# Patient Record
Sex: Male | Born: 1955 | Race: Black or African American | Hispanic: No | Smoking: Former smoker
Health system: Southern US, Community
[De-identification: ages and names within clinical notes are randomized; demographics above are authoritative.]

## PROBLEM LIST (undated history)

## (undated) DIAGNOSIS — G8929 Other chronic pain: Secondary | ICD-10-CM

## (undated) DIAGNOSIS — M1990 Unspecified osteoarthritis, unspecified site: Secondary | ICD-10-CM

## (undated) DIAGNOSIS — M25569 Pain in unspecified knee: Secondary | ICD-10-CM

## (undated) DIAGNOSIS — E119 Type 2 diabetes mellitus without complications: Secondary | ICD-10-CM

## (undated) DIAGNOSIS — C801 Malignant (primary) neoplasm, unspecified: Secondary | ICD-10-CM

## (undated) DIAGNOSIS — M545 Low back pain, unspecified: Secondary | ICD-10-CM

## (undated) DIAGNOSIS — I10 Essential (primary) hypertension: Secondary | ICD-10-CM

## (undated) HISTORY — DX: Type 2 diabetes mellitus without complications: E11.9

## (undated) HISTORY — PX: JOINT REPLACEMENT: SHX530

---

## 2006-03-15 HISTORY — PX: KNEE SURGERY: SHX244

## 2006-05-17 ENCOUNTER — Ambulatory Visit (HOSPITAL_COMMUNITY): Admission: RE | Admit: 2006-05-17 | Discharge: 2006-05-17 | Payer: Self-pay | Admitting: Orthopedic Surgery

## 2007-02-28 ENCOUNTER — Emergency Department (HOSPITAL_COMMUNITY): Admission: EM | Admit: 2007-02-28 | Discharge: 2007-02-28 | Payer: Self-pay | Admitting: Emergency Medicine

## 2009-03-04 ENCOUNTER — Emergency Department (HOSPITAL_COMMUNITY): Admission: EM | Admit: 2009-03-04 | Discharge: 2009-03-04 | Payer: Self-pay | Admitting: Emergency Medicine

## 2009-11-11 ENCOUNTER — Emergency Department (HOSPITAL_COMMUNITY): Admission: EM | Admit: 2009-11-11 | Discharge: 2009-11-11 | Payer: Self-pay | Admitting: Family Medicine

## 2009-11-19 ENCOUNTER — Emergency Department (HOSPITAL_COMMUNITY): Admission: EM | Admit: 2009-11-19 | Discharge: 2009-11-19 | Payer: Self-pay | Admitting: Family Medicine

## 2009-11-27 ENCOUNTER — Ambulatory Visit (HOSPITAL_COMMUNITY): Admission: RE | Admit: 2009-11-27 | Discharge: 2009-11-27 | Payer: Self-pay | Admitting: Internal Medicine

## 2010-04-05 ENCOUNTER — Encounter: Payer: Self-pay | Admitting: Neurology

## 2010-04-05 ENCOUNTER — Encounter: Payer: Self-pay | Admitting: Orthopedic Surgery

## 2010-06-15 LAB — POCT I-STAT, CHEM 8
Chloride: 103 mEq/L (ref 96–112)
Creatinine, Ser: 1.1 mg/dL (ref 0.4–1.5)
Hemoglobin: 16 g/dL (ref 13.0–17.0)
TCO2: 30 mmol/L (ref 0–100)

## 2010-07-31 NOTE — Op Note (Signed)
Eric Macdonald, Eric Macdonald                ACCOUNT NO.:  [id number]   MEDICAL RECORD NO.:  [id number]          PATIENT TYPE:  AMB   LOCATION:  DAY                          FACILITY:  [facility]   PHYSICIAN:  Georges Lynch. Gioffre, M.D.DATE OF BIRTH:  Apr 14, 1955   DATE OF PROCEDURE:  05/17/2006  DATE OF DISCHARGE:                               OPERATIVE REPORT   SURGEON:  Dr. Darrelyn Hillock.   ASSISTANT:  Nurse.   PREOPERATIVE DIAGNOSES:  1. Degenerative arthritis, right knee.  2. 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   POSTOPERATIVE DIAGNOSES:  1. Degenerative arthritis, right knee.  2. 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   OPERATION:  1. Diagnostic arthroscopy, right knee.  2. Medial meniscectomy, right knee.  3. Synovectomy of suprapatellar pouch, right knee.   PROCEDURE:  Under general anesthesia, routine orthopedic prepping and  draping of the right lower extremity was carried out.  At this time, a  small punctate incision made in the suprapatellar pouch.  Inflow cannula  was inserted, and the knee was distended with saline.  Another small  punctate incision made in the anterior lateral joint.  The arthroscope  was entered from the lateral approach, and a complete diagnostic  arthroscopy was carried out.  He had rather significant synovitis in the  suprapatellar pouch.  I introduced the ArthroCare and did a synovectomy.  I then went down and examined the lateral joint space.  It was intact.  The cruciates were intact.  The medial joint was the main problem.  He  had a degenerative tear of posterior horn of the medial meniscus.  I  introduced a shaver suction device and the ArthroCare and did a partial  medial meniscectomy.  Following that, I probed the meniscus, and the  remainder meniscus was intact.  At this time, I noted a large defect.  It was a chondral defect in the medial tibial plateau.  He had an  obvious  arthritic changes in his medial joint space.  I thoroughly  irrigated out the area and reexamined the knee.  There were no other  issues noted.  I then removed all of the fluid closed and all three  punctate incisions with 3-0 nylon suture.  I injected 30 mL of 1/2%  Marcaine with epinephrine into the knee joint.  Sterile Neosporin and  bundle dressing were applied.  The patient left the operative room in  satisfactory condition.  Note, prior to surgery, he had 2 g of IV Ancef.  Postoperative, he will come in the office in 10-12 days for suture  removal, and we will start him on physical therapy.   Also, postoperatively, he will be on:  1. Percocet 10/650 one every 4 hours p.r.n. for pain.  2. He will be on aspirin 325 mg b.i.d. today and b.i.d. for 2 weeks.           ______________________________  Georges Lynch Darrelyn Hillock, M.D.     RAG/MEDQ  D:  05/17/2006  T:  05/17/2006  Job:  [id number]

## 2011-03-10 ENCOUNTER — Encounter (HOSPITAL_COMMUNITY): Payer: Self-pay

## 2011-03-19 ENCOUNTER — Inpatient Hospital Stay (HOSPITAL_COMMUNITY): Admission: RE | Admit: 2011-03-19 | Payer: Self-pay | Source: Ambulatory Visit

## 2011-03-24 ENCOUNTER — Encounter (HOSPITAL_COMMUNITY): Admission: RE | Payer: Self-pay | Source: Ambulatory Visit

## 2011-03-24 ENCOUNTER — Ambulatory Visit (HOSPITAL_COMMUNITY)
Admission: RE | Admit: 2011-03-24 | Payer: BC Managed Care – PPO | Source: Ambulatory Visit | Admitting: Orthopedic Surgery

## 2011-03-24 SURGERY — ARTHROPLASTY, KNEE, TOTAL
Anesthesia: General | Laterality: Right

## 2011-03-28 ENCOUNTER — Emergency Department (HOSPITAL_COMMUNITY)
Admission: EM | Admit: 2011-03-28 | Discharge: 2011-03-28 | Disposition: A | Discharge status: Home / Self Care | Payer: BC Managed Care – PPO | Attending: Emergency Medicine | Admitting: Emergency Medicine

## 2011-03-28 ENCOUNTER — Encounter (HOSPITAL_COMMUNITY): Payer: Self-pay | Admitting: Emergency Medicine

## 2011-03-28 DIAGNOSIS — M25561 Pain in right knee: Secondary | ICD-10-CM

## 2011-03-28 DIAGNOSIS — Z79899 Other long term (current) drug therapy: Secondary | ICD-10-CM | POA: Insufficient documentation

## 2011-03-28 DIAGNOSIS — I10 Essential (primary) hypertension: Secondary | ICD-10-CM | POA: Insufficient documentation

## 2011-03-28 DIAGNOSIS — M25569 Pain in unspecified knee: Secondary | ICD-10-CM | POA: Insufficient documentation

## 2011-03-28 HISTORY — DX: Pain in unspecified knee: M25.569

## 2011-03-28 HISTORY — DX: Essential (primary) hypertension: I10

## 2011-03-28 MED ORDER — PREDNISONE 50 MG PO TABS: 50.0000 mg | ORAL_TABLET | Freq: Every day | ORAL | Status: AC

## 2011-03-28 MED ORDER — METHYLPREDNISOLONE SODIUM SUCC 125 MG IJ SOLR
125.0000 mg | Freq: Once | INTRAMUSCULAR | Status: AC
  Administered 2011-03-28: 125 mg via INTRAVENOUS
  Filled 2011-03-28: qty 2

## 2011-03-28 MED ORDER — HYDROMORPHONE HCL PF 1 MG/ML IJ SOLN
1.0000 mg | Freq: Once | INTRAMUSCULAR | Status: AC
  Administered 2011-03-28: 1 mg via INTRAVENOUS
  Filled 2011-03-28: qty 1

## 2011-03-28 MED ORDER — OXYCODONE-ACETAMINOPHEN 5-325 MG PO TABS: 2.0000 | ORAL_TABLET | ORAL | Status: AC | PRN

## 2011-03-28 MED ORDER — SODIUM CHLORIDE 0.9 % IV BOLUS (SEPSIS): 500.0000 mL | Freq: Once | INTRAVENOUS | Status: DC

## 2011-03-28 MED ORDER — HYDROMORPHONE HCL PF 2 MG/ML IJ SOLN
2.0000 mg | Freq: Once | INTRAMUSCULAR | Status: AC
  Administered 2011-03-28: 2 mg via INTRAVENOUS
  Filled 2011-03-28: qty 1

## 2011-03-28 MED ORDER — ONDANSETRON HCL 4 MG/2ML IJ SOLN
4.0000 mg | Freq: Once | INTRAMUSCULAR | Status: AC
  Administered 2011-03-28: 4 mg via INTRAVENOUS
  Filled 2011-03-28: qty 2

## 2011-03-28 NOTE — ED Notes (Signed)
MD at bedside. 

## 2011-03-28 NOTE — ED Notes (Signed)
Ortho at bedside.

## 2011-03-28 NOTE — ED Notes (Signed)
Pt c/o chronic R knee pain.  Scheduled for surgery January 9th but it was rescheduled due to high BP per PCP.  Surgery is now scheduled for Feb. 20th.

## 2011-03-28 NOTE — ED Provider Notes (Signed)
History     CSN: [id number]  Arrival date & time 03/28/11  1456   First MD Initiated Contact with Patient 03/28/11 1542      Chief Complaint  Patient presents with  . Knee Pain    (Consider location/radiation/quality/duration/timing/severity/associated sxs/prior treatment) HPI.... patient was recently scheduled for a total knee replacement on the right side by Dr. Mckinley Jewel. Surgery was canceled at the last minute secondary to hypertension issues. Patient is in severe pain and he is extremely uncomfortable. No radiation of pain. Pain is severe. Movement makes it worse. Nothing makes it better. It is aching and deep  Past Medical History  Diagnosis Date  . Knee pain   . Hypertension     Past Surgical History  Procedure Date  . Knee surgery     No family history on file.  History  Substance Use Topics  . Smoking status: Former Games developer  . Smokeless tobacco: Not on file  . Alcohol Use: No      Review of Systems  All other systems reviewed and are negative.    Allergies  Review of patient's allergies indicates no known allergies.  Home Medications   Current Outpatient Rx  Name Route Sig Dispense Refill  . CLONIDINE HCL 0.1 MG PO TABS Oral Take 0.1 mg by mouth 2 (two) times daily.     Marland Kitchen DIPHENHYDRAMINE HCL 25 MG PO TABS Oral Take 25 mg by mouth daily as needed. For itching and allergies.     Marland Kitchen HYDROCHLOROTHIAZIDE 25 MG PO TABS Oral Take 25 mg by mouth daily.     Marland Kitchen HYDROCODONE-ACETAMINOPHEN 10-325 MG PO TABS Oral Take 2 tablets by mouth every 6 (six) hours as needed. For pain.    Marland Kitchen NAPHAZOLINE-PHENIRAMINE 0.025-0.3 % OP SOLN Both Eyes Place 1 drop into both eyes daily as needed. For red eyes.     Marland Kitchen POTASSIUM CHLORIDE ER 10 MEQ PO TBCR Oral Take 10 mEq by mouth daily.     . TRAZODONE HCL 150 MG PO TABS Oral Take 150 mg by mouth at bedtime.    . OXYCODONE-ACETAMINOPHEN 5-325 MG PO TABS Oral Take 2 tablets by mouth every 4 (four) hours as needed for pain. 40 tablet  0  . PREDNISONE 50 MG PO TABS Oral Take 1 tablet (50 mg total) by mouth daily. 10 tablet 1    BP 124/89  Pulse 74  Temp(Src) 98.6 F (37 C) (Oral)  Resp 20  SpO2 98%  Physical Exam  Nursing note and vitals reviewed. Constitutional: He is oriented to person, place, and time. He appears well-developed and well-nourished.  HENT:  Head: Normocephalic.  Musculoskeletal:       Right knee: Generalized Tenderness.  Pain with range of motion.  Neurological: He is alert and oriented to person, place, and time.  Skin: Skin is warm and dry.  Psychiatric: He has a normal mood and affect.    ED Course  Procedures (including critical care time)  Labs Reviewed - No data to display No results found.   1. Right knee pain       MDM  Patient is in severe pain. Pain management in ED. Discuss case with orthopedic doctor on call. Patient's wife is an Charity fundraiser. They will call the office in the morning for followup. Prescriptions for narcotics and prednisone given        Donnetta Hutching, MD 03/28/11 1900

## 2011-03-28 NOTE — Progress Notes (Signed)
Orthopedic Tech Progress Note Patient Details:  Eric Macdonald [birth year] [id number]  Other Ortho Devices Type of Ortho Device: Knee Immobilizer Ortho Device Location: right knee Ortho Device Interventions: Application   Nikki Dom 03/28/2011, 7:26 PM

## 2011-03-28 NOTE — ED Notes (Signed)
Ortho tech paged for knee immobilzer

## 2011-03-31 ENCOUNTER — Encounter (HOSPITAL_COMMUNITY): Payer: Self-pay | Admitting: Emergency Medicine

## 2011-03-31 ENCOUNTER — Emergency Department (HOSPITAL_COMMUNITY)
Admission: EM | Admit: 2011-03-31 | Discharge: 2011-03-31 | Disposition: A | Discharge status: Home / Self Care | Payer: BC Managed Care – PPO | Attending: Emergency Medicine | Admitting: Emergency Medicine

## 2011-03-31 DIAGNOSIS — M25569 Pain in unspecified knee: Secondary | ICD-10-CM | POA: Insufficient documentation

## 2011-03-31 DIAGNOSIS — M25469 Effusion, unspecified knee: Secondary | ICD-10-CM | POA: Insufficient documentation

## 2011-03-31 DIAGNOSIS — I10 Essential (primary) hypertension: Secondary | ICD-10-CM | POA: Insufficient documentation

## 2011-03-31 DIAGNOSIS — M1710 Unilateral primary osteoarthritis, unspecified knee: Secondary | ICD-10-CM | POA: Insufficient documentation

## 2011-03-31 DIAGNOSIS — Z79899 Other long term (current) drug therapy: Secondary | ICD-10-CM | POA: Insufficient documentation

## 2011-03-31 DIAGNOSIS — M1990 Unspecified osteoarthritis, unspecified site: Secondary | ICD-10-CM

## 2011-03-31 MED ORDER — HYDROCODONE-ACETAMINOPHEN 10-325 MG PO TABS: 1.0000 | ORAL_TABLET | Freq: Four times a day (QID) | ORAL | Status: DC | PRN

## 2011-03-31 MED ORDER — HYDROCODONE-ACETAMINOPHEN 10-325 MG PO TABS: 1.0000 | ORAL_TABLET | Freq: Four times a day (QID) | ORAL | Status: AC | PRN

## 2011-03-31 MED ORDER — HYDROMORPHONE HCL PF 1 MG/ML IJ SOLN
1.0000 mg | Freq: Once | INTRAMUSCULAR | Status: AC
  Administered 2011-03-31: 1 mg via INTRAMUSCULAR
  Filled 2011-03-31: qty 1

## 2011-03-31 NOTE — ED Notes (Signed)
Pt returns to ED for increased R knee pain.  Seen in ED on Sunday for same.  Wife states they have called orthopedic x 5 since Monday without response.  Knee surgery scheduled for February 20th.  R knee immobilizer on.

## 2011-03-31 NOTE — ED Provider Notes (Signed)
History     CSN: [id number]  Arrival date & time 03/31/11  7829   First MD Initiated Contact with Patient 03/31/11 2222      Chief Complaint  Patient presents with  . Knee Pain    (Consider location/radiation/quality/duration/timing/severity/associated sxs/prior treatment) Patient is a 56 y.o. male presenting with knee pain. The history is provided by the patient.  Knee Pain This is a recurrent problem. The problem occurs constantly. The problem has been gradually worsening. Pertinent negatives include no chills or fever. Associated symptoms comments: He is scheduled for knee replacement surgery by Dr. Eulah Pont that has been delayed. He denies new injury. . The symptoms are aggravated by walking and standing.    Past Medical History  Diagnosis Date  . Knee pain   . Hypertension     Past Surgical History  Procedure Date  . Knee surgery     No family history on file.  History  Substance Use Topics  . Smoking status: Former Games developer  . Smokeless tobacco: Not on file  . Alcohol Use: No      Review of Systems  Constitutional: Negative for fever and chills.  HENT: Negative.   Respiratory: Negative.   Cardiovascular: Negative.   Gastrointestinal: Negative.   Musculoskeletal: Negative.        See HPI  Skin: Negative.   Neurological: Negative.     Allergies  Review of patient's allergies indicates no known allergies.  Home Medications   Current Outpatient Rx  Name Route Sig Dispense Refill  . CLONIDINE HCL 0.1 MG PO TABS Oral Take 0.1 mg by mouth 2 (two) times daily.     Marland Kitchen DIPHENHYDRAMINE HCL 25 MG PO TABS Oral Take 25 mg by mouth daily as needed. For itching and allergies.     Marland Kitchen HYDROCHLOROTHIAZIDE 25 MG PO TABS Oral Take 25 mg by mouth daily.     Marland Kitchen HYDROCODONE-ACETAMINOPHEN 10-325 MG PO TABS Oral Take 2 tablets by mouth every 6 (six) hours as needed. For pain.    Marland Kitchen NAPHAZOLINE-PHENIRAMINE 0.025-0.3 % OP SOLN Both Eyes Place 1 drop into both eyes daily as needed.  For red eyes.     . OXYCODONE-ACETAMINOPHEN 5-325 MG PO TABS Oral Take 2 tablets by mouth every 4 (four) hours as needed for pain. 40 tablet 0  . POTASSIUM CHLORIDE ER 10 MEQ PO TBCR Oral Take 10 mEq by mouth daily.     Marland Kitchen PREDNISONE 50 MG PO TABS Oral Take 1 tablet (50 mg total) by mouth daily. 10 tablet 1  . TRAZODONE HCL 150 MG PO TABS Oral Take 150 mg by mouth at bedtime.      BP 134/89  Pulse 70  Temp(Src) 97.3 F (36.3 C) (Oral)  Resp 20  SpO2 96%  Physical Exam  Constitutional: He is oriented to person, place, and time. He appears well-developed and well-nourished.  Neck: Normal range of motion.  Pulmonary/Chest: Effort normal.  Musculoskeletal: Normal range of motion.       Right knee mildly swollen. No redness or warmth. No gross effusion. Intact neurovascular exam.   Neurological: He is alert and oriented to person, place, and time.  Skin: Skin is warm and dry.  Psychiatric: He has a normal mood and affect.    ED Course  Procedures (including critical care time)  Labs Reviewed - No data to display No results found.   No diagnosis found.    MDM  Known arthritis due for surgery. No suspicion for sepsis or fracture.  Rodena Medin, PA-C 03/31/11 2301

## 2011-03-31 NOTE — ED Notes (Signed)
Wife called, stating patient was in a lot of pain. Advised to return. Could not contact orthopedic doctor.

## 2011-03-31 NOTE — ED Provider Notes (Signed)
Medical screening examination/treatment/procedure(s) were performed by non-physician practitioner and as supervising physician I was immediately available for consultation/collaboration.  Ova Meegan R. Andrell Bergeson, MD 03/31/11 2348 

## 2011-03-31 NOTE — Progress Notes (Signed)
Orthopedic Tech Progress Note Patient Details:  [name] 1955-03-21 [id number]  Other Ortho Devices Type of Ortho Device: Crutches Ortho Device Interventions: Application   Kier Smead 03/31/2011, 11:03 PM

## 2011-04-25 ENCOUNTER — Emergency Department (HOSPITAL_COMMUNITY)
Admission: EM | Admit: 2011-04-25 | Discharge: 2011-04-25 | Disposition: A | Discharge status: Home / Self Care | Payer: BC Managed Care – PPO | Attending: Emergency Medicine | Admitting: Emergency Medicine

## 2011-04-25 ENCOUNTER — Encounter (HOSPITAL_COMMUNITY): Payer: Self-pay | Admitting: *Deleted

## 2011-04-25 DIAGNOSIS — M25561 Pain in right knee: Secondary | ICD-10-CM

## 2011-04-25 DIAGNOSIS — M25569 Pain in unspecified knee: Secondary | ICD-10-CM | POA: Insufficient documentation

## 2011-04-25 DIAGNOSIS — Z79899 Other long term (current) drug therapy: Secondary | ICD-10-CM | POA: Insufficient documentation

## 2011-04-25 DIAGNOSIS — I10 Essential (primary) hypertension: Secondary | ICD-10-CM | POA: Insufficient documentation

## 2011-04-25 DIAGNOSIS — M25469 Effusion, unspecified knee: Secondary | ICD-10-CM | POA: Insufficient documentation

## 2011-04-25 NOTE — ED Notes (Signed)
Rt knee pain for 2 years and he is scheduled for a knee replacement feb 22nd.  He says he wants the fluid drawn off

## 2011-04-25 NOTE — ED Notes (Signed)
Pt states that he had orthoscopic knee surgery on this R knee in the past for a torn meniscus. Pt states that his knee started hurting this morning. Pt states that it is always swollen. Pain 10/10

## 2011-04-25 NOTE — ED Provider Notes (Signed)
History     CSN: [id number]  Arrival date & time 04/25/11  2225   First MD Initiated Contact with Patient 04/25/11 2302      Chief Complaint  Patient presents with  . Knee Pain    (Consider location/radiation/quality/duration/timing/severity/associated sxs/prior treatment) HPI Comments: Patient is scheduled for knee replacement surgery on the right by Dr. Eulah Pont 05/05/11  He is requesting fluid be removed from his knee  Patient is a 56 y.o. male presenting with knee pain. The history is provided by the patient.  Knee Pain This is a chronic problem. Associated symptoms include joint swelling.    Past Medical History  Diagnosis Date  . Knee pain   . Hypertension     Past Surgical History  Procedure Date  . Knee surgery     History reviewed. No pertinent family history.  History  Substance Use Topics  . Smoking status: Former Games developer  . Smokeless tobacco: Not on file  . Alcohol Use: No      Review of Systems  Cardiovascular: Negative for leg swelling.  Musculoskeletal: Positive for joint swelling. Negative for back pain.    Allergies  Review of patient's allergies indicates no known allergies.  Home Medications   Current Outpatient Rx  Name Route Sig Dispense Refill  . CLONIDINE HCL 0.1 MG PO TABS Oral Take 0.1 mg by mouth 2 (two) times daily.     Marland Kitchen HYDROCHLOROTHIAZIDE 25 MG PO TABS Oral Take 25 mg by mouth daily.     Marland Kitchen HYDROCODONE-ACETAMINOPHEN 10-325 MG PO TABS Oral Take 1 tablet by mouth every 6 (six) hours as needed. For pain.    Marland Kitchen NAPHAZOLINE-PHENIRAMINE 0.025-0.3 % OP SOLN Both Eyes Place 1 drop into both eyes daily as needed. For red eyes.    Marland Kitchen POTASSIUM CHLORIDE ER 10 MEQ PO TBCR Oral Take 10 mEq by mouth daily.     . TRAZODONE HCL 150 MG PO TABS Oral Take 150 mg by mouth at bedtime.      BP 131/94  Pulse 103  Temp(Src) 98 F (36.7 C) (Oral)  Resp 20  SpO2 100%  Physical Exam  Constitutional: He is oriented to person, place, and time. He  appears well-developed and well-nourished.  HENT:  Head: Normocephalic.  Eyes: Pupils are equal, round, and reactive to light.  Cardiovascular: Normal rate.   Pulmonary/Chest: Effort normal.  Musculoskeletal:       Minimal swelling to medial right knee.  Full range of motion, crepitus with movement.  No calf swelling.  Distal pulses strong  Neurological: He is alert and oriented to person, place, and time.  Skin: Skin is warm and dry.    ED Course  Procedures (including critical care time)  Labs Reviewed - No data to display No results found.   1. Knee pain, right       MDM  Arthritis, right knee        Arman Filter, NP 04/25/11 2319

## 2011-04-26 NOTE — ED Provider Notes (Signed)
Medical screening examination/treatment/procedure(s) were performed by non-physician practitioner and as supervising physician I was immediately available for consultation/collaboration.  Doloros Kwolek K Nillie Bartolotta-Rasch, MD 04/26/11 0049 

## 2011-04-28 ENCOUNTER — Encounter (HOSPITAL_COMMUNITY): Payer: Self-pay

## 2011-04-28 ENCOUNTER — Ambulatory Visit (HOSPITAL_COMMUNITY)
Admission: RE | Admit: 2011-04-28 | Discharge: 2011-04-28 | Disposition: A | Discharge status: Home / Self Care | Payer: BC Managed Care – PPO | Source: Ambulatory Visit | Attending: Anesthesiology | Admitting: Anesthesiology

## 2011-04-28 ENCOUNTER — Encounter (HOSPITAL_COMMUNITY)
Admission: RE | Admit: 2011-04-28 | Discharge: 2011-04-28 | Disposition: A | Discharge status: Home / Self Care | Payer: BC Managed Care – PPO | Source: Ambulatory Visit | Attending: Orthopedic Surgery | Admitting: Orthopedic Surgery

## 2011-04-28 ENCOUNTER — Other Ambulatory Visit: Payer: Self-pay

## 2011-04-28 DIAGNOSIS — Z01812 Encounter for preprocedural laboratory examination: Secondary | ICD-10-CM | POA: Insufficient documentation

## 2011-04-28 DIAGNOSIS — Z01818 Encounter for other preprocedural examination: Secondary | ICD-10-CM | POA: Insufficient documentation

## 2011-04-28 HISTORY — DX: Unspecified osteoarthritis, unspecified site: M19.90

## 2011-04-28 LAB — APTT: aPTT: 34 seconds (ref 24–37)

## 2011-04-28 LAB — CBC
HCT: 39.9 % (ref 39.0–52.0)
Hemoglobin: 13.7 g/dL (ref 13.0–17.0)
WBC: 5.7 10*3/uL (ref 4.0–10.5)

## 2011-04-28 LAB — URINE MICROSCOPIC-ADD ON

## 2011-04-28 LAB — COMPREHENSIVE METABOLIC PANEL
Alkaline Phosphatase: 37 U/L — ABNORMAL LOW (ref 39–117)
BUN: 9 mg/dL (ref 6–23)
CO2: 28 mEq/L (ref 19–32)
Chloride: 101 mEq/L (ref 96–112)
GFR calc Af Amer: >90 mL/min (ref >90)
GFR calc non Af Amer: 82 mL/min — ABNORMAL LOW (ref >90)
Glucose, Bld: 102 mg/dL — ABNORMAL HIGH (ref 70–99)
Potassium: 3.4 mEq/L — ABNORMAL LOW (ref 3.5–5.1)
Total Bilirubin: 0.3 mg/dL (ref 0.3–1.2)

## 2011-04-28 LAB — URINALYSIS, ROUTINE W REFLEX MICROSCOPIC
Bilirubin Urine: NEGATIVE
Hgb urine dipstick: NEGATIVE
Protein, ur: NEGATIVE mg/dL
Specific Gravity, Urine: 1.01 (ref 1.005–1.030)
Urobilinogen, UA: 0.2 mg/dL (ref 0.0–1.0)

## 2011-04-28 LAB — PROTIME-INR: Prothrombin Time: 12.7 seconds (ref 11.6–15.2)

## 2011-04-28 LAB — SURGICAL PCR SCREEN: MRSA, PCR: NEGATIVE

## 2011-04-28 NOTE — Pre-Procedure Instructions (Addendum)
20 Eric Macdonald  04/28/2011   Your procedure is scheduled on: 2.20.13  Report to [facility] at 800 AM.  Call this number if you have problems the morning of surgery: [phone]   Remember:   Do not eat food:After Midnight.  May have clear liquids: up to 4 Hours before arrival  400 am.  Clear liquids include soda, tea, black coffee, apple or grape juice, broth.  Take these medicines the morning of surgery with A SIP OF WATER:hydrocodone, clonidine  STOP any aspirin, nsaids, herbal meds blood thinners   Do not wear jewelry, make-up or nail polish.  Do not wear lotions, powders, or perfumes. You may wear deodorant.  Do not shave 48 hours prior to surgery.  Do not bring valuables to the hospital.  Contacts, dentures or bridgework may not be worn into surgery.  Leave suitcase in the car. After surgery it may be brought to your room.  For patients admitted to the hospital, checkout time is 11:00 AM the day of discharge.   Patients discharged the day of surgery will not be allowed to drive home.  Name and phone number of your driver:robin [phone]   Special Instructions: Incentive Spirometry - Practice and bring it with you on the day of surgery. and CHG Shower Use Special Wash: 1/2 bottle night before surgery and 1/2 bottle morning of surgery.   Please read over the following fact sheets that you were given: Pain Booklet, Coughing and Deep Breathing, Blood Transfusion Information, Total Joint Packet, MRSA Information and Surgical Site Infection Prevention

## 2011-04-28 NOTE — Progress Notes (Signed)
Blood refusal faxed to blood bank and dr Eulah Pont

## 2011-05-03 NOTE — H&P (Signed)
  [facility] [address]   [address] [place], [place] [address] [phone] A Division of [facility]  Loreta Ave, M.D.     Robert A. Thurston Hole, M.D.     Lunette Stands, M.D. Eulas Post, M.D.    Buford Dresser, M.D. Estell Harpin, M.D. Ralene Cork, D.O.          Genene Churn. Barry Dienes, PA-C            Kirstin A. Shepperson, PA-C Janace Litten, OPA-C   RE: Eric, Macdonald   [id number]      DOB: 1955-05-23 PROGRESS NOTE: 04-27-11 Chief complaint: right knee pain. History of present illness: 56 year old black male with end stage degenerative joint disease right knee chronic pain returns. Symptoms are unchanged. He wants to proceed with total knee replacement as scheduled next week. We received clearance from Dr. Manus Gunning.  Patient has pain management issues related to his lumbar spine. Lumbar MRI 02/22/11 showed L4-5 small disc extrusion left neuroforamen on prior study has resolved. Left L4 nerve root exits without impingement. Tiny broad base bulge of disc without significant impingement and mild bilateral facet arthritis. L3-4 tiny broad base disc bulge with no impingement. He has ongoing low back pain and left lower extremity radiculopathy worse with bending and lifting. He's been getting narcotic pain medications from Dr. Della Goo. Current medications: Clonidine HCL HCTZ potassium chloride Cialis, Trazodone, Hydrocodone/acetaminophen.  No known drug allergies. Past medical/surgical history: hypertension hypercholesterolemia lumbar degenerative disc disease right knee arthroscopy 2008. Family history: positive lung cancer hepatitis breast cancer hypertension. Social history: he's married has 3 children. He's a Jehovah's Witness. Quit smoking in 1988.  Review of systems: denies current cardiac pulmonary GI GU issues. No fever or chills.  EXAMINATION: Temp 98.6. Blood pressure 169/110. Height 5'11" weight 241 pounds.  Alert and oriented x3 in no acute distress. Antalgic gait. Head normal cephalic atraumatic. PERRLA EOMI. Lungs CTA bilaterally no wheezes. Heart regular rate and rhythm tachycardic no murmurs. Abdomen round non-distended. NBS x4 soft non-tender. Right knee 2+ effusion positive crepitus joint line tenderness ligaments stable. Calf non-tender neurovascularly intact. Skin warm and dry.   IMPRESSION: End stage degenerative joint disease right knee and chronic pain. Failed conservative treatment.  DISPOSITION: We'll proceed with right total knee replacement as scheduled. Dr. Eulah Pont discussed post-op pain management with patient. Recommend appointment with Dr. Ophelia Charter to discuss lumbar spine. All questions answered.  Loreta Ave, M.D.  Electronically verified by Loreta Ave, M.D. DFM(JMO):kh D 04-27-11 T 04-28-11

## 2011-05-04 MED ORDER — CEFAZOLIN SODIUM-DEXTROSE 2-3 GM-% IV SOLR
2.0000 g | INTRAVENOUS | Status: AC
  Administered 2011-05-05: 2 g via INTRAVENOUS
  Filled 2011-05-04: qty 50

## 2011-05-05 ENCOUNTER — Encounter (HOSPITAL_COMMUNITY): Payer: Self-pay | Admitting: *Deleted

## 2011-05-05 ENCOUNTER — Encounter (HOSPITAL_COMMUNITY)
Admission: RE | Disposition: A | Discharge status: Home / Self Care | Payer: Self-pay | Source: Ambulatory Visit | Attending: Orthopedic Surgery

## 2011-05-05 ENCOUNTER — Inpatient Hospital Stay (HOSPITAL_COMMUNITY)
Admission: RE | Admit: 2011-05-05 | Discharge: 2011-05-07 | DRG: 209 | Disposition: A | Discharge status: Home / Self Care | Payer: BC Managed Care – PPO | Source: Ambulatory Visit | Attending: Orthopedic Surgery | Admitting: Orthopedic Surgery

## 2011-05-05 ENCOUNTER — Ambulatory Visit (HOSPITAL_COMMUNITY): Payer: BC Managed Care – PPO

## 2011-05-05 ENCOUNTER — Ambulatory Visit (HOSPITAL_COMMUNITY): Payer: BC Managed Care – PPO | Admitting: Anesthesiology

## 2011-05-05 ENCOUNTER — Encounter (HOSPITAL_COMMUNITY): Payer: Self-pay | Admitting: Anesthesiology

## 2011-05-05 DIAGNOSIS — I10 Essential (primary) hypertension: Secondary | ICD-10-CM | POA: Diagnosis present

## 2011-05-05 DIAGNOSIS — M1710 Unilateral primary osteoarthritis, unspecified knee: Principal | ICD-10-CM | POA: Diagnosis present

## 2011-05-05 DIAGNOSIS — Z471 Aftercare following joint replacement surgery: Secondary | ICD-10-CM

## 2011-05-05 HISTORY — PX: TOTAL KNEE ARTHROPLASTY: SHX125

## 2011-05-05 SURGERY — ARTHROPLASTY, KNEE, TOTAL
Anesthesia: Regional | Site: Knee | Laterality: Right | Wound class: Clean

## 2011-05-05 MED ORDER — METHOCARBAMOL 100 MG/ML IJ SOLN
500.0000 mg | Freq: Four times a day (QID) | INTRAVENOUS | Status: DC | PRN
  Administered 2011-05-05: 500 mg via INTRAVENOUS
  Filled 2011-05-05: qty 5

## 2011-05-05 MED ORDER — CLONIDINE HCL 0.1 MG PO TABS
0.1000 mg | ORAL_TABLET | Freq: Two times a day (BID) | ORAL | Status: DC
  Administered 2011-05-05 – 2011-05-07 (×3): 0.1 mg via ORAL
  Filled 2011-05-05 (×7): qty 1

## 2011-05-05 MED ORDER — TEMAZEPAM 15 MG PO CAPS
15.0000 mg | ORAL_CAPSULE | Freq: Every evening | ORAL | Status: DC | PRN
  Filled 2011-05-05: qty 1

## 2011-05-05 MED ORDER — OXYCODONE-ACETAMINOPHEN 5-325 MG PO TABS: 1.0000 | ORAL_TABLET | ORAL | Status: DC | PRN

## 2011-05-05 MED ORDER — MORPHINE SULFATE (PF) 1 MG/ML IV SOLN
INTRAVENOUS | Status: AC
  Administered 2011-05-05: 23:00:00
  Filled 2011-05-05: qty 25

## 2011-05-05 MED ORDER — MORPHINE SULFATE (PF) 1 MG/ML IV SOLN
INTRAVENOUS | Status: DC
  Administered 2011-05-05: 13:00:00 via INTRAVENOUS
  Administered 2011-05-05: 13.5 mg via INTRAVENOUS
  Administered 2011-05-05: 11.52 mg via INTRAVENOUS
  Administered 2011-05-05: 12 mg via INTRAVENOUS
  Administered 2011-05-06: 13.3 mg via INTRAVENOUS
  Administered 2011-05-06: 10.45 mg via INTRAVENOUS
  Administered 2011-05-06: 11.52 mg via INTRAVENOUS

## 2011-05-05 MED ORDER — TRAZODONE HCL 150 MG PO TABS
150.0000 mg | ORAL_TABLET | Freq: Every day | ORAL | Status: DC
  Administered 2011-05-05 – 2011-05-06 (×2): 150 mg via ORAL
  Filled 2011-05-05 (×3): qty 1

## 2011-05-05 MED ORDER — DIPHENHYDRAMINE HCL 12.5 MG/5ML PO ELIX
12.5000 mg | ORAL_SOLUTION | Freq: Four times a day (QID) | ORAL | Status: DC | PRN
  Filled 2011-05-05: qty 5

## 2011-05-05 MED ORDER — ONDANSETRON HCL 4 MG/2ML IJ SOLN
INTRAMUSCULAR | Status: DC | PRN
  Administered 2011-05-05: 4 mg via INTRAVENOUS

## 2011-05-05 MED ORDER — HETASTARCH-ELECTROLYTES 6 % IV SOLN
INTRAVENOUS | Status: DC | PRN
  Administered 2011-05-05: 11:00:00 via INTRAVENOUS

## 2011-05-05 MED ORDER — WARFARIN VIDEO: Freq: Once | Status: DC

## 2011-05-05 MED ORDER — ENOXAPARIN SODIUM 30 MG/0.3ML ~~LOC~~ SOLN
30.0000 mg | Freq: Two times a day (BID) | SUBCUTANEOUS | Status: DC
  Administered 2011-05-05 – 2011-05-07 (×4): 30 mg via SUBCUTANEOUS
  Filled 2011-05-05 (×6): qty 0.3

## 2011-05-05 MED ORDER — FENTANYL CITRATE 0.05 MG/ML IJ SOLN
50.0000 ug | INTRAMUSCULAR | Status: DC | PRN
  Administered 2011-05-05: 100 ug via INTRAVENOUS

## 2011-05-05 MED ORDER — PROPOFOL 10 MG/ML IV EMUL
INTRAVENOUS | Status: DC | PRN
  Administered 2011-05-05: 200 mg via INTRAVENOUS

## 2011-05-05 MED ORDER — DROPERIDOL 2.5 MG/ML IJ SOLN: 0.6250 mg | INTRAMUSCULAR | Status: DC | PRN

## 2011-05-05 MED ORDER — MORPHINE SULFATE 4 MG/ML IJ SOLN
INTRAMUSCULAR | Status: DC | PRN
  Administered 2011-05-05: 4 mg

## 2011-05-05 MED ORDER — MENTHOL 3 MG MT LOZG: 1.0000 | LOZENGE | OROMUCOSAL | Status: DC | PRN

## 2011-05-05 MED ORDER — ACETAMINOPHEN 325 MG PO TABS: 650.0000 mg | ORAL_TABLET | Freq: Four times a day (QID) | ORAL | Status: DC | PRN

## 2011-05-05 MED ORDER — ONDANSETRON HCL 4 MG/2ML IJ SOLN: 4.0000 mg | Freq: Four times a day (QID) | INTRAMUSCULAR | Status: DC | PRN

## 2011-05-05 MED ORDER — SODIUM CHLORIDE 0.9 % IR SOLN
Status: DC | PRN
  Administered 2011-05-05: 3000 mL

## 2011-05-05 MED ORDER — MIDAZOLAM HCL 2 MG/2ML IJ SOLN
INTRAMUSCULAR | Status: AC
  Filled 2011-05-05: qty 2

## 2011-05-05 MED ORDER — POTASSIUM CHLORIDE IN NACL 20-0.9 MEQ/L-% IV SOLN
INTRAVENOUS | Status: DC
  Administered 2011-05-05 – 2011-05-06 (×2): via INTRAVENOUS
  Filled 2011-05-05 (×7): qty 1000

## 2011-05-05 MED ORDER — ACETAMINOPHEN 650 MG RE SUPP: 650.0000 mg | Freq: Four times a day (QID) | RECTAL | Status: DC | PRN

## 2011-05-05 MED ORDER — ONDANSETRON HCL 4 MG PO TABS: 4.0000 mg | ORAL_TABLET | Freq: Four times a day (QID) | ORAL | Status: DC | PRN

## 2011-05-05 MED ORDER — LACTATED RINGERS IV SOLN
INTRAVENOUS | Status: DC | PRN
  Administered 2011-05-05 (×2): via INTRAVENOUS

## 2011-05-05 MED ORDER — MIDAZOLAM HCL 2 MG/2ML IJ SOLN
1.0000 mg | INTRAMUSCULAR | Status: DC | PRN
  Administered 2011-05-05: 2 mg via INTRAVENOUS

## 2011-05-05 MED ORDER — SODIUM CHLORIDE 0.9 % IJ SOLN: 9.0000 mL | INTRAMUSCULAR | Status: DC | PRN

## 2011-05-05 MED ORDER — FENTANYL CITRATE 0.05 MG/ML IJ SOLN
INTRAMUSCULAR | Status: DC | PRN
  Administered 2011-05-05 (×8): 50 ug via INTRAVENOUS
  Administered 2011-05-05: 100 ug via INTRAVENOUS

## 2011-05-05 MED ORDER — PHENOL 1.4 % MT LIQD
1.0000 | OROMUCOSAL | Status: DC | PRN
  Filled 2011-05-05: qty 177

## 2011-05-05 MED ORDER — BUPIVACAINE-EPINEPHRINE PF 0.5-1:200000 % IJ SOLN
INTRAMUSCULAR | Status: DC | PRN
  Administered 2011-05-05: 150 mg

## 2011-05-05 MED ORDER — NALOXONE HCL 0.4 MG/ML IJ SOLN: 0.4000 mg | INTRAMUSCULAR | Status: DC | PRN

## 2011-05-05 MED ORDER — METHOCARBAMOL 500 MG PO TABS
500.0000 mg | ORAL_TABLET | Freq: Four times a day (QID) | ORAL | Status: DC | PRN
  Administered 2011-05-05 – 2011-05-07 (×4): 500 mg via ORAL
  Filled 2011-05-05 (×6): qty 1

## 2011-05-05 MED ORDER — CEFAZOLIN SODIUM 1-5 GM-% IV SOLN
1.0000 g | Freq: Three times a day (TID) | INTRAVENOUS | Status: AC
  Administered 2011-05-05 – 2011-05-06 (×3): 1 g via INTRAVENOUS
  Filled 2011-05-05 (×3): qty 50

## 2011-05-05 MED ORDER — WARFARIN SODIUM 10 MG PO TABS
10.0000 mg | ORAL_TABLET | Freq: Once | ORAL | Status: AC
  Administered 2011-05-05: 10 mg via ORAL
  Filled 2011-05-05: qty 1

## 2011-05-05 MED ORDER — FENTANYL CITRATE 0.05 MG/ML IJ SOLN
INTRAMUSCULAR | Status: AC
  Filled 2011-05-05: qty 2

## 2011-05-05 MED ORDER — MORPHINE SULFATE (PF) 1 MG/ML IV SOLN
INTRAVENOUS | Status: AC
  Filled 2011-05-05: qty 25

## 2011-05-05 MED ORDER — OLOPATADINE HCL 0.1 % OP SOLN
1.0000 [drp] | Freq: Every day | OPHTHALMIC | Status: DC | PRN
  Filled 2011-05-05: qty 5

## 2011-05-05 MED ORDER — BUPIVACAINE HCL (PF) 0.25 % IJ SOLN
INTRAMUSCULAR | Status: DC | PRN
  Administered 2011-05-05: 30 mL via INTRA_ARTICULAR

## 2011-05-05 MED ORDER — DIPHENHYDRAMINE HCL 50 MG/ML IJ SOLN: 12.5000 mg | Freq: Four times a day (QID) | INTRAMUSCULAR | Status: DC | PRN

## 2011-05-05 MED ORDER — MIDAZOLAM HCL 5 MG/5ML IJ SOLN
INTRAMUSCULAR | Status: DC | PRN
  Administered 2011-05-05: 2 mg via INTRAVENOUS

## 2011-05-05 MED ORDER — NAPHAZOLINE-PHENIRAMINE 0.025-0.3 % OP SOLN: 1.0000 [drp] | Freq: Every day | OPHTHALMIC | Status: DC | PRN

## 2011-05-05 MED ORDER — HYDROCHLOROTHIAZIDE 25 MG PO TABS
25.0000 mg | ORAL_TABLET | Freq: Every day | ORAL | Status: DC
Start: 2011-05-06 — End: 2011-05-07
  Administered 2011-05-06 – 2011-05-07 (×2): 25 mg via ORAL
  Filled 2011-05-05 (×3): qty 1

## 2011-05-05 MED ORDER — COUMADIN BOOK
Freq: Once | Status: AC
  Administered 2011-05-05: 15:00:00
  Filled 2011-05-05: qty 1

## 2011-05-05 MED ORDER — HYDROMORPHONE HCL PF 1 MG/ML IJ SOLN
0.2500 mg | INTRAMUSCULAR | Status: DC | PRN
  Administered 2011-05-05 (×4): 0.5 mg via INTRAVENOUS

## 2011-05-05 SURGICAL SUPPLY — 55 items
BANDAGE ESMARK 6X9 LF (GAUZE/BANDAGES/DRESSINGS) ×1 IMPLANT
BLADE SAG 18X100X1.27 (BLADE) ×4 IMPLANT
BNDG ESMARK 6X9 LF (GAUZE/BANDAGES/DRESSINGS) ×2
BOOTCOVER CLEANROOM LRG (PROTECTIVE WEAR) ×4 IMPLANT
BOWL SMART MIX CTS (DISPOSABLE) ×2 IMPLANT
CEMENT BONE SIMPLEX SPEEDSET (Cement) ×2 IMPLANT
CLOTH BEACON ORANGE TIMEOUT ST (SAFETY) ×2 IMPLANT
COVER BACK TABLE 24X17X13 BIG (DRAPES) ×2 IMPLANT
COVER SURGICAL LIGHT HANDLE (MISCELLANEOUS) ×2 IMPLANT
CUFF TOURNIQUET SINGLE 34IN LL (TOURNIQUET CUFF) ×2 IMPLANT
DRAPE EXTREMITY T 121X128X90 (DRAPE) ×2 IMPLANT
DRAPE PROXIMA HALF (DRAPES) ×2 IMPLANT
DRAPE U-SHAPE 47X51 STRL (DRAPES) ×2 IMPLANT
DRSG PAD ABDOMINAL 8X10 ST (GAUZE/BANDAGES/DRESSINGS) ×2 IMPLANT
DURAPREP 26ML APPLICATOR (WOUND CARE) ×2 IMPLANT
ELECT CAUTERY BLADE 6.4 (BLADE) ×2 IMPLANT
ELECT REM PT RETURN 9FT ADLT (ELECTROSURGICAL) ×2
ELECTRODE REM PT RTRN 9FT ADLT (ELECTROSURGICAL) ×1 IMPLANT
EVACUATOR 1/8 PVC DRAIN (DRAIN) ×2 IMPLANT
FACESHIELD LNG OPTICON STERILE (SAFETY) ×2 IMPLANT
GAUZE XEROFORM 5X9 LF (GAUZE/BANDAGES/DRESSINGS) ×2 IMPLANT
GLOVE BIOGEL PI IND STRL 8 (GLOVE) ×1 IMPLANT
GLOVE BIOGEL PI INDICATOR 8 (GLOVE) ×1
GOWN PREVENTION PLUS XLARGE (GOWN DISPOSABLE) ×4 IMPLANT
GOWN STRL NON-REIN LRG LVL3 (GOWN DISPOSABLE) ×4 IMPLANT
GOWN STRL REIN 2XL XLG LVL4 (GOWN DISPOSABLE) ×2 IMPLANT
HANDPIECE INTERPULSE COAX TIP (DISPOSABLE) ×1
IMMOBILIZER KNEE 22 UNIV (SOFTGOODS) ×2 IMPLANT
IMMOBILIZER KNEE 24 THIGH 36 (MISCELLANEOUS) IMPLANT
IMMOBILIZER KNEE 24 UNIV (MISCELLANEOUS)
KIT BASIN OR (CUSTOM PROCEDURE TRAY) ×2 IMPLANT
KIT ROOM TURNOVER OR (KITS) ×2 IMPLANT
MANIFOLD NEPTUNE II (INSTRUMENTS) ×2 IMPLANT
NS IRRIG 1000ML POUR BTL (IV SOLUTION) ×2 IMPLANT
PACK TOTAL JOINT (CUSTOM PROCEDURE TRAY) ×2 IMPLANT
PAD ARMBOARD 7.5X6 YLW CONV (MISCELLANEOUS) ×4 IMPLANT
PAD CAST 4YDX4 CTTN HI CHSV (CAST SUPPLIES) ×1 IMPLANT
PADDING CAST COTTON 4X4 STRL (CAST SUPPLIES) ×1
PADDING CAST COTTON 6X4 STRL (CAST SUPPLIES) ×2 IMPLANT
PADDING WEBRIL 6 STERILE (GAUZE/BANDAGES/DRESSINGS) ×2 IMPLANT
RUBBERBAND STERILE (MISCELLANEOUS) ×2 IMPLANT
SET HNDPC FAN SPRY TIP SCT (DISPOSABLE) ×1 IMPLANT
SPONGE GAUZE 4X4 12PLY (GAUZE/BANDAGES/DRESSINGS) ×2 IMPLANT
STAPLER VISISTAT 35W (STAPLE) ×2 IMPLANT
SUCTION FRAZIER TIP 10 FR DISP (SUCTIONS) ×2 IMPLANT
SUT VIC AB 1 CTX 36 (SUTURE) ×2
SUT VIC AB 1 CTX36XBRD ANBCTR (SUTURE) ×2 IMPLANT
SUT VIC AB 2-0 CT1 27 (SUTURE) ×2
SUT VIC AB 2-0 CT1 TAPERPNT 27 (SUTURE) ×2 IMPLANT
SYR 30ML LL (SYRINGE) ×2 IMPLANT
SYR 30ML SLIP (SYRINGE) ×2 IMPLANT
TOWEL OR 17X24 6PK STRL BLUE (TOWEL DISPOSABLE) ×2 IMPLANT
TOWEL OR 17X26 10 PK STRL BLUE (TOWEL DISPOSABLE) ×2 IMPLANT
TRAY FOLEY CATH 14FR (SET/KITS/TRAYS/PACK) ×2 IMPLANT
WATER STERILE IRR 1000ML POUR (IV SOLUTION) ×6 IMPLANT

## 2011-05-05 NOTE — Transfer of Care (Signed)
Immediate Anesthesia Transfer of Care Note  Patient: Eric Macdonald  Procedure(s) Performed: Procedure(s) (LRB): TOTAL KNEE ARTHROPLASTY (Right)  Patient Location: PACU  Anesthesia Type: GA combined with regional for post-op pain  Level of Consciousness: awake and alert   Airway & Oxygen Therapy: Patient Spontanous Breathing and Patient connected to nasal cannula oxygen  Post-op Assessment: Report given to PACU RN and Post -op Vital signs reviewed and stable  Post vital signs: Reviewed  Complications: No apparent anesthesia complications

## 2011-05-05 NOTE — Consult Note (Signed)
ANTICOAGULATION CONSULT NOTE - Initial Consult  Pharmacy Consult for Coumadin Indication: VTE prophylaxis  No Known Allergies  Patient Measurements: 113kg 180cm  Vital Signs: Temp: 98.4 F (36.9 C) (02/20 1415) Temp src: Oral (02/20 0753) BP: 115/79 mmHg (02/20 1415) Pulse Rate: 105  (02/20 1415)  Labs: No results found for this basename: HGB:2,HCT:3,PLT:3,APTT:3,LABPROT:3,INR:3,HEPARINUNFRC:3,CREATININE:3,CKTOTAL:3,CKMB:3,TROPONINI:3 in the last 72 hours CrCl is unknown because there is no height on file for the current visit.  Medical History: Past Medical History  Diagnosis Date  . Knee pain   . Hypertension   . Arthritis     Medications:  Prescriptions prior to admission  Medication Sig Dispense Refill  . cloNIDine (CATAPRES) 0.1 MG tablet Take 0.1 mg by mouth 2 (two) times daily.       . hydrochlorothiazide (HYDRODIURIL) 25 MG tablet Take 25 mg by mouth daily.       Marland Kitchen HYDROcodone-acetaminophen (NORCO) 10-325 MG per tablet Take 1 tablet by mouth every 6 (six) hours as needed. For pain.      . naphazoline-pheniramine (NAPHCON-A) 0.025-0.3 % ophthalmic solution Place 1 drop into both eyes daily as needed. For red eyes.      . potassium chloride (K-DUR) 10 MEQ tablet Take 10 mEq by mouth daily.       . traZODone (DESYREL) 150 MG tablet Take 150 mg by mouth at bedtime.       Assessment: 55yom s/p right TKA to begin coumadin for VTE prophylaxis. Baseline INR 0.93. Coumadin score=9.  Goal of Therapy:  INR 2-3   Plan:  1) Coumadin 10mg  x 1 2) Daily INR 3) Coumadin education-book/video 4) Continue lovenox 30mg  q12 until INR therapeutic  Fredrik Rigger 05/05/2011,3:10 PM

## 2011-05-05 NOTE — Progress Notes (Signed)
Orthopedic Tech Progress Note Patient Details:  Eric Macdonald 1955-06-05 [id number]  CPM Right Knee CPM Right Knee: On Right Knee Flexion (Degrees): 60  Right Knee Extension (Degrees): 0    Eric Macdonald 05/05/2011, 3:50 PM Trapeze bar patient helper

## 2011-05-05 NOTE — Progress Notes (Signed)
Orthopedic Tech Progress Note Patient Details:  Eric Macdonald 05-18-1955 [id number]  CPM Right Knee CPM Right Knee: On Right Knee Flexion (Degrees): 60  Right Knee Extension (Degrees): 0    Nikki Dom 05/05/2011, 3:49 PM

## 2011-05-05 NOTE — Brief Op Note (Signed)
05/05/2011  12:38 PM  PATIENT:  Eric Macdonald  56 y.o. male  PRE-OPERATIVE DIAGNOSIS:  Degenerative joint disease, right knee  POST-OPERATIVE DIAGNOSIS:  Degenerative joint disease, right knee  PROCEDURE:  Procedure(s) (LRB): TOTAL KNEE ARTHROPLASTY (Right)  SURGEON:  Surgeon(s) and Role:    * Loreta Ave, MD - Primary  PHYSICIAN ASSISTANT: Zonia Kief M   SPECIMEN:  No Specimen  DISPOSITION OF SPECIMEN:  N/A  COUNTS:  YES  TOURNIQUET:   Total Tourniquet Time Documented: Thigh (Right) - 88 minutes    PATIENT DISPOSITION:  PACU - hemodynamically stable.

## 2011-05-05 NOTE — Anesthesia Postprocedure Evaluation (Signed)
Anesthesia Post Note  Patient: Eric Macdonald  Procedure(s) Performed: Procedure(s) (LRB): TOTAL KNEE ARTHROPLASTY (Right)  Anesthesia type: general  Patient location: PACU  Post pain: Pain level controlled  Post assessment: Patient's Cardiovascular Status Stable  Last Vitals:  Filed Vitals:   05/05/11 1345  BP: 129/85  Pulse: 92  Temp:   Resp: 18    Post vital signs: Reviewed and stable  Level of consciousness: sedated  Complications: No apparent anesthesia complications

## 2011-05-05 NOTE — Plan of Care (Signed)
Problem: Consults Goal: Diagnosis- Total Joint Replacement Primary Total Knee Right     

## 2011-05-05 NOTE — Preoperative (Signed)
Beta Blockers   Reason not to administer Beta Blockers:Not Applicable. No home beta blockers 

## 2011-05-05 NOTE — Progress Notes (Signed)
Orthopedic Tech Progress Note Patient Details:  Eric Macdonald [birth year] [id number]  CPM Right Knee CPM Right Knee: On Right Knee Flexion (Degrees): 60  Right Knee Extension (Degrees): 0    Nikki Dom 05/05/2011, 3:52 PM Viewed order from rn order list

## 2011-05-05 NOTE — Anesthesia Procedure Notes (Addendum)
Anesthesia Regional Block:  Femoral nerve block  Pre-Anesthetic Checklist: ,, timeout performed, Correct Patient, Correct Site, Correct Laterality, Correct Procedure,, site marked, risks and benefits discussed, Surgical consent,  Pre-op evaluation,  At surgeon's request and post-op pain management  Laterality: Right  Prep: chloraprep       Needles:  Injection technique: Single-shot  Needle Type: Echogenic Stimulator Needle     Needle Length: 5cm 5 cm Needle Gauge: 22 and 22 G    Additional Needles:  Procedures: ultrasound guided and nerve stimulator Femoral nerve block  Nerve Stimulator or Paresthesia:  Response: quadraceps contraction, 0.45 mA,   Additional Responses:   Narrative:  Start time: 05/05/2011 9:50 AM End time: 05/05/2011 10:01 AM Injection made incrementally with aspirations every 5 mL.  Performed by: Personally  Anesthesiologist: Halford Decamp, MD  Additional Notes: Functioning IV was confirmed and monitors were applied.  A 50mm 22ga Arrow echogenic stimulator needle was used. Sterile prep and drape,hand hygiene and sterile gloves were used. Ultrasound guidance: relevent anatomy identified, needle position confirmed, local anesthetic spread visualized around nerve(s)., vascular puncture avoided.  Image printed for medical record. Negative aspiration and negative test dose prior to incremental administration of local anesthetic. The patient tolerated the procedure well.    Femoral nerve block Procedure Name: LMA Insertion Date/Time: 05/05/2011 10:43 AM Performed by: Margaree Mackintosh Pre-anesthesia Checklist: Patient identified, Timeout performed, Emergency Drugs available, Suction available and Patient being monitored Patient Re-evaluated:Patient Re-evaluated prior to inductionOxygen Delivery Method: Circle System Utilized Preoxygenation: Pre-oxygenation with 100% oxygen Intubation Type: IV induction LMA Size: 5.0 Number of attempts: 1 Placement  Confirmation: breath sounds checked- equal and bilateral and positive ETCO2 Tube secured with: Tape Dental Injury: Teeth and Oropharynx as per pre-operative assessment

## 2011-05-05 NOTE — Anesthesia Preprocedure Evaluation (Addendum)
Anesthesia Evaluation  Patient identified by MRN, date of birth, ID band Patient awake    Reviewed: Allergy & Precautions, H&P , NPO status , Patient's Chart, lab work & pertinent test results, reviewed documented beta blocker date and time   Airway Mallampati: I TM Distance: >3 FB Neck ROM: full    Dental  (+) Dental Advisory Given   Pulmonary neg pulmonary ROS,  clear to auscultation  Pulmonary exam normal       Cardiovascular hypertension, Pt. on medications regular Normal- Systolic murmurs    Neuro/Psych    GI/Hepatic negative GI ROS, Neg liver ROS,   Endo/Other  Negative Endocrine ROS  Renal/GU negative Renal ROS     Musculoskeletal   Abdominal Normal abdominal exam  (+)   Peds  Hematology negative hematology ROS (+)   Anesthesia Other Findings   Reproductive/Obstetrics                         Anesthesia Physical Anesthesia Plan  ASA: II  Anesthesia Plan: General LMA and Regional   Post-op Pain Management:    Induction: Intravenous  Airway Management Planned: LMA  Additional Equipment:   Intra-op Plan:   Post-operative Plan: Extubation in OR  Informed Consent: I have reviewed the patients History and Physical, chart, labs and discussed the procedure including the risks, benefits and alternatives for the proposed anesthesia with the patient or authorized representative who has indicated his/her understanding and acceptance.   Dental Advisory Given  Plan Discussed with: Anesthesiologist, CRNA and Surgeon  Anesthesia Plan Comments:        Anesthesia Quick Evaluation

## 2011-05-05 NOTE — Interval H&P Note (Signed)
History and Physical Interval Note:  05/05/2011 8:29 AM  Christa See  has presented today for surgery, with the diagnosis of DJD RIGHT KNEE  The various methods of treatment have been discussed with the patient and family. After consideration of risks, benefits and other options for treatment, the patient has consented to  Procedure(s) (LRB): TOTAL KNEE ARTHROPLASTY (Right) as a surgical intervention .  The patients' history has been reviewed, patient examined, no change in status, stable for surgery.  I have reviewed the patients' chart and labs.  Questions were answered to the patient's satisfaction.     Danaly Bari F

## 2011-05-06 LAB — CBC
Hemoglobin: 11 g/dL — ABNORMAL LOW (ref 13.0–17.0)
MCH: 27.4 pg (ref 26.0–34.0)
MCHC: 32.7 g/dL (ref 30.0–36.0)
RDW: 16.3 % — ABNORMAL HIGH (ref 11.5–15.5)

## 2011-05-06 LAB — BASIC METABOLIC PANEL
Calcium: 8.1 mg/dL — ABNORMAL LOW (ref 8.4–10.5)
GFR calc Af Amer: 66 mL/min — ABNORMAL LOW (ref >90)
GFR calc non Af Amer: 57 mL/min — ABNORMAL LOW (ref >90)
Glucose, Bld: 138 mg/dL — ABNORMAL HIGH (ref 70–99)
Potassium: 4.6 mEq/L (ref 3.5–5.1)
Sodium: 135 mEq/L (ref 135–145)

## 2011-05-06 LAB — PROTIME-INR
INR: 1.14 (ref 0.00–1.49)
Prothrombin Time: 14.8 seconds (ref 11.6–15.2)

## 2011-05-06 MED ORDER — HYDROCODONE-ACETAMINOPHEN 10-325 MG PO TABS
1.0000 | ORAL_TABLET | ORAL | Status: DC | PRN
  Administered 2011-05-06 – 2011-05-07 (×6): 2 via ORAL
  Filled 2011-05-06 (×6): qty 2

## 2011-05-06 MED ORDER — MORPHINE SULFATE (PF) 1 MG/ML IV SOLN
INTRAVENOUS | Status: AC
  Administered 2011-05-06: 10:00:00
  Filled 2011-05-06: qty 25

## 2011-05-06 MED ORDER — MORPHINE SULFATE (PF) 1 MG/ML IV SOLN
INTRAVENOUS | Status: AC
  Administered 2011-05-06: 14.87 mg
  Filled 2011-05-06: qty 25

## 2011-05-06 MED ORDER — MORPHINE SULFATE (PF) 1 MG/ML IV SOLN
INTRAVENOUS | Status: AC
  Filled 2011-05-06: qty 25

## 2011-05-06 MED ORDER — WARFARIN SODIUM 10 MG PO TABS
10.0000 mg | ORAL_TABLET | Freq: Once | ORAL | Status: AC
  Administered 2011-05-06: 10 mg via ORAL
  Filled 2011-05-06: qty 1

## 2011-05-06 NOTE — Progress Notes (Signed)
Physical Therapy Treatment Patient Details Name: Eric Macdonald MRN: [id number] DOB: 1955-12-23 Today's Date: 05/06/2011  PT Assessment/Plan  PT - Assessment/Plan Comments on Treatment Session: Pt admitted s/p right TKA and is able to progress with ambulation independence as well as distance.  Partial co-treatment with OT.  Will trial stairs tomorrow. PT Plan: Discharge plan remains appropriate;Frequency remains appropriate PT Frequency: 7X/week Follow Up Recommendations: Home health PT Equipment Recommended: None recommended by PT PT Goals  Acute Rehab PT Goals PT Goal Formulation: With patient/family Time For Goal Achievement: 7 days PT Goal: Sit to Supine/Side - Progress: Progressing toward goal PT Goal: Sit to Stand - Progress: Progressing toward goal PT Goal: Stand to Sit - Progress: Progressing toward goal PT Goal: Ambulate - Progress: Progressing toward goal PT Goal: Perform Home Exercise Program - Progress: Progressing toward goal  PT Treatment Precautions/Restrictions  Precautions Precautions: Knee Precaution Booklet Issued: No Required Braces or Orthoses: Yes Knee Immobilizer: On at all times (Right LE.) Restrictions Weight Bearing Restrictions: Yes RLE Weight Bearing: Weight bearing as tolerated Mobility (including Balance) Bed Mobility Bed Mobility: Yes Supine to Sit: Not tested (comment) Sit to Supine: 4: Min assist;HOB flat Sit to Supine - Details (indicate cue type and reason): Assist for right LE with cues for sequence. Transfers Transfers: Yes Sit to Stand: 4: Min assist;With upper extremity assist;From chair/3-in-1 (3 trials.) Sit to Stand Details (indicate cue type and reason): Assist for balance especially from low surface with cues for safest hand placement. Stand to Sit: 4: Min assist;With upper extremity assist;To chair/3-in-1;To bed (Min (guard); 3 trials.) Stand to Sit Details: Guarding for balance with cues for hand/right LE  placement. Ambulation/Gait Ambulation/Gait: Yes Ambulation/Gait Assistance: 4: Min assist (Min (guard)) Ambulation/Gait Assistance Details (indicate cue type and reason): Guarding for balance with cues for tall posture and sequence inside RW. Ambulation Distance (Feet): 150 Feet Assistive device: Rolling walker Gait Pattern: Step-to pattern;Decreased step length - right;Decreased stance time - right;Right flexed knee in stance Stairs: No Wheelchair Mobility Wheelchair Mobility: No  Posture/Postural Control Posture/Postural Control: No significant limitations Balance Balance Assessed: No Exercise  Total Joint Exercises Ankle Circles/Pumps: AROM;Right;10 reps;Supine Quad Sets: AROM;Right;10 reps;Supine Heel Slides: AAROM;Right;10 reps;Supine Hip ABduction/ADduction: AAROM;Right;10 reps;Supine Straight Leg Raises: AAROM;Right;10 reps;Supine Knee Flexion: AAROM;Right;10 reps;Seated End of Session PT - End of Session Equipment Utilized During Treatment: Gait belt;Right knee immobilizer Activity Tolerance: Patient tolerated treatment well Patient left: in bed;in CPM;with call bell in reach (CPM 0-60 degrees.  RN aware.) Nurse Communication: Mobility status for transfers;Mobility status for ambulation General Behavior During Session: [facility] for tasks performed Cognition: [facility] for tasks performed  Cephus Shelling 05/06/2011, 4:01 PM  05/06/2011 Cephus Shelling, PT, DPT [phone]

## 2011-05-06 NOTE — Progress Notes (Signed)
Utilization review completed. Lataisha Colan, RN, BSN. 05/06/11 

## 2011-05-06 NOTE — Op Note (Signed)
NAMECHRISTIAAN, Eric Macdonald NO.:  [id number]  MEDICAL RECORD NO.:  [id number]  LOCATION:  5038                         FACILITY:  [facility]  PHYSICIAN:  Loreta Ave, M.D. DATE OF BIRTH:  1955-07-28  DATE OF PROCEDURE: DATE OF DISCHARGE:                              OPERATIVE REPORT   PREOPERATIVE DIAGNOSIS:  Right knee end-stage degenerative arthritis, varus alignment.  POSTOPERATIVE DIAGNOSE:  Right knee end-stage degenerative arthritis, varus alignment.  PROCEDURE:  Right total knee replacement.  Modified minimally invasive Stryker triathlon prosthesis.  Soft tissue balancing.  Cemented pegged posterior stabilized #6 femoral component.  Cemented #7 tibial component, 9-mm polyethylene insert.  Cemented resurfacing 38-mm patellar component.  SURGEON:  Loreta Ave, MD  ASSISTANT:  Genene Churn. Barry Dienes, [place], present throughout the entire case and necessary for timely completion of the procedure.  ANESTHESIA:  General.  BLOOD LOSS:  Minimal.  SPECIMENS:  None.  CULTURES:  None.  COMPLICATIONS:  None.  DRESSINGS:  Soft compressive knee immobilizer.  DRAINS:  Hemovac x1.  TOURNIQUET TIME:  1 hour 5 minutes.  PROCEDURE:  The patient was brought to the operating room and placed on the operating table in supine position.  After adequate anesthesia had been obtained, knee examined.  Fairly good flexion and extension.  Varus alignment correctable.  Tourniquet applied, prepped and draped in usual sterile fashion.  Exsanguinated with elevation of Esmarch, tourniquet inflated to 350 mmHg.  Straight incision above the patella down to tibial tubercle.  Medial arthrotomy, vastus splitting, preserving quad tendon.  Knee exposed.  Medial capsule release.  Grade 4 changes throughout.  Remnants of menisci, cruciate ligament, spurs removed. Intramedullary guide on the femur.  8-mm resection at 5 degrees of valgus.  Using epicondylar axis, the femur was sized, cut, and  fitted for posterior stabilized #6 component.  Attention was turned to the tibia.  Extramedullary guide.  3-degree posterior slope cut.  Debris cleared throughout the knee.  Assessed with good balance in flexion and extension.  The tibia sized with #7 component.  Patella exposed; posterior 10 mm removed; drilled, sized, and fitted for a 38- mm component.  Trials put in place throughout.  #6 above, #7 below 9-mm insert, 38 patella.  With this construct, excellent biomechanical axis, nicely balanced flexion and extension, full motion, good stability, good patellofemoral tracking.  Tibia was marked for rotation and reamed.  All trials removed.  Copious irrigation with a pulse irrigating device. Cement prepared and all components firmly seated.  Polyethylene attached to tibia, knee reduced.  Patella held with a clamp.  Once cement was hardened, the knee was reexamined, again pleased with the motion, stability, and alignment.  Hemovac placed through a separate stab wound. Arthrotomy closed with #1 Vicryl, skin and subcutaneous tissue with Vicryl and staples.  Sterile compressive dressing applied.  Tourniquet deflated and removed.  Knee immobilizer applied.  Anesthesia reversed. Brought to the recovery room.  Tolerated the surgery well.  No complications.     Loreta Ave, M.D.     DFM/MEDQ  D:  05/05/2011  T:  05/06/2011  Job:  [id number]

## 2011-05-06 NOTE — Progress Notes (Signed)
ANTICOAGULATION CONSULT NOTE - Follow Up Consult  Pharmacy Consult for Coumadin Indication: VTE prophylaxis  No Known Allergies  Patient Measurements:   Vital Signs: Temp: 99.1 F (37.3 C) (02/21 0620) Temp src: Oral (02/21 0620) BP: 103/65 mmHg (02/21 1126) Pulse Rate: 92  (02/21 0148)  Labs:  Basename 05/06/11 0545  HGB 11.0*  HCT 33.6*  PLT 242  APTT --  LABPROT 14.8  INR 1.14  HEPARINUNFRC --  CREATININE 1.36*  CKTOTAL --  CKMB --  TROPONINI --   CrCl is unknown because there is no height on file for the current visit.   Medications:  Scheduled:    .  ceFAZolin (ANCEF) IV  1 g Intravenous Q8H  . cloNIDine  0.1 mg Oral BID  . coumadin book   Does not apply Once  . enoxaparin  30 mg Subcutaneous Q12H  . hydrochlorothiazide  25 mg Oral Daily  . morphine      . morphine      . morphine      . morphine      . traZODone  150 mg Oral QHS  . warfarin  10 mg Oral ONCE-1800  . warfarin   Does not apply Once  . DISCONTD: morphine   Intravenous Q4H  . DISCONTD: warfarin   Does not apply Once  . DISCONTD: warfarin   Does not apply Once    Assessment: 56yo male s/p R-TKA, on Coumadin for VTE px.  No bleeding problems noted.  INR 1.14 this AM.  Goal of Therapy:  INR 2-3   Plan:  1.  Coumadin 10mg  today 2.  F/U in AM  Alianys Chacko P 05/06/2011,11:31 AM

## 2011-05-06 NOTE — Progress Notes (Signed)
Subjective: Doing well, pain controlled  Objective: Vital signs in last 24 hours: Temp:  [97.7 F (36.5 C)-99.5 F (37.5 C)] 99.1 F (37.3 C) (02/21 0620) Pulse Rate:  [92-108] 92  (02/21 0148) Resp:  [12-20] 18  (02/21 0800) BP: (94-167)/(57-105) 98/57 mmHg (02/21 0620) SpO2:  [97 %-100 %] 98 % (02/21 0800)  Intake/Output from previous day: 02/20 0701 - 02/21 0700 In: 2440 [I.V.:1890; IV Piggyback:550] Out: 1075 [Urine:1075] Intake/Output this shift:     Basename 05/06/11 0545  HGB 11.0*    Basename 05/06/11 0545  WBC 6.3  RBC 4.02*  HCT 33.6*  PLT 242    Basename 05/06/11 0545  NA 135  K 4.6  CL 100  CO2 25  BUN 12  CREATININE 1.36*  GLUCOSE 138*  CALCIUM 8.1*    Basename 05/06/11 0545  LABPT --  INR 1.14    Neurovascular intact.  Some bleeding thru dressing.  Assessment/Plan: PT consult.  D/c pca.  Anticipate d/c home fri or sat.   Lejla Moeser M 05/06/2011, 10:19 AM

## 2011-05-06 NOTE — Progress Notes (Signed)
Occupational Therapy Evaluation Patient Details Name: Eric Macdonald MRN: [id number] DOB: [birth year] Today's Date: 05/06/2011  Problem List: There is no problem list on file for this patient.   Past Medical History:  Past Medical History  Diagnosis Date  . Knee pain   . Hypertension   . Arthritis    Past Surgical History:  Past Surgical History  Procedure Date  . Knee surgery 08    rt arthroscopy    OT Assessment/Plan/Recommendation OT Assessment Clinical Impression Statement: 56 yo s/p L TKA. KI at all times except when in CPM. All educatin completed. Written information given. No further OT needs. Pt plans to D/C home with family who will assit as needed.  OT Recommendation/Assessment: Patient does not need any further OT services OT Recommendation Follow Up Recommendations: No OT follow up Equipment Recommended: None recommended by PT;Tub/shower bench OT Goals Acute Rehab OT Goals OT Goal Formulation:  (eval only)  OT Evaluation Precautions/Restrictions  Precautions Precautions: Knee Precaution Booklet Issued: No Required Braces or Orthoses: Yes Knee Immobilizer: On at all times (Right LE.) Restrictions Weight Bearing Restrictions: Yes RLE Weight Bearing: Weight bearing as tolerated Prior Functioning Home Living Lives With: Spouse Type of Home: Apartment Home Layout: One level Home Access: Stairs to enter Entrance Stairs-Rails: Right;Left;Can reach both Entrance Stairs-Number of Steps: 36 (3 flights.) Bathroom Shower/Tub: Forensic scientist: Handicapped height Bathroom Accessibility: Yes How Accessible: Accessible via walker Home Adaptive Equipment: Walker - rolling;Shower chair with back Prior Function Level of Independence: Independent with basic ADLs;Independent with homemaking with ambulation;Independent with gait;Independent with transfers Able to Take Stairs?: Reciprically Driving: Yes Vocation:  Retired ADL ADL Eating/Feeding: Simulated;Independent Grooming: Simulated;Supervision/safety Upper Body Bathing: Simulated;Set up Where Assessed - Upper Body Bathing: Standing at sink Lower Body Bathing: Simulated;Minimal assistance Where Assessed - Lower Body Bathing: Sit to stand from bed Upper Body Dressing: Simulated;Set up Where Assessed - Upper Body Dressing: Sitting, bed Lower Body Dressing: Performed;Moderate assistance Where Assessed - Lower Body Dressing: Sit to stand from chair Toilet Transfer: Simulated;Supervision/safety Toilet Transfer Method: Proofreader: Bedside commode Toileting - Clothing Manipulation: Simulated;Supervision/safety Where Assessed - Glass blower/designer Manipulation: Standing Toileting - Hygiene: Simulated;Independent Where Assessed - Toileting Hygiene: Standing Tub/Shower Transfer: Performed;Supervision/safety Tub/Shower Transfer Method: Science writer: Counsellor Used: Leg lifter;Long-handled shoe horn;Long-handled sponge;Reacher;Rolling walker;Sock aid Ambulation Related to ADLs: S. Educated pt on use of AE to increase indep with ADL. Availability o AE discussed. Pt may want tub bench. will let PT know. ADL Comments: Pt's daughter to assist as needed. Pt aware of available AE. Pt Mod I with AE. Vision/Perception    Cognition Cognition Arousal/Alertness: Awake/alert Overall Cognitive Status: Appears within functional limits for tasks assessed Orientation Level: Oriented X4 Sensation/Coordination Sensation Light Touch: Appears Intact Stereognosis: Not tested Hot/Cold: Not tested Proprioception: Not tested Coordination Gross Motor Movements are Fluid and Coordinated: Yes Fine Motor Movements are Fluid and Coordinated: Yes Extremity Assessment RUE Assessment RUE Assessment: Within Functional Limits LUE Assessment LUE Assessment: Within Functional Limits Mobility  Bed  Mobility Bed Mobility: Yes Supine to Sit: Not tested (comment) Sit to Supine: 4: Min assist;HOB flat Sit to Supine - Details (indicate cue type and reason): Assist for right LE with cues for sequence. Transfers Transfers: Yes Sit to Stand: With upper extremity assist;From chair/3-in-1;5: Supervision (3 trials.) Sit to Stand Details (indicate cue type and reason): cues for hand placement Stand to Sit: 4: Min assist;With upper extremity assist;To chair/3-in-1;To bed (Min (guard); 3 trials.)  Stand to Sit Details: Guarding for balance with cues for hand/right LE placement. End of Session General Behavior During Session: [facility] for tasks performed Cognition: [facility] for tasks performed   [facility] 05/06/2011, 5:24 PM  [facility], OTR/L  [phone] 05/06/2011

## 2011-05-06 NOTE — Progress Notes (Signed)
Physical Therapy Evaluation Patient Details Name: Eric Macdonald MRN: [id number] DOB: 1956/01/14 Today's Date: 05/06/2011  Problem List: There is no problem list on file for this patient.   Past Medical History:  Past Medical History  Diagnosis Date  . Knee pain   . Hypertension   . Arthritis    Past Surgical History:  Past Surgical History  Procedure Date  . Knee surgery 08    rt arthroscopy    PT Assessment/Plan/Recommendation PT Assessment Clinical Impression Statement: Pt is a 56 y/o male admitted s/p right TKA along with the below PT problem list.  Pt would benefit from acute PT to maximize independence and facilitate d/c home with HHPT. PT Recommendation/Assessment: Patient will need skilled PT in the acute care venue PT Problem List: Decreased strength;Decreased range of motion;Decreased activity tolerance;Decreased balance;Decreased mobility;Decreased knowledge of use of DME;Decreased knowledge of precautions;Pain Barriers to Discharge: None PT Therapy Diagnosis : Difficulty walking;Acute pain PT Plan PT Frequency: 7X/week PT Treatment/Interventions: DME instruction;Gait training;Stair training;Functional mobility training;Therapeutic activities;Therapeutic exercise;Balance training;Patient/family education PT Recommendation Follow Up Recommendations: Home health PT Equipment Recommended: None recommended by PT PT Goals  Acute Rehab PT Goals PT Goal Formulation: With patient/family Time For Goal Achievement: 7 days Pt will go Supine/Side to Sit: with modified independence PT Goal: Supine/Side to Sit - Progress: Goal set today Pt will go Sit to Supine/Side: with modified independence PT Goal: Sit to Supine/Side - Progress: Goal set today Pt will go Sit to Stand: with modified independence PT Goal: Sit to Stand - Progress: Goal set today Pt will go Stand to Sit: with modified independence PT Goal: Stand to Sit - Progress: Goal set today Pt will Ambulate: >150  feet;with modified independence;with least restrictive assistive device PT Goal: Ambulate - Progress: Goal set today Pt will Go Up / Down Stairs: Flight;with min assist;with least restrictive assistive device PT Goal: Up/Down Stairs - Progress: Goal set today Pt will Perform Home Exercise Program: Independently PT Goal: Perform Home Exercise Program - Progress: Goal set today  PT Evaluation Precautions/Restrictions  Precautions Precautions: Knee Precaution Booklet Issued: No Required Braces or Orthoses: Yes Knee Immobilizer: On at all times (Right LE.) Restrictions Weight Bearing Restrictions: Yes RLE Weight Bearing: Weight bearing as tolerated Prior Functioning  Home Living Lives With: Spouse Type of Home: Apartment Home Layout: One level Home Access: Stairs to enter Entrance Stairs-Rails: Right;Left;Can reach both Entrance Stairs-Number of Steps: 36 (3 flights.) Home Adaptive Equipment: Walker - rolling Prior Function Level of Independence: Independent with basic ADLs;Independent with homemaking with ambulation;Independent with gait;Independent with transfers Able to Take Stairs?: Reciprically Driving: Yes Vocation: Retired Producer, television/film/video: Awake/alert Overall Cognitive Status: Appears within functional limits for tasks assessed Orientation Level: Oriented X4 Sensation/Coordination Sensation Light Touch: Appears Intact Stereognosis: Not tested Hot/Cold: Not tested Proprioception: Not tested Coordination Gross Motor Movements are Fluid and Coordinated: Yes Fine Motor Movements are Fluid and Coordinated: Yes Extremity Assessment RUE Assessment RUE Assessment: Within Functional Limits LUE Assessment LUE Assessment: Within Functional Limits RLE Assessment RLE Assessment: Exceptions to [place] RLE AROM (degrees) Right Knee Extension 0-130: 5  Right Knee Flexion 0-140: 40  RLE Strength RLE Overall Strength: Deficits;Due to pain RLE Overall  Strength Comments: 3/5 LLE Assessment LLE Assessment: Within Functional Limits Pain 6/10 in right knee.  Pt repositioned and encouraged use of PCA. Mobility (including Balance) Bed Mobility Bed Mobility: Yes Supine to Sit: 4: Min assist;HOB elevated (Comment degrees) (HOB 30 degrees.) Supine to Sit Details (indicate cue type and  reason): Assist for right LE with cues for sequence. Transfers Transfers: Yes Sit to Stand: 4: Min assist;With upper extremity assist;From bed Sit to Stand Details (indicate cue type and reason): Assist for balance with cues for hand placement. Stand to Sit: 4: Min assist;With upper extremity assist;To chair/3-in-1 Stand to Sit Details: Assist to slow descent to chair with cues for hand/right LE placement. Ambulation/Gait Ambulation/Gait: Yes Ambulation/Gait Assistance: 4: Min assist Ambulation/Gait Assistance Details (indicate cue type and reason): Assist for balance with cues for sequence and safety inside RW. Ambulation Distance (Feet): 122 Feet Assistive device: Rolling walker Gait Pattern: Step-to pattern;Decreased step length - right;Decreased stance time - right;Right flexed knee in stance Stairs: No Wheelchair Mobility Wheelchair Mobility: No  Posture/Postural Control Posture/Postural Control: No significant limitations Balance Balance Assessed: No Exercise  Total Joint Exercises Ankle Circles/Pumps: AROM;Right;10 reps;Supine Quad Sets: AROM;Right;10 reps;Supine Heel Slides: AAROM;Right;10 reps;Supine End of Session PT - End of Session Equipment Utilized During Treatment: Gait belt;Right knee immobilizer Activity Tolerance: Patient tolerated treatment well Patient left: in chair;with call bell in reach;with family/visitor present Nurse Communication: Mobility status for transfers;Mobility status for ambulation General Behavior During Session: [facility] for tasks performed Cognition: [facility] for tasks performed  Cephus Shelling 05/06/2011, 12:37  PM  05/06/2011 Cephus Shelling, PT, DPT [phone]

## 2011-05-07 LAB — CBC
HCT: 29.4 % — ABNORMAL LOW (ref 39.0–52.0)
MCHC: 32.7 g/dL (ref 30.0–36.0)
MCV: 82.4 fL (ref 78.0–100.0)
Platelets: 219 10*3/uL (ref 150–400)
RDW: 16.3 % — ABNORMAL HIGH (ref 11.5–15.5)

## 2011-05-07 LAB — BASIC METABOLIC PANEL
BUN: 7 mg/dL (ref 6–23)
Creatinine, Ser: 1 mg/dL (ref 0.50–1.35)
GFR calc Af Amer: >90 mL/min (ref >90)
GFR calc non Af Amer: 82 mL/min — ABNORMAL LOW (ref >90)
Potassium: 3.5 mEq/L (ref 3.5–5.1)

## 2011-05-07 LAB — PROTIME-INR: INR: 1.5 — ABNORMAL HIGH (ref 0.00–1.49)

## 2011-05-07 MED ORDER — BISACODYL 10 MG RE SUPP
10.0000 mg | Freq: Once | RECTAL | Status: AC
  Administered 2011-05-07: 10 mg via RECTAL

## 2011-05-07 MED ORDER — WARFARIN SODIUM 10 MG PO TABS
10.0000 mg | ORAL_TABLET | Freq: Once | ORAL | Status: DC
  Filled 2011-05-07: qty 1

## 2011-05-07 NOTE — Progress Notes (Signed)
Subjective: Doing well.  Pain controlled.  Did great with therapy and wants to go home today.   Objective: Vital signs in last 24 hours: Temp:  [98 F (36.7 C)-100.5 F (38.1 C)] 100.5 F (38.1 C) (02/22 0603) Pulse Rate:  [104-108] 108  (02/22 0603) Resp:  [18] 18  (02/22 0603) BP: (104-127)/(66-84) 127/84 mmHg (02/22 1106) SpO2:  [92 %-95 %] 92 % (02/22 0603)  Intake/Output from previous day: 02/21 0701 - 02/22 0700 In: 1200 [P.O.:1200] Out: 175 [Drains:175] Intake/Output this shift: Total I/O In: 240 [P.O.:240] Out: -    Basename 05/07/11 0515 05/06/11 0545  HGB 9.6* 11.0*    Basename 05/07/11 0515 05/06/11 0545  WBC 5.8 6.3  RBC 3.57* 4.02*  HCT 29.4* 33.6*  PLT 219 242    Basename 05/07/11 0515 05/06/11 0545  NA 139 135  K 3.5 4.6  CL 102 100  CO2 31 25  BUN 7 12  CREATININE 1.00 1.36*  GLUCOSE 131* 138*  CALCIUM 8.1* 8.1*    Basename 05/07/11 0515 05/06/11 0545  LABPT -- --  INR 1.50* 1.14    Compartment soft.  Wound looks good.  Staples intact.  No drainage or signs of infection.  hemovac removed.    Assessment/Plan: D/c home today.  F/u 2 weeks postop.     Eric Macdonald M 05/07/2011, 12:13 PM

## 2011-05-07 NOTE — Progress Notes (Signed)
Offered Lovenox ed. And demonstraton - pt states not needed and wife is going to give Lovenox.

## 2011-05-07 NOTE — Progress Notes (Signed)
ANTICOAGULATION CONSULT NOTE - Follow Up Consult  Pharmacy Consult for Coumadin Indication: VTE prophylaxis  No Known Allergies  Vital Signs: Temp: 100.5 F (38.1 C) (02/22 0603) BP: 104/68 mmHg (02/22 0603) Pulse Rate: 108  (02/22 0603)  Labs:  Basename 05/07/11 0515 05/06/11 0545  HGB 9.6* 11.0*  HCT 29.4* 33.6*  PLT 219 242  APTT -- --  LABPROT 18.4* 14.8  INR 1.50* 1.14  HEPARINUNFRC -- --  CREATININE 1.00 1.36*  CKTOTAL -- --  CKMB -- --  TROPONINI -- --   CrCl is unknown because there is no height on file for the current visit.  Assessment: 55yom s/p right TKA continues on coumadin for VTE prophylaxis. INR remains below goal but beginning to trend up with 10mg  doses. No bleeding noted.  Goal of Therapy:  INR 2-3   Plan:  1) Coumadin 10mg  x 1 2) Follow up INR in AM 3) Continue lovenox 30mg  q12 until INR therapeutic  Fredrik Rigger 05/07/2011,10:37 AM

## 2011-05-07 NOTE — Progress Notes (Signed)
Physical Therapy Treatment Patient Details Name: Eric Macdonald MRN: [id number] DOB: 1955-11-04 Today's Date: 05/07/2011  PT Assessment/Plan  PT - Assessment/Plan Comments on Treatment Session: Pt admitted s/p right TKA and is very motivated.  Pt able to increase ambulation distance/independence as well as negotiate stairs this am. PT Plan: Discharge plan remains appropriate;Frequency remains appropriate PT Frequency: 7X/week Follow Up Recommendations: Home health PT Equipment Recommended: None recommended by PT;Tub/shower bench PT Goals  Acute Rehab PT Goals PT Goal Formulation: With patient/family Time For Goal Achievement: 7 days PT Goal: Supine/Side to Sit - Progress: Progressing toward goal PT Goal: Sit to Supine/Side - Progress: Progressing toward goal PT Goal: Sit to Stand - Progress: Progressing toward goal PT Goal: Stand to Sit - Progress: Progressing toward goal PT Goal: Ambulate - Progress: Progressing toward goal PT Goal: Up/Down Stairs - Progress: Progressing toward goal  PT Treatment Precautions/Restrictions  Precautions Precautions: Knee Precaution Booklet Issued: No Required Braces or Orthoses: Yes Knee Immobilizer: On at all times (Right LE.) Restrictions Weight Bearing Restrictions: Yes RLE Weight Bearing: Weight bearing as tolerated Mobility (including Balance) Bed Mobility Bed Mobility: Yes Supine to Sit: 4: Min assist;HOB flat Supine to Sit Details (indicate cue type and reason): Assist for right LE with cues for sequence. Sit to Supine: 4: Min assist;HOB flat Sit to Supine - Details (indicate cue type and reason): Assist for right LE with cues for sequence. Transfers Transfers: Yes Sit to Stand: 5: Supervision;With upper extremity assist;From bed;From chair/3-in-1 (2 trials.) Sit to Stand Details (indicate cue type and reason): Verbal cues for safest hand placement. Stand to Sit: 5: Supervision;With upper extremity assist;To chair/3-in-1;To bed (2  trials.) Stand to Sit Details: Verbal cues for safest hand/right LE placement. Ambulation/Gait Ambulation/Gait: Yes Ambulation/Gait Assistance: 5: Supervision Ambulation/Gait Assistance Details (indicate cue type and reason): Verbal cues for tall posture and safe sequence. Ambulation Distance (Feet): 180 Feet Assistive device: Rolling walker Gait Pattern: Step-to pattern;Decreased step length - right;Decreased stance time - right Stairs: Yes Stairs Assistance: 4: Min assist Stairs Assistance Details (indicate cue type and reason): Assist for balance as well as cues for sequence using "up with good, down with bad." Stair Management Technique: Two rails;Step to pattern;Forwards Number of Stairs: 2  Height of Stairs: 8  (inches.) Wheelchair Mobility Wheelchair Mobility: No  Posture/Postural Control Posture/Postural Control: No significant limitations Balance Balance Assessed: No End of Session PT - End of Session Equipment Utilized During Treatment: Gait belt;Right knee immobilizer Activity Tolerance: Patient tolerated treatment well Patient left: in bed;with call bell in reach;with family/visitor present Nurse Communication: Mobility status for transfers;Mobility status for ambulation General Behavior During Session: [facility] for tasks performed Cognition: [facility] for tasks performed  Cephus Shelling 05/07/2011, 9:10 AM  05/07/2011 Cephus Shelling, PT, DPT [phone]

## 2011-05-07 NOTE — Progress Notes (Signed)
PT Treatment Note:   05/07/11 0934  PT Visit Information  Last PT Received On 05/07/11  Precautions  Precautions Knee  Precaution Booklet Issued No  Required Braces or Orthoses Yes  Knee Immobilizer On at all times (Right LE.)  Restrictions  Weight Bearing Restrictions Yes  RLE Weight Bearing WBAT  Bed Mobility  Bed Mobility Yes  Supine to Sit 4: Min assist;HOB flat  Supine to Sit Details (indicate cue type and reason) Assist for right LE.  Sit to Supine Not Tested (comment)  Transfers  Transfers Yes  Sit to Stand 5: Supervision;With upper extremity assist;From bed  Sit to Stand Details (indicate cue type and reason) Verbal cues for hand placement.  Stand to Sit 6: Modified independent (Device/Increase time);With upper extremity assist;To chair/3-in-1  Ambulation/Gait  Ambulation/Gait Yes  Ambulation/Gait Assistance 5: Supervision  Ambulation/Gait Assistance Details (indicate cue type and reason) Verbal cues for hand placement.  Ambulation Distance (Feet) 20 Feet  Assistive device Rolling walker  Gait Pattern Step-to pattern;Decreased step length - right;Decreased stance time - right  Stairs No  Stairs Assistance Not tested (comment)  Wheelchair Mobility  Wheelchair Mobility No  Posture/Postural Control  Posture/Postural Control No significant limitations  Balance  Balance Assessed No  Exercises  Exercises Total Joint  Total Joint Exercises  Ankle Circles/Pumps AROM;Right;10 reps;Supine  Quad Sets AROM;Right;10 reps;Supine  Heel Slides AAROM;Right;10 reps;Supine  Hip ABduction/ADduction AAROM;Right;10 reps;Supine  Straight Leg Raises AAROM;Right;10 reps;Supine  PT - End of Session  Equipment Utilized During Treatment Gait belt;Right knee immobilizer  Activity Tolerance Patient tolerated treatment well  Patient left with call bell in reach;with family/visitor present (In bathroom with RN student present.)  Nurse Communication Mobility status for transfers;Mobility  status for ambulation  General  Behavior During Session [facility] for tasks performed  Cognition [facility] for tasks performed  PT - Assessment/Plan  Comments on Treatment Session Pt admitted s/p right TKA and is ready for d/c once medically cleared by MD.  PT Plan Discharge plan remains appropriate;Frequency remains appropriate  PT Frequency 7X/week  Follow Up Recommendations Home health PT  Equipment Recommended None recommended by PT (Pt and wife to discuss tub bench with HHPT.)  Acute Rehab PT Goals  PT Goal Formulation With patient/family  Time For Goal Achievement 7 days  PT Goal: Supine/Side to Sit - Progress Progressing toward goal  PT Goal: Sit to Stand - Progress Progressing toward goal  PT Goal: Stand to Sit - Progress Progressing toward goal  PT Goal: Ambulate - Progress Progressing toward goal  PT Goal: Perform Home Exercise Program - Progress Progressing toward goal    Educated wife on sequence with stairs using bilateral rails and going forward.  Demonstrated sequence of "up with good, down with bad."  Also educated wife on transfer using tub transfer bench as practiced with OT yesterday during treatment session.  05/07/2011 Cephus Shelling, PT, DPT [phone]

## 2011-05-10 ENCOUNTER — Encounter (HOSPITAL_COMMUNITY): Payer: Self-pay | Admitting: Orthopedic Surgery

## 2011-05-12 NOTE — Discharge Summary (Signed)
  ABBREVIATED DISCHARGE SUMMARY      DATE OF HOSPITALIZATION:  05 May 2011  REASON FOR HOSPITALIZATION:  55 YO BM WITH HX END STAGE DJD RIGHT KNEE AND CHRONIC PAIN    SIGNIFICANT FINDINGS:  DJD  OPERATION:  RIGHT TKR  FINAL DIAGNOSIS:  SAME  SECONDARY DIAGNOSIS: none  CONSULTANTS:  none  DISCHARGE CONDITION:  STABLE  DISCHARGED TO:  HOME

## 2012-09-24 ENCOUNTER — Emergency Department (INDEPENDENT_AMBULATORY_CARE_PROVIDER_SITE_OTHER)
Admission: EM | Admit: 2012-09-24 | Discharge: 2012-09-24 | Disposition: A | Discharge status: Home / Self Care | Payer: Medicare Other | Source: Home / Self Care

## 2012-09-24 ENCOUNTER — Encounter (HOSPITAL_COMMUNITY): Payer: Self-pay | Admitting: *Deleted

## 2012-09-24 DIAGNOSIS — T43205A Adverse effect of unspecified antidepressants, initial encounter: Secondary | ICD-10-CM

## 2012-09-24 DIAGNOSIS — T50995A Adverse effect of other drugs, medicaments and biological substances, initial encounter: Secondary | ICD-10-CM

## 2012-09-24 DIAGNOSIS — G4700 Insomnia, unspecified: Secondary | ICD-10-CM

## 2012-09-24 MED ORDER — TRAZODONE HCL ER 150 MG PO TB24: 1.0000 | ORAL_TABLET | Freq: Every evening | ORAL | Status: DC | PRN

## 2012-09-24 NOTE — ED Provider Notes (Signed)
History    CSN: [id number] Arrival date & time 09/24/12  1120  First MD Initiated Contact with Patient 09/24/12 1202     Chief Complaint  Patient presents with  . Insomnia   (Consider location/radiation/quality/duration/timing/severity/associated sxs/prior Treatment) HPI Comments: This 57 year old pleasant male is accompanied by his wife with a complaint of inability to sleep for approximately one week. He is out of his medication trazodone 100 mg. He has been taking that medication on a nightly basis for approximately 3 years. His physician has been refilling his medications although one week ago he did not refill her trazodone. They are no longer able to see him as they did not have the funds to pay the bill. He is feeling tired, sleepy, drowsy with occasional slow thinking and minor confusion. He stays awake for most of the night.  Past Medical History  Diagnosis Date  . Knee pain   . Hypertension   . Arthritis    Past Surgical History  Procedure Laterality Date  . Knee surgery  08    rt arthroscopy  . Total knee arthroplasty  05/05/2011    Procedure: TOTAL KNEE ARTHROPLASTY;  Surgeon: Loreta Ave, MD;  Location: [facility] OR;  Service: Orthopedics;  Laterality: Right;  DR MURPHY WANTS 90 MINUTES FOR THIS CASE   No family history on file. History  Substance Use Topics  . Smoking status: Former Smoker -- 0.50 packs/day    Quit date: 04/27/1986  . Smokeless tobacco: Not on file  . Alcohol Use: No    Review of Systems  Constitutional: Positive for activity change and fatigue. Negative for fever and chills.  HENT: Negative.   Respiratory: Negative.   Cardiovascular: Negative.   Gastrointestinal: Negative.   Genitourinary: Negative.   Neurological: Negative for dizziness, syncope and headaches.  Hematological: Does not bruise/bleed easily.  Psychiatric/Behavioral: Positive for confusion and sleep disturbance. Negative for suicidal ideas.    Allergies  Review of patient's  allergies indicates no known allergies.  Home Medications   Current Outpatient Rx  Name  Route  Sig  Dispense  Refill  . cloNIDine (CATAPRES) 0.1 MG tablet   Oral   Take 0.1 mg by mouth 2 (two) times daily.          . hydrochlorothiazide (HYDRODIURIL) 25 MG tablet   Oral   Take 25 mg by mouth daily.          Marland Kitchen HYDROcodone-acetaminophen (NORCO) 10-325 MG per tablet   Oral   Take 1 tablet by mouth every 6 (six) hours as needed. For pain.         . naphazoline-pheniramine (NAPHCON-A) 0.025-0.3 % ophthalmic solution   Both Eyes   Place 1 drop into both eyes daily as needed. For red eyes.         . potassium chloride (K-DUR) 10 MEQ tablet   Oral   Take 10 mEq by mouth daily.          . rosuvastatin (CRESTOR) 10 MG tablet   Oral   Take 10 mg by mouth daily.         . traZODone (DESYREL) 150 MG tablet   Oral   Take 150 mg by mouth at bedtime.         . TraZODone HCl 150 MG TB24   Oral   Take 1 tablet (150 mg total) by mouth at bedtime as needed (sleep).   20 tablet   0    BP 137/104  Pulse 94  Temp(Src)  98.5 F (36.9 C) (Oral)  Resp 19  SpO2 99% Physical Exam  Nursing note and vitals reviewed. Constitutional: He appears well-developed and well-nourished. No distress.  Eyes: Conjunctivae and EOM are normal.  Neck: Normal range of motion. Neck supple.  Cardiovascular: Normal rate, regular rhythm and normal heart sounds.   Pulmonary/Chest: Effort normal and breath sounds normal. No respiratory distress. He has no wheezes. He has no rales.  Musculoskeletal: He exhibits no edema and no tenderness.  Lymphadenopathy:    He has no cervical adenopathy.  Neurological: He is alert. He exhibits normal muscle tone.  Skin: Skin is warm and dry. No rash noted. No erythema.  Psychiatric: He has a normal mood and affect.    ED Course  Procedures (including critical care time) Labs Reviewed - No data to display No results found. 1. Insomnia   2. Antidepressant  discontinuation syndrome     MDM  Rx for trazodone 150 mg each bedtime when necessary sleep #20 Will give information on [facility] to call for appointment and establish as patient. The patient is otherwise asymptomatic and discharged in stable condition accompanied by his wife.  Hayden Rasmussen, NP 09/24/12 1248

## 2012-09-24 NOTE — ED Notes (Signed)
Patient complains of insomnia x 7 nights; States that if he can go to sleep he wakes up and can't go back to sleep; states grogginess and fatigue.

## 2012-09-25 NOTE — ED Provider Notes (Signed)
Medical screening examination/treatment/procedure(s) were performed by resident physician or non-physician practitioner and as supervising physician I was immediately available for consultation/collaboration.   Melvern Ramone DOUGLAS MD.   Colbi Schiltz D Joeline Freer, MD 09/25/12 1341 

## 2012-10-12 ENCOUNTER — Encounter (HOSPITAL_COMMUNITY): Payer: Self-pay | Admitting: Emergency Medicine

## 2012-10-12 ENCOUNTER — Emergency Department (INDEPENDENT_AMBULATORY_CARE_PROVIDER_SITE_OTHER)
Admission: EM | Admit: 2012-10-12 | Discharge: 2012-10-12 | Disposition: A | Discharge status: Home / Self Care | Payer: Medicare Other | Source: Home / Self Care | Attending: Emergency Medicine | Admitting: Emergency Medicine

## 2012-10-12 DIAGNOSIS — N529 Male erectile dysfunction, unspecified: Secondary | ICD-10-CM

## 2012-10-12 MED ORDER — SILDENAFIL CITRATE 100 MG PO TABS: 100.0000 mg | ORAL_TABLET | Freq: Every day | ORAL | Status: DC | PRN

## 2012-10-12 NOTE — ED Notes (Signed)
Pt c/o erectile dysfunction x 4 weeks. Went to another clinic and was given a prescription for Cialis which worked temporarily. Pt is alert and in no acute distress.

## 2012-10-12 NOTE — ED Provider Notes (Signed)
Chief Complaint:   Chief Complaint  Patient presents with  . Erectile Dysfunction    History of Present Illness:   Eric Macdonald is a 57 year old male who has had a 5 to six-week history of erectile dysfunction he has spontaneous erections in the morning. He's able to get a partial erection prior to intercourse, but not enough for penetration. He denies any other urinary symptoms such as dysuria, frequency, urgency, discharge, or penile lesions. He denies any history of diabetes. He has no leg pain, swelling, numbness, tingling, or weakness. He's tried Cialis which she obtained from [facility]. He states this helped for a while but then stopped working. He would like to try something else. He takes hydrochlorothiazide and a medication for cholesterol. He does not use nitroglycerin or nitrates.  Review of Systems:  Other than noted above, the patient denies any of the following symptoms: General:  No fevers, chills, sweats, aches, or fatigue. GI:  No abdominal pain, back pain, nausea, vomiting, diarrhea, or constipation. GU:  No dysuria, frequency, urgency, hematuria, urethral discharge, penile lesions, penile pain, testicular pain, swelling, or mass, inguinal lymphadenopathy or incontinence.  PMFSH:  Past medical history, family history, social history, meds, and allergies were reviewed.    Physical Exam:   Vital signs:  BP 133/99  Pulse 87  Temp(Src) 98.7 F (37.1 C) (Oral)  Resp 18  SpO2 98% Gen:  Alert, oriented, in no distress. Lungs:  Clear to auscultation, no wheezes, rales or rhonchi. Heart:  Regular rhythm, no gallop or murmer. Abdomen:  Flat and soft.  No tenderness to palpation, guarding, or rebound.  No hepato-splenomegaly or mass.  Bowel sounds were normally active.  No hernia. Genital exam:  He is uncircumcised. Otherwise unremarkable. No penile lesions, no urethral discharge, testes were normal, nontender, and without any masses. Back:  No CVA tenderness.   Skin:  Clear, warm and dry.  Assessment: The encounter diagnosis was Erectile dysfunction.   He was given a prescription for Viagra, but I told him that this did not work he would need to see a urologist.  Plan:   1.  The following meds were prescribed:   Discharge Medication List as of 10/12/2012  9:22 AM    START taking these medications   Details  sildenafil (VIAGRA) 100 MG tablet Take 1 tablet (100 mg total) by mouth daily as needed for erectile dysfunction., Starting 10/12/2012, Until Discontinued, Normal       2.  The patient was instructed in symptomatic care and handouts were given. 3.  The patient was told to return if becoming worse in any way, if no better in 3 or 4 days, and given some red flag symptoms such as any new urinary symptoms that would indicate earlier return. 4.  Follow up with Dr. Ezzie Dural as needed.     Reuben Likes, MD 10/12/12 [phone]

## 2012-10-15 ENCOUNTER — Emergency Department (INDEPENDENT_AMBULATORY_CARE_PROVIDER_SITE_OTHER)
Admission: EM | Admit: 2012-10-15 | Discharge: 2012-10-15 | Disposition: A | Discharge status: Home / Self Care | Payer: Self-pay | Source: Home / Self Care | Attending: Emergency Medicine | Admitting: Emergency Medicine

## 2012-10-15 ENCOUNTER — Encounter (HOSPITAL_COMMUNITY): Payer: Self-pay | Admitting: *Deleted

## 2012-10-15 DIAGNOSIS — S39012A Strain of muscle, fascia and tendon of lower back, initial encounter: Secondary | ICD-10-CM

## 2012-10-15 DIAGNOSIS — S335XXA Sprain of ligaments of lumbar spine, initial encounter: Secondary | ICD-10-CM

## 2012-10-15 HISTORY — DX: Low back pain, unspecified: M54.50

## 2012-10-15 HISTORY — DX: Low back pain: M54.5

## 2012-10-15 HISTORY — DX: Other chronic pain: G89.29

## 2012-10-15 MED ORDER — MELOXICAM 15 MG PO TABS: 15.0000 mg | ORAL_TABLET | Freq: Every day | ORAL | Status: DC

## 2012-10-15 MED ORDER — HYDROCODONE-ACETAMINOPHEN 5-325 MG PO TABS: ORAL_TABLET | ORAL | Status: DC

## 2012-10-15 MED ORDER — CYCLOBENZAPRINE HCL 5 MG PO TABS
5.0000 mg | ORAL_TABLET | Freq: Three times a day (TID) | ORAL | Status: DC | PRN
Start: 2012-10-15 — End: 2015-03-05
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
## 2012-12-20 ENCOUNTER — Encounter (HOSPITAL_COMMUNITY): Payer: Self-pay | Admitting: Emergency Medicine

## 2012-12-20 ENCOUNTER — Emergency Department (INDEPENDENT_AMBULATORY_CARE_PROVIDER_SITE_OTHER)
Admission: EM | Admit: 2012-12-20 | Discharge: 2012-12-20 | Disposition: A | Discharge status: Home / Self Care | Payer: Medicare Other | Source: Home / Self Care

## 2012-12-20 DIAGNOSIS — J019 Acute sinusitis, unspecified: Secondary | ICD-10-CM

## 2012-12-20 DIAGNOSIS — R05 Cough: Secondary | ICD-10-CM

## 2012-12-20 DIAGNOSIS — R0982 Postnasal drip: Secondary | ICD-10-CM

## 2012-12-20 MED ORDER — AMOXICILLIN-POT CLAVULANATE 875-125 MG PO TABS: 1.0000 | ORAL_TABLET | Freq: Two times a day (BID) | ORAL | Status: DC

## 2012-12-20 MED ORDER — PHENYLEPHRINE-CHLORPHEN-DM 10-4-12.5 MG/5ML PO LIQD: 5.0000 mL | ORAL | Status: DC | PRN

## 2012-12-20 NOTE — ED Provider Notes (Signed)
CSN: [id number]     Arrival date & time 12/20/12  1015 History   First MD Initiated Contact with Patient 12/20/12 1047     Chief Complaint  Patient presents with  . Cough    x 6 wks. no relief with otc meds   (Consider location/radiation/quality/duration/timing/severity/associated sxs/prior Treatment) HPI Comments: 57 year old male plenty of a cough for 6 weeks. This is associated with PND. He denies fever or shortness of breath at rest. However, when trying to clean the house he states that he gets a little short of breath. He has not attempted to take any medications for symptoms. He is a former smoker but stopped in 1988.   Past Medical History  Diagnosis Date  . Knee pain   . Hypertension   . Arthritis   . Chronic low back pain     Is supposed to have surgery soon at L4-5   Past Surgical History  Procedure Laterality Date  . Knee surgery  08    rt arthroscopy  . 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 Joint replacement      knee 05/2011   History reviewed. No pertinent family history. History  Substance Use Topics  . Smoking status: Former Smoker -- 0.50 packs/day    Quit date: 04/27/1986  . Smokeless tobacco: Not on file  . Alcohol Use: No    Review of Systems  Constitutional: Positive for activity change. Negative for fever, diaphoresis and fatigue.  HENT: Positive for postnasal drip. Negative for ear pain, facial swelling, sore throat and trouble swallowing.   Eyes: Negative for pain, discharge and redness.  Respiratory: Positive for cough. Negative for chest tightness, shortness of breath and wheezing.   Cardiovascular: Negative.   Gastrointestinal: Negative.   Musculoskeletal: Negative.  Negative for neck pain and neck stiffness.  Neurological: Negative.     Allergies  Review of patient's  allergies indicates no known allergies.  Home Medications   Current Outpatient Rx  Name  Route  Sig  Dispense  Refill  . hydrochlorothiazide (HYDRODIURIL) 25 MG tablet   Oral   Take 25 mg by mouth daily.          . sildenafil (VIAGRA) 100 MG tablet   Oral   Take 1 tablet (100 mg total) by mouth daily as needed for erectile dysfunction.   10 tablet   12   . amoxicillin-clavulanate (AUGMENTIN) 875-125 MG per tablet   Oral   Take 1 tablet by mouth every 12 (twelve) hours.   20 tablet   0   . cloNIDine (CATAPRES) 0.1 MG tablet   Oral   Take 0.1 mg by mouth 2 (two) times daily.          . cyclobenzaprine (FLEXERIL) 5 MG tablet   Oral   Take 1 tablet (5 mg total) by mouth 3 (three) times daily as needed for muscle spasms.   30 tablet   0   . HYDROcodone-acetaminophen (NORCO) 10-325 MG per tablet   Oral   Take 1 tablet by mouth every 6 (six) hours as needed. For pain.         Marland Kitchen HYDROcodone-acetaminophen (NORCO/VICODIN) 5-325 MG per tablet      1 to 2 tabs every 4 to 6 hours as needed for pain.   20 tablet   0   .  meloxicam (MOBIC) 15 MG tablet   Oral   Take 1 tablet (15 mg total) by mouth daily.   15 tablet   0   . naphazoline-pheniramine (NAPHCON-A) 0.025-0.3 % ophthalmic solution   Both Eyes   Place 1 drop into both eyes daily as needed. For red eyes.         Marland Kitchen Phenylephrine-Chlorphen-DM 12-17-10.5 MG/5ML LIQD   Oral   Take 5 mLs by mouth every 4 (four) hours as needed.   120 mL   0   . potassium chloride (K-DUR) 10 MEQ tablet   Oral   Take 10 mEq by mouth daily.          . rosuvastatin (CRESTOR) 10 MG tablet   Oral   Take 10 mg by mouth daily.         . traZODone (DESYREL) 150 MG tablet   Oral   Take 150 mg by mouth at bedtime.         . TraZODone HCl 150 MG TB24   Oral   Take 1 tablet (150 mg total) by mouth at bedtime as needed (sleep).   20 tablet   0    BP 132/96  Pulse 71  Temp(Src) 98.2 F (36.8 C) (Oral)  Resp 20  SpO2  97% Physical Exam  Nursing note and vitals reviewed. Constitutional: He is oriented to person, place, and time. He appears well-developed and well-nourished. No distress.  HENT:  Bilateral TMs are normal Oropharynx with minor erythema. There is copious amount of thick, pale green purulent exudates draining from the nasopharynx.  Eyes: Conjunctivae and EOM are normal.  Neck: Normal range of motion. Neck supple.  Cardiovascular: Normal rate, regular rhythm and normal heart sounds.   Pulmonary/Chest: Effort normal and breath sounds normal. No respiratory distress. He has no wheezes. He has no rales.  Musculoskeletal: Normal range of motion. He exhibits no edema.  Lymphadenopathy:    He has no cervical adenopathy.  Neurological: He is alert and oriented to person, place, and time.  Skin: Skin is warm and dry. No rash noted.  Psychiatric: He has a normal mood and affect.    ED Course  Procedures (including critical care time) Labs Review Labs Reviewed - No data to display Imaging Review No results found.  MDM   1. Acute sinusitis with symptoms greater than 10 days   2. Cough   3. PND (post-nasal drip)      Augmentin 8 75 one twice a day for 10 days Norell CS 1 teaspoon every 4-6 hours when necessary cough and drainage Drink plenty of fluids followup with your PCP. In July you were given information to [facility] and given information to obtain an orange card and the number to an appointment.    Hayden Rasmussen, NP 12/20/12 1120

## 2012-12-20 NOTE — ED Notes (Signed)
C/o productive cough with green sputum x 6 wks.  Chest and nasal congestion denies fever. Tried otc meds with no relief. Denies sob.   Experiencing pain with deep coughing.

## 2012-12-20 NOTE — ED Provider Notes (Signed)
Medical screening examination/treatment/procedure(s) were performed by non-physician practitioner and as supervising physician I was immediately available for consultation/collaboration.  Leslee Home, M.D.  Reuben Likes, MD 12/20/12 1153

## 2012-12-23 ENCOUNTER — Encounter (HOSPITAL_COMMUNITY): Payer: Self-pay | Admitting: Emergency Medicine

## 2013-01-11 ENCOUNTER — Emergency Department (INDEPENDENT_AMBULATORY_CARE_PROVIDER_SITE_OTHER): Payer: Medicare Other

## 2013-01-11 ENCOUNTER — Emergency Department (INDEPENDENT_AMBULATORY_CARE_PROVIDER_SITE_OTHER)
Admission: EM | Admit: 2013-01-11 | Discharge: 2013-01-11 | Disposition: A | Discharge status: Home / Self Care | Payer: Medicare Other | Source: Home / Self Care | Attending: Family Medicine | Admitting: Family Medicine

## 2013-01-11 ENCOUNTER — Encounter (HOSPITAL_COMMUNITY): Payer: Self-pay | Admitting: Emergency Medicine

## 2013-01-11 DIAGNOSIS — M19072 Primary osteoarthritis, left ankle and foot: Secondary | ICD-10-CM

## 2013-01-11 DIAGNOSIS — M25579 Pain in unspecified ankle and joints of unspecified foot: Secondary | ICD-10-CM

## 2013-01-11 DIAGNOSIS — S335XXA Sprain of ligaments of lumbar spine, initial encounter: Secondary | ICD-10-CM

## 2013-01-11 DIAGNOSIS — M19079 Primary osteoarthritis, unspecified ankle and foot: Secondary | ICD-10-CM

## 2013-01-11 DIAGNOSIS — G8929 Other chronic pain: Secondary | ICD-10-CM

## 2013-01-11 MED ORDER — PREDNISONE 10 MG PO KIT: PACK | ORAL | Status: DC

## 2013-01-11 NOTE — ED Provider Notes (Signed)
Eric Macdonald is a 57 y.o. male who presents to Urgent Care today for left ankle and foot pain present for about 3 weeks without injury. Pain worsened after he increases activity temporarily. The pain is located on the medial aspect of his ankle and foot. He denies any radiating pain weakness numbness fevers or chills. He denies any significant swelling. He's tried some over-the-counter pain medications which will help a little. He feels well otherwise. He has a history of right knee replacement by Dr. Eulah Pont at [facility].   Past Medical History  Diagnosis Date  . Knee pain   . Hypertension   . Arthritis   . Chronic low back pain     Is supposed to have surgery soon at L4-5   History  Substance Use Topics  . Smoking status: Former Smoker -- 0.50 packs/day    Quit date: 04/27/1986  . Smokeless tobacco: Not on file  . Alcohol Use: No   ROS as above Medications reviewed. No current facility-administered medications for this encounter.   Current Outpatient Prescriptions  Medication Sig Dispense Refill  . cloNIDine (CATAPRES) 0.1 MG tablet Take 0.1 mg by mouth 2 (two) times daily.       . cyclobenzaprine (FLEXERIL) 5 MG tablet Take 1 tablet (5 mg total) by mouth 3 (three) times daily as needed for muscle spasms.  30 tablet  0  . hydrochlorothiazide (HYDRODIURIL) 25 MG tablet Take 25 mg by mouth daily.       . naphazoline-pheniramine (NAPHCON-A) 0.025-0.3 % ophthalmic solution Place 1 drop into both eyes daily as needed. For red eyes.      Marland Kitchen Phenylephrine-Chlorphen-DM 12-17-10.5 MG/5ML LIQD Take 5 mLs by mouth every 4 (four) hours as needed.  120 mL  0  . potassium chloride (K-DUR) 10 MEQ tablet Take 10 mEq by mouth daily.       . PredniSONE 10 MG KIT 12 day dose pack po  1 kit  0  . rosuvastatin (CRESTOR) 10 MG tablet Take 10 mg by mouth daily.      . sildenafil (VIAGRA) 100 MG tablet Take 1 tablet (100 mg total) by mouth daily as needed for erectile dysfunction.  10 tablet   12  . traZODone (DESYREL) 150 MG tablet Take 150 mg by mouth at bedtime.      . TraZODone HCl 150 MG TB24 Take 1 tablet (150 mg total) by mouth at bedtime as needed (sleep).  20 tablet  0    Exam:  BP 146/104  Pulse 62  Temp(Src) 98 F (36.7 C) (Oral)  Resp 16  SpO2 98% Gen: Well NAD LEFT ANKLE: Mild effusion. Tender palpation over the medial malleolus and course of the posterior tibialis tendon Normal motion. Strength is intact without pain. Capillary refill sensation is intact distally.  Right ankle: Normal-appearing nontender normal motion normal strength  Capillary refill sensation are intact bilateral lower extremity    : No results found for this or any previous visit (from the past 24 hour(s)). Dg Ankle Complete Left  01/11/2013   CLINICAL DATA:  Pain  EXAM: LEFT ANKLE COMPLETE - 3+ VIEW  COMPARISON:  None.  FINDINGS: Frontal, oblique, and lateral views were obtained. There is no the acute fracture or effusion. Ankle mortise appears intact. There is a spur arising from the inferior calcaneus. There are calcifications adjacent to the cuboid which could represent arthropathy or residua of old trauma.  IMPRESSION: There are calcifications adjacent to the cuboid, possibly representing residua of  old trauma. No acute fracture or effusion. Ankle mortise appears intact.   Electronically Signed   By: Bretta Bang M.D.   On: 01/11/2013 16:08    Assessment and Plan: 57 y.o. male with left ankle pain. 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 Plan ASO type ankle brace and prednisone course. We'll followup with Dr. Farris Has at [facility] if not improved. Discussed warning signs or symptoms. Please see discharge instructions. Patient expresses understanding.      Rodolph Bong, MD 01/11/13 [phone]

## 2013-01-11 NOTE — ED Notes (Signed)
Pt c/o left foot/ankle pain onset 3 weeks Denies: inj/trauma, swelling.... but reports he was helping a friend cut trees Pain increases w/activity ... Ambulated to exam room w/NAD Alert w/no signs of acute distress.

## 2013-01-24 IMAGING — CR DG KNEE 1-2V PORT*R*
2 series · 2 of 2 positions shown · non-contrast
Comparison: None.

CLINICAL DATA: 55-year-old male status post knee arthroplasty.

PORTABLE RIGHT KNEE - 1-2 VIEW

[ap/obl knee]
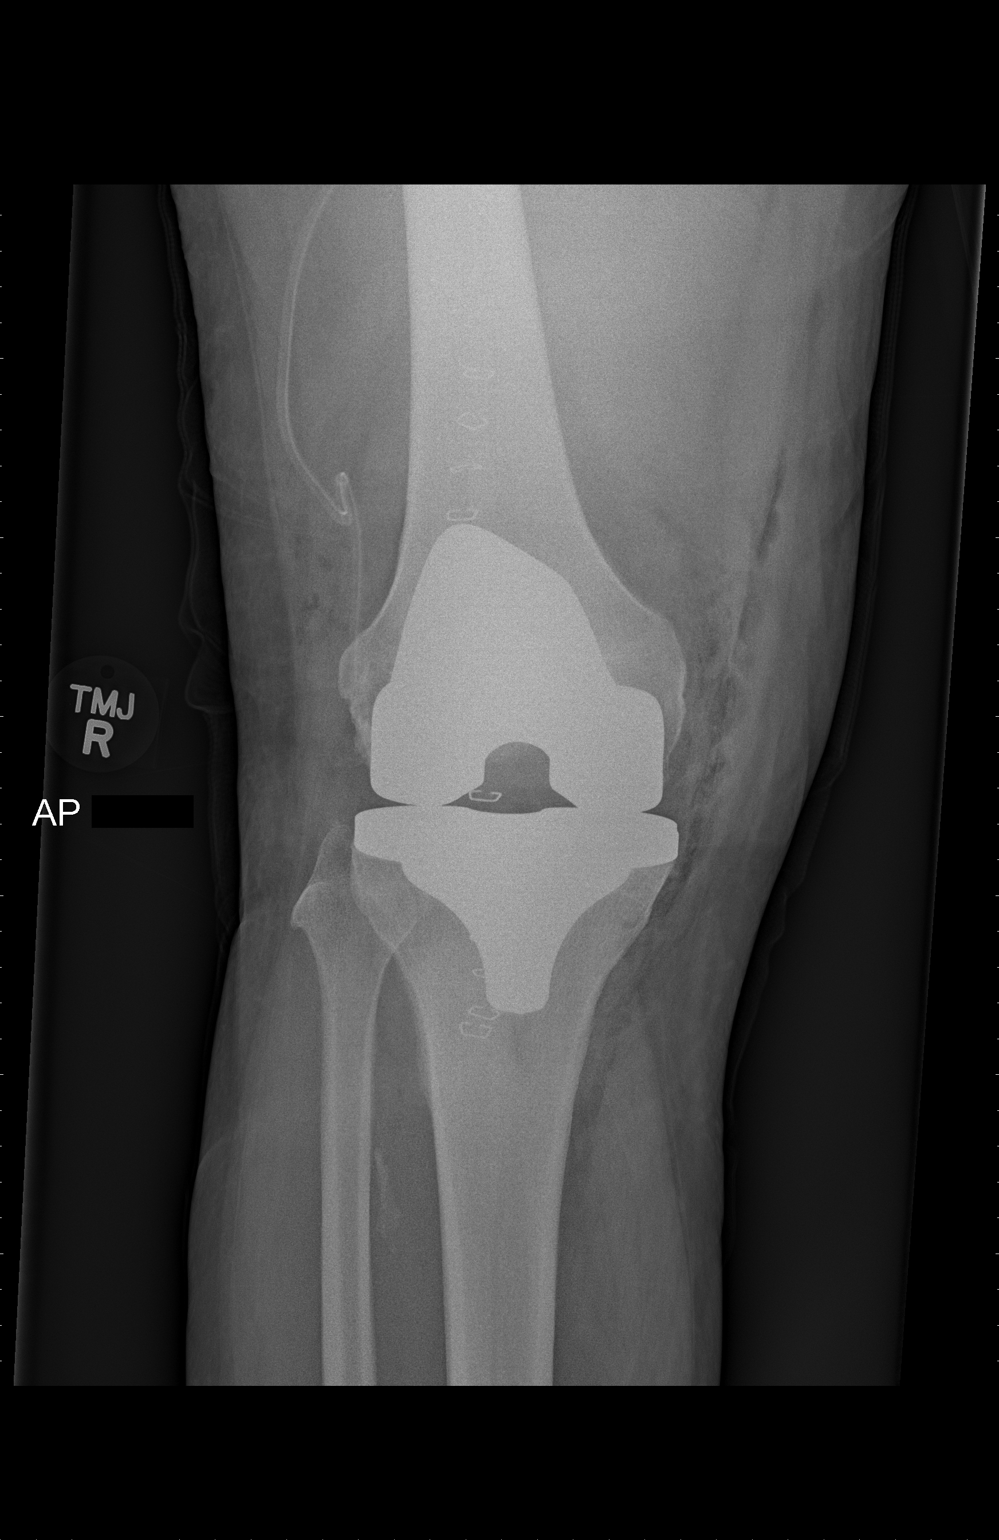

[knee lat]
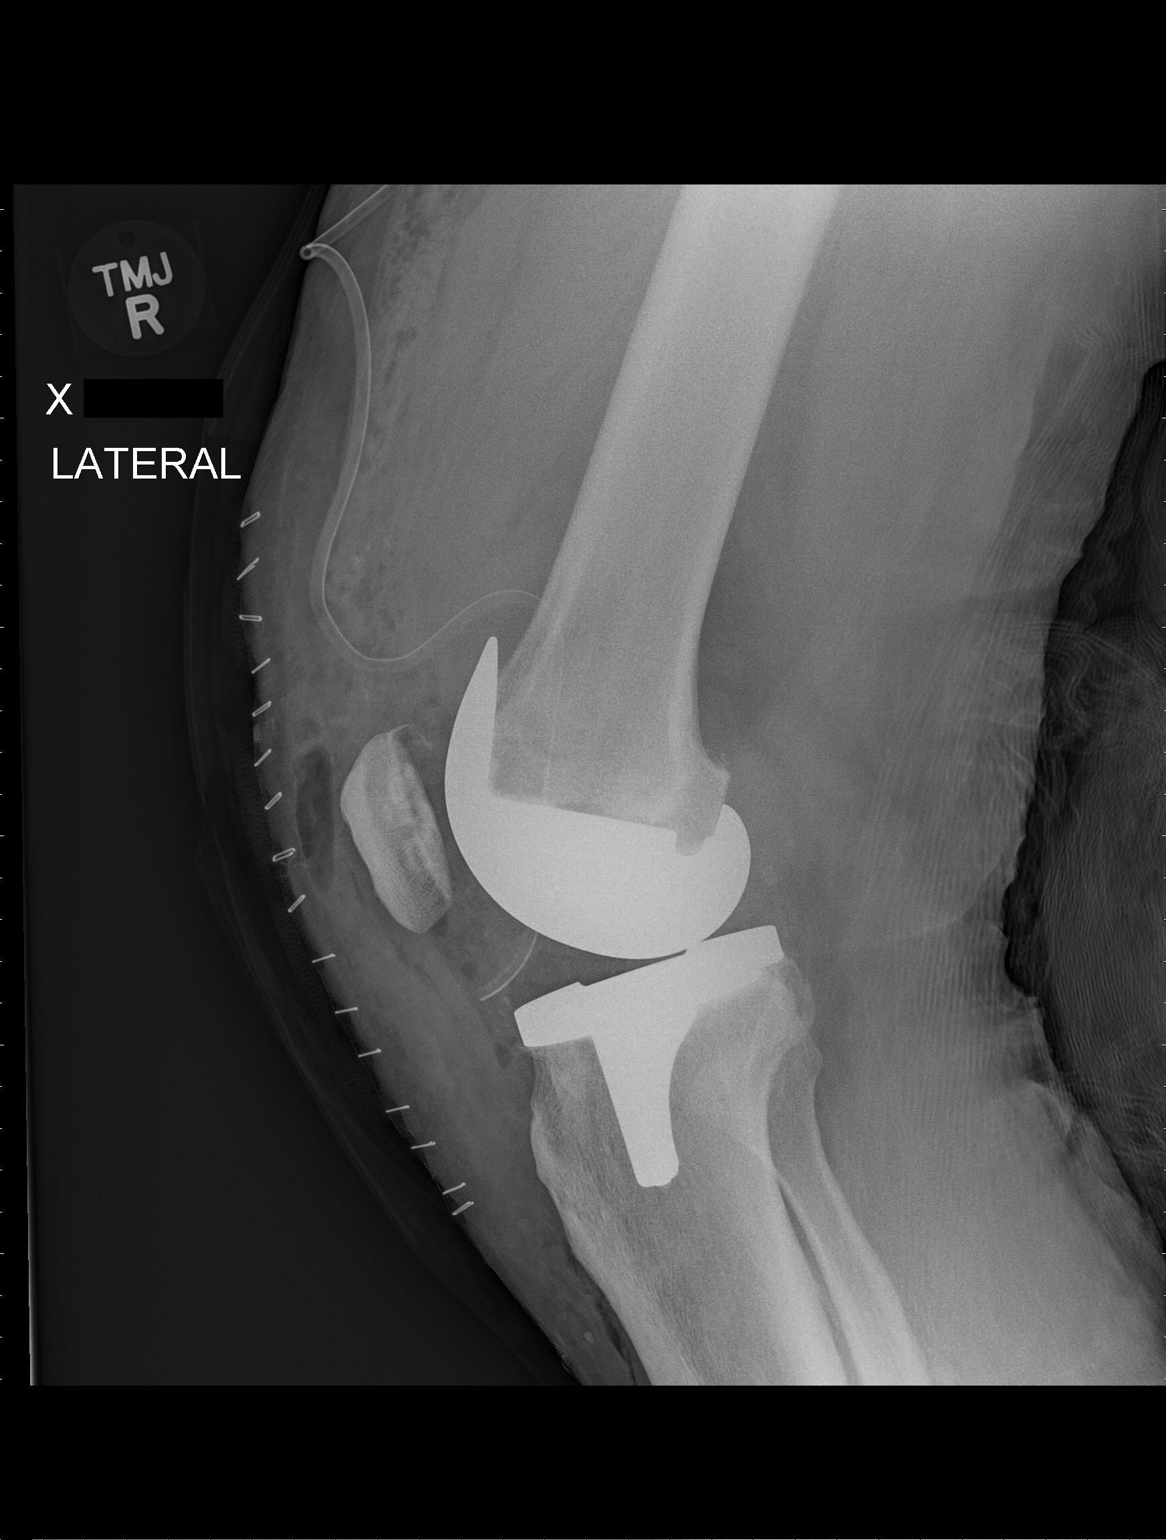

[2 of 2 positions shown; findings below may reference images not displayed]

FINDINGS: AP portable cross-table lateral views of the right knee.
Sequelae of total knee arthroplasty.  Postoperative drain in place.
Hardware components appear intact and normally aligned.  No
unexpected osseous changes.  Midline skin staples and postoperative
changes to the soft tissues.
IMPRESSION: Right knee arthroplasty with no adverse features.

## 2013-02-01 ENCOUNTER — Emergency Department (INDEPENDENT_AMBULATORY_CARE_PROVIDER_SITE_OTHER)
Admission: EM | Admit: 2013-02-01 | Discharge: 2013-02-01 | Disposition: A | Discharge status: Home / Self Care | Payer: Medicare Other | Source: Home / Self Care | Attending: Family Medicine | Admitting: Family Medicine

## 2013-02-01 ENCOUNTER — Encounter (HOSPITAL_COMMUNITY): Payer: Self-pay | Admitting: Emergency Medicine

## 2013-02-01 DIAGNOSIS — I10 Essential (primary) hypertension: Secondary | ICD-10-CM

## 2013-02-01 MED ORDER — AMLODIPINE BESYLATE 5 MG PO TABS: 5.0000 mg | ORAL_TABLET | Freq: Every day | ORAL | Status: DC

## 2013-02-01 MED ORDER — LISINOPRIL-HYDROCHLOROTHIAZIDE 10-12.5 MG PO TABS: 1.0000 | ORAL_TABLET | Freq: Every day | ORAL | Status: DC

## 2013-02-01 NOTE — ED Notes (Signed)
Pt is here for HTN... Reports he has ran out of his BP meds x2 weeks Feeling light headed and having a HA and blurry vision Denies: CP, SOB, nauseas, diaphoresis, weakness Alert w/no signs of acute distress.

## 2013-02-01 NOTE — ED Provider Notes (Signed)
CSN: [id number]     Arrival date & time 02/01/13  1338 History   First MD Initiated Contact with Patient 02/01/13 1516     Chief Complaint  Patient presents with  . Hypertension   (Consider location/radiation/quality/duration/timing/severity/associated sxs/prior Treatment) Patient is a 57 y.o. male presenting with hypertension. The history is provided by the patient.  Hypertension This is a chronic problem. The current episode started more than 1 week ago (out of meds for over 2 weeks , here for refill, bp has been a little high ). The problem has not changed since onset.Pertinent negatives include no chest pain, no abdominal pain and no shortness of breath.    Past Medical History  Diagnosis Date  . Knee pain   . Hypertension   . Arthritis   . Chronic low back pain     Is supposed to have surgery soon at L4-5   Past Surgical History  Procedure Laterality Date  . Knee surgery  08    rt arthroscopy  . 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 Joint replacement      knee 05/2011   No family history on file. History  Substance Use Topics  . Smoking status: Former Smoker -- 0.50 packs/day    Quit date: 04/27/1986  . Smokeless tobacco: Not on file  . Alcohol Use: No    Review of Systems  Constitutional: Negative.   Respiratory: Negative.  Negative for shortness of breath.   Cardiovascular: Negative.  Negative for chest pain.  Gastrointestinal: Negative for abdominal pain.  Neurological: Negative.     Allergies  Review of patient's allergies indicates no known allergies.  Home Medications   Current Outpatient Rx  Name  Route  Sig  Dispense  Refill  . hydrochlorothiazide (HYDRODIURIL) 25 MG tablet   Oral   Take 25 mg by mouth daily.          . traZODone (DESYREL) 150 MG tablet   Oral   Take 150  mg by mouth at bedtime.         Marland Kitchen amLODipine (NORVASC) 5 MG tablet   Oral   Take 1 tablet (5 mg total) by mouth daily.   30 tablet   1   . cloNIDine (CATAPRES) 0.1 MG tablet   Oral   Take 0.1 mg by mouth 2 (two) times daily.          . cyclobenzaprine (FLEXERIL) 5 MG tablet   Oral   Take 1 tablet (5 mg total) by mouth 3 (three) times daily as needed for muscle spasms.   30 tablet   0   . lisinopril-hydrochlorothiazide (PRINZIDE,ZESTORETIC) 10-12.5 MG per tablet   Oral   Take 1 tablet by mouth daily.   30 tablet   1   . naphazoline-pheniramine (NAPHCON-A) 0.025-0.3 % ophthalmic solution   Both Eyes   Place 1 drop into both eyes daily as needed. For red eyes.         Marland Kitchen Phenylephrine-Chlorphen-DM 12-17-10.5 MG/5ML LIQD   Oral   Take 5 mLs by mouth every 4 (four) hours as needed.   120 mL   0   . potassium chloride (K-DUR) 10 MEQ tablet   Oral   Take 10 mEq by mouth daily.          . PredniSONE 10  MG KIT      12 day dose pack po   1 kit   0   . rosuvastatin (CRESTOR) 10 MG tablet   Oral   Take 10 mg by mouth daily.         . sildenafil (VIAGRA) 100 MG tablet   Oral   Take 1 tablet (100 mg total) by mouth daily as needed for erectile dysfunction.   10 tablet   12   . TraZODone HCl 150 MG TB24   Oral   Take 1 tablet (150 mg total) by mouth at bedtime as needed (sleep).   20 tablet   0    BP 153/110  Pulse 80  Temp(Src) 98.3 F (36.8 C) (Oral)  Resp 24  SpO2 100% Physical Exam  Nursing note and vitals reviewed. Constitutional: He is oriented to person, place, and time. He appears well-developed and well-nourished.  Eyes: EOM are normal. Pupils are equal, round, and reactive to light.  Neck: Normal range of motion. Neck supple.  Cardiovascular: Regular rhythm and normal heart sounds.   Pulmonary/Chest: Effort normal and breath sounds normal.  Abdominal: Soft. Bowel sounds are normal.  Musculoskeletal: He exhibits no edema.  Neurological: He  is alert and oriented to person, place, and time.  Skin: Skin is warm and dry.    ED Course  Procedures (including critical care time) Labs Review Labs Reviewed - No data to display Imaging Review No results found.  EKG Interpretation    Date/Time:    Ventricular Rate:    PR Interval:    QRS Duration:   QT Interval:    QTC Calculation:   R Axis:     Text Interpretation:              MDM      Linna Hoff, MD 02/01/13 1536

## 2013-02-12 ENCOUNTER — Encounter (HOSPITAL_COMMUNITY): Payer: Self-pay | Admitting: Emergency Medicine

## 2013-02-12 ENCOUNTER — Emergency Department (INDEPENDENT_AMBULATORY_CARE_PROVIDER_SITE_OTHER)
Admission: EM | Admit: 2013-02-12 | Discharge: 2013-02-12 | Disposition: A | Discharge status: Home / Self Care | Payer: Medicare Other | Source: Home / Self Care | Attending: Emergency Medicine | Admitting: Emergency Medicine

## 2013-02-12 DIAGNOSIS — G4700 Insomnia, unspecified: Secondary | ICD-10-CM

## 2013-02-12 MED ORDER — TRAZODONE HCL ER 150 MG PO TB24: 1.0000 | ORAL_TABLET | Freq: Every evening | ORAL | Status: DC | PRN

## 2013-02-12 NOTE — ED Notes (Signed)
Out of his medication he uses for sleep and anxiety; denies passing out. Had several episodes of dizziness last PM, but denies passing out; denies pain

## 2013-02-12 NOTE — ED Provider Notes (Signed)
Medical screening examination/treatment/procedure(s) were performed by non-physician practitioner and as supervising physician I was immediately available for consultation/collaboration.  Leslee Home, M.D.  Reuben Likes, MD 02/12/13 [phone]

## 2013-02-12 NOTE — ED Provider Notes (Signed)
CSN: [id number]     Arrival date & time 02/12/13  0845 History   First MD Initiated Contact with Patient 02/12/13 [phone]     Chief Complaint  Patient presents with  . Anxiety    dizzy   (Consider location/radiation/quality/duration/timing/severity/associated sxs/prior Treatment) Patient is a 57 y.o. male presenting with anxiety. The history is provided by the patient. No language interpreter was used.  Anxiety This is a new problem. The current episode started more than 1 week ago. The problem occurs constantly. The problem has been gradually worsening. Pertinent negatives include no abdominal pain. Nothing aggravates the symptoms. Nothing relieves the symptoms. He has tried nothing for the symptoms.  Pt complains of insomnia.  He is out of trazadone  Past Medical History  Diagnosis Date  . Knee pain   . Hypertension   . Arthritis   . Chronic low back pain     Is supposed to have surgery soon at L4-5   Past Surgical History  Procedure Laterality Date  . Knee surgery  08    rt arthroscopy  . 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 Joint replacement      knee 05/2011   History reviewed. No pertinent family history. History  Substance Use Topics  . Smoking status: Former Smoker -- 0.50 packs/day    Quit date: 04/27/1986  . Smokeless tobacco: Not on file  . Alcohol Use: No    Review of Systems  Gastrointestinal: Negative for abdominal pain.  Psychiatric/Behavioral: Positive for sleep disturbance. The patient is nervous/anxious.   All other systems reviewed and are negative.    Allergies  Review of patient's allergies indicates no known allergies.  Home Medications   Current Outpatient Rx  Name  Route  Sig  Dispense  Refill  . amLODipine (NORVASC) 5 MG tablet   Oral   Take 1 tablet (5 mg total) by  mouth daily.   30 tablet   1   . cloNIDine (CATAPRES) 0.1 MG tablet   Oral   Take 0.1 mg by mouth 2 (two) times daily.          . cyclobenzaprine (FLEXERIL) 5 MG tablet   Oral   Take 1 tablet (5 mg total) by mouth 3 (three) times daily as needed for muscle spasms.   30 tablet   0   . hydrochlorothiazide (HYDRODIURIL) 25 MG tablet   Oral   Take 25 mg by mouth daily.          Marland Kitchen lisinopril-hydrochlorothiazide (PRINZIDE,ZESTORETIC) 10-12.5 MG per tablet   Oral   Take 1 tablet by mouth daily.   30 tablet   1   . naphazoline-pheniramine (NAPHCON-A) 0.025-0.3 % ophthalmic solution   Both Eyes   Place 1 drop into both eyes daily as needed. For red eyes.         Marland Kitchen Phenylephrine-Chlorphen-DM 12-17-10.5 MG/5ML LIQD   Oral   Take 5 mLs by mouth every 4 (four) hours as needed.   120 mL   0   . potassium chloride (K-DUR) 10 MEQ tablet   Oral   Take 10 mEq by mouth daily.          . PredniSONE 10 MG KIT      12 day dose pack po   1 kit   0   .  rosuvastatin (CRESTOR) 10 MG tablet   Oral   Take 10 mg by mouth daily.         . sildenafil (VIAGRA) 100 MG tablet   Oral   Take 1 tablet (100 mg total) by mouth daily as needed for erectile dysfunction.   10 tablet   12   . traZODone (DESYREL) 150 MG tablet   Oral   Take 150 mg by mouth at bedtime.         . TraZODone HCl 150 MG TB24   Oral   Take 1 tablet (150 mg total) by mouth at bedtime as needed (sleep).   20 tablet   0    BP 140/99  Pulse 86  Temp(Src) 98.7 F (37.1 C) (Oral)  Resp 18  SpO2 98% Physical Exam  Nursing note and vitals reviewed. Constitutional: He appears well-developed and well-nourished.  HENT:  Head: Normocephalic.  Eyes: Pupils are equal, round, and reactive to light.  Neck: Normal range of motion.  Cardiovascular: Normal rate and normal heart sounds.   Pulmonary/Chest: He is in respiratory distress.  Abdominal: Soft.  Musculoskeletal: Normal range of motion.  Neurological:  He is alert.  Skin: Skin is warm.  Psychiatric: He has a normal mood and affect.    ED Course  Procedures (including critical care time) Labs Review Labs Reviewed - No data to display Imaging Review No results found.  EKG Interpretation    Date/Time:    Ventricular Rate:    PR Interval:    QRS Duration:   QT Interval:    QTC Calculation:   R Axis:     Text Interpretation:              MDM   1. Insomnia   trazadone    Elson Areas, PA-C 02/12/13 0955  Elson Areas, PA-C 02/12/13 [phone]

## 2013-06-11 ENCOUNTER — Emergency Department (INDEPENDENT_AMBULATORY_CARE_PROVIDER_SITE_OTHER)
Admission: EM | Admit: 2013-06-11 | Discharge: 2013-06-11 | Disposition: A | Discharge status: Home / Self Care | Payer: Medicare Other | Source: Home / Self Care | Attending: Emergency Medicine | Admitting: Emergency Medicine

## 2013-06-11 ENCOUNTER — Encounter (HOSPITAL_COMMUNITY): Payer: Self-pay | Admitting: Emergency Medicine

## 2013-06-11 DIAGNOSIS — G4700 Insomnia, unspecified: Secondary | ICD-10-CM

## 2013-06-11 MED ORDER — TRAZODONE HCL 150 MG PO TABS
300.0000 mg | ORAL_TABLET | Freq: Every day | ORAL | Status: DC
Start: 2013-06-11 — End: 2015-03-05

## 2013-06-11 NOTE — Discharge Instructions (Signed)

## 2013-06-11 NOTE — ED Provider Notes (Signed)
CSN: [id number]     Arrival date & time 06/11/13  1353 History   None    Chief Complaint  Patient presents with  . Insomnia   (Consider location/radiation/quality/duration/timing/severity/associated sxs/prior Treatment) HPI Comments: 58 year old male presents complaining of insomnia for the past year and a half. He has a history of insomnia and has taken trazodone, 300 mg each bedtime before in the past with good results. he is out of his medicine and he is requesting more. He relates this to a recent history of increased stress at home. Denies any new symptoms at this time.   Past Medical History  Diagnosis Date  . Knee pain   . Hypertension   . Arthritis   . Chronic low back pain     Is supposed to have surgery soon at L4-5   Past Surgical History  Procedure Laterality Date  . Knee surgery  08    rt arthroscopy  . 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 Joint replacement      knee 05/2011   No family history on file. History  Substance Use Topics  . Smoking status: Former Smoker -- 0.50 packs/day    Quit date: 04/27/1986  . Smokeless tobacco: Not on file  . Alcohol Use: No    Review of Systems  Psychiatric/Behavioral: Positive for sleep disturbance. Negative for suicidal ideas and self-injury. The patient is nervous/anxious.   All other systems reviewed and are negative.    Allergies  Review of patient's allergies indicates no known allergies.  Home Medications   Current Outpatient Rx  Name  Route  Sig  Dispense  Refill  . TraZODone HCl 150 MG TB24   Oral   Take 1 tablet (150 mg total) by mouth at bedtime as needed (sleep).   30 tablet   1   . amLODipine (NORVASC) 5 MG tablet   Oral   Take 1 tablet (5 mg total) by mouth daily.   30 tablet   1   . cloNIDine (CATAPRES) 0.1 MG tablet  Oral   Take 0.1 mg by mouth 2 (two) times daily.          . cyclobenzaprine (FLEXERIL) 5 MG tablet   Oral   Take 1 tablet (5 mg total) by mouth 3 (three) times daily as needed for muscle spasms.   30 tablet   0   . hydrochlorothiazide (HYDRODIURIL) 25 MG tablet   Oral   Take 25 mg by mouth daily.          Marland Kitchen lisinopril-hydrochlorothiazide (PRINZIDE,ZESTORETIC) 10-12.5 MG per tablet   Oral   Take 1 tablet by mouth daily.   30 tablet   1   . naphazoline-pheniramine (NAPHCON-A) 0.025-0.3 % ophthalmic solution   Both Eyes   Place 1 drop into both eyes daily as needed. For red eyes.         Marland Kitchen Phenylephrine-Chlorphen-DM 12-17-10.5 MG/5ML LIQD   Oral   Take 5 mLs by mouth every 4 (four) hours as needed.   120 mL   0   . potassium chloride (K-DUR) 10 MEQ tablet   Oral   Take 10 mEq by mouth daily.          . PredniSONE 10 MG KIT      12 day dose pack po   1  kit   0   . rosuvastatin (CRESTOR) 10 MG tablet   Oral   Take 10 mg by mouth daily.         . sildenafil (VIAGRA) 100 MG tablet   Oral   Take 1 tablet (100 mg total) by mouth daily as needed for erectile dysfunction.   10 tablet   12   . traZODone (DESYREL) 150 MG tablet   Oral   Take 150 mg by mouth at bedtime.         . traZODone (DESYREL) 150 MG tablet   Oral   Take 2 tablets (300 mg total) by mouth at bedtime.   60 tablet   0    BP 143/98  Pulse 77  Temp(Src) 98.5 F (36.9 C) (Oral)  Resp 77  SpO2 99% Physical Exam  Nursing note and vitals reviewed. Constitutional: He is oriented to person, place, and time. He appears well-developed and well-nourished. No distress.  HENT:  Head: Normocephalic.  Cardiovascular: Normal rate, regular rhythm and normal heart sounds.  Exam reveals no friction rub.   No murmur heard. Pulmonary/Chest: Effort normal and breath sounds normal. No respiratory distress. He has no wheezes. He has no rales.  Neurological: He is alert and oriented to person, place,  and time. Coordination normal.  Skin: Skin is warm and dry. No rash noted. He is not diaphoretic.  Psychiatric: He has a normal mood and affect. His behavior is normal. Judgment and thought content normal.    ED Course  Procedures (including critical care time) Labs Review Labs Reviewed - No data to display Imaging Review No results found.   MDM   1. Insomnia    Trazodone refilled.  refer to sleep Center.  Meds ordered this encounter  Medications  . traZODone (DESYREL) 150 MG tablet    Sig: Take 2 tablets (300 mg total) by mouth at bedtime.    Dispense:  60 tablet    Refill:  0    Order Specific Question:  Supervising Provider    Answer:  Minna Antis  ]     Liam Graham, PA-C 06/11/13 [phone]

## 2013-06-11 NOTE — ED Provider Notes (Signed)
Medical screening examination/treatment/procedure(s) were performed by non-physician practitioner and as supervising physician I was immediately available for consultation/collaboration.  Philipp Deputy, M.D.  Harden Mo, MD 06/11/13 2216

## 2013-06-11 NOTE — ED Notes (Signed)
Pt c/o not being able to sleep well at night x1.5 yrs Seen here in the past for similar sxs Given Trazodone w/relief but he's running out.  No PCP Alert w/no signs of acute distress.

## 2013-07-13 ENCOUNTER — Emergency Department (INDEPENDENT_AMBULATORY_CARE_PROVIDER_SITE_OTHER)
Admission: EM | Admit: 2013-07-13 | Discharge: 2013-07-13 | Disposition: A | Discharge status: Home / Self Care | Payer: Medicare Other | Source: Home / Self Care | Attending: Emergency Medicine | Admitting: Emergency Medicine

## 2013-07-13 ENCOUNTER — Encounter (HOSPITAL_COMMUNITY): Payer: Self-pay | Admitting: Emergency Medicine

## 2013-07-13 DIAGNOSIS — R05 Cough: Secondary | ICD-10-CM

## 2013-07-13 DIAGNOSIS — R059 Cough, unspecified: Secondary | ICD-10-CM

## 2013-07-13 DIAGNOSIS — G4700 Insomnia, unspecified: Secondary | ICD-10-CM

## 2013-07-13 DIAGNOSIS — M549 Dorsalgia, unspecified: Secondary | ICD-10-CM

## 2013-07-13 MED ORDER — HYDROCODONE-ACETAMINOPHEN 5-325 MG PO TABS: ORAL_TABLET | ORAL | Status: DC

## 2013-07-13 MED ORDER — RANITIDINE HCL 150 MG PO TABS: 150.0000 mg | ORAL_TABLET | Freq: Two times a day (BID) | ORAL | Status: DC

## 2013-07-13 MED ORDER — TRAZODONE HCL 150 MG PO TABS: 300.0000 mg | ORAL_TABLET | Freq: Every day | ORAL | Status: DC

## 2013-07-13 NOTE — Discharge Instructions (Signed)
Do exercises twice daily followed by moist heat for 15 minutes. ° ° ° ° ° °Try to be as active as possible. ° °If no better in 2 weeks, follow up with orthopedist. ° ° °

## 2013-07-13 NOTE — ED Notes (Signed)
Reports has a hx of insomnia.  Needs refill on medication.

## 2013-07-13 NOTE — ED Provider Notes (Signed)
Chief Complaint   Chief Complaint  Patient presents with  . Medication Refill  . Insomnia    History of Present Illness   Eric Macdonald is a 58 year old male who comes in today for refill on trazodone. He's been taking 150 mg, 2 at bedtime for sleep and for depression. With this she can get 4-5 hours a sleep or not. He treats his insomnia distress, worry, anxiety. He stressed out by the fact that he's had financial problems and has difficulty supporting himself and his family. He is mildly depressed but denies any thoughts of harm to himself or anyone else. He's also had chronic lower back pain for about 10 years. He seen Dr. Gladstone Lighter for this. He has no radiation of pain into his legs, numbness, tingling, weakness, bladder or bowel dysfunction. He has high blood pressure and is on hydrochlorothiazide for that. Denies medication side effects. No chest pain or shortness of breath. He also has a chronic nocturnal cough. Denies any wheezing or shortness of breath. He's had no post nasal drip. He denies any reflux symptoms.  Review of Systems     Other than as noted above, the patient denies any of the following symptoms: Systemic:  No fever, chills, fatigue, myalgias, headache, or anorexia. Eye:  No redness, pain or drainage. ENT:  No earache, nasal congestion, rhinorrhea, sinus pressure, or sore throat. Lungs:  No cough, sputum production, wheezing, shortness of breath.  Cardiovascular:  No chest pain, palpitations, or syncope. GI:  No nausea, vomiting, abdominal pain or diarrhea. GU:  No dysuria, frequency, or hematuria. Skin:  No rash or pruritis.   [place]     Past medical history, family history, social history, meds, and allergies were reviewed.    Physical Examination    Vital signs:  BP 138/98  Pulse 80  Temp(Src) 98.7 F (37.1 C) (Oral)  Resp 16  SpO2 97% General:  Alert, in no distress. Eye:  PERRL, full EOMs.  Lids and conjunctivas were normal. ENT:  TMs and canals were  normal, without erythema or inflammation.  Nasal mucosa was clear and uncongested, without drainage.  Mucous membranes were moist.  Pharynx was clear, without exudate or drainage.  There were no oral ulcerations or lesions. Neck:  Supple, no adenopathy, tenderness or mass. Thyroid was normal. Lungs:  No respiratory distress.  Lungs were clear to auscultation, without wheezes, rales or rhonchi.  Breath sounds were clear and equal bilaterally. Heart:  Regular rhythm, without gallops, murmers or rubs. Abdomen:  Soft, flat, and non-tender to palpation.  No hepatosplenomagaly or mass. Skin:  Clear, warm, and dry, without rash or lesions.   Assessment   The primary encounter diagnosis was Insomnia. Diagnoses of Back pain and Cough were also pertinent to this visit.  Plan     1.  Meds:  The following meds were prescribed:   Discharge Medication List as of 07/13/2013  5:18 PM    START taking these medications   Details  HYDROcodone-acetaminophen (NORCO/VICODIN) 5-325 MG per tablet 1 to 2 tabs every 4 to 6 hours as needed for pain., Print    ranitidine (ZANTAC) 150 MG tablet Take 1 tablet (150 mg total) by mouth 2 (two) times daily., Starting 07/13/2013, Until Discontinued, Normal    !! traZODone (DESYREL) 150 MG tablet Take 2 tablets (300 mg total) by mouth at bedtime., Starting 07/13/2013, Until Discontinued, Normal     !! - Potential duplicate medications found. Please discuss with provider.      2.  Patient Education/Counseling:  The patient was given appropriate handouts, self care instructions, and instructed in symptomatic relief.    3.  Follow up:  The patient was told to follow up here if no better in 3 to 4 days, or sooner if becoming worse in any way, and given some red flag symptoms such as worsening depression, suicidal thoughts, chest pain, or shortness of breath which would prompt immediate return.  Follow up at [facility].        Harden Mo, MD 07/13/13  2232

## 2013-07-23 DIAGNOSIS — F32A Depression, unspecified: Secondary | ICD-10-CM | POA: Diagnosis present

## 2013-07-23 DIAGNOSIS — F322 Major depressive disorder, single episode, severe without psychotic features: Secondary | ICD-10-CM | POA: Insufficient documentation

## 2013-07-23 DIAGNOSIS — G4700 Insomnia, unspecified: Secondary | ICD-10-CM | POA: Diagnosis present

## 2013-08-06 ENCOUNTER — Emergency Department (INDEPENDENT_AMBULATORY_CARE_PROVIDER_SITE_OTHER)
Admission: EM | Admit: 2013-08-06 | Discharge: 2013-08-06 | Disposition: A | Discharge status: Home / Self Care | Payer: Medicare Other | Source: Home / Self Care | Attending: Family Medicine | Admitting: Family Medicine

## 2013-08-06 ENCOUNTER — Encounter (HOSPITAL_COMMUNITY): Payer: Self-pay | Admitting: Emergency Medicine

## 2013-08-06 DIAGNOSIS — M549 Dorsalgia, unspecified: Secondary | ICD-10-CM

## 2013-08-06 MED ORDER — HYDROCODONE-ACETAMINOPHEN 5-325 MG PO TABS: 1.0000 | ORAL_TABLET | Freq: Every evening | ORAL | Status: DC | PRN

## 2013-08-06 NOTE — ED Notes (Signed)
Pt c/o chronic back pain onset 2 months Hx of bulging disc; works in [employer] business Denies urinary sx Alert w/no signs of acute distress.

## 2013-08-06 NOTE — ED Provider Notes (Signed)
Eric Macdonald is a 58 y.o. male who presents to Urgent Care today for chronic back pain. 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 He owes money to primary care provider is unable to see him. He notes that hydrocodone daily helps to alleviate his pain. He has Medicare but has been unable to establish with a new doctor or a pain management physician. He denies any significant radiating pain weakness or numbness. He feels well otherwise.   Past Medical History  Diagnosis Date  . Knee pain   . Hypertension   . Arthritis   . Chronic low back pain     Is supposed to have surgery soon at L4-5   History  Substance Use Topics  . Smoking status: Former Smoker -- 0.50 packs/day    Quit date: 04/27/1986  . Smokeless tobacco: Not on file  . Alcohol Use: No   ROS as above Medications: No current facility-administered medications for this encounter.   Current Outpatient Prescriptions  Medication Sig Dispense Refill  . amLODipine (NORVASC) 5 MG tablet Take 1 tablet (5 mg total) by mouth daily.  30 tablet  1  . hydrochlorothiazide (HYDRODIURIL) 25 MG tablet Take 25 mg by mouth daily.       . cloNIDine (CATAPRES) 0.1 MG tablet Take 0.1 mg by mouth 2 (two) times daily.       . cyclobenzaprine (FLEXERIL) 5 MG tablet Take 1 tablet (5 mg total) by mouth 3 (three) times daily as needed for muscle spasms.  30 tablet  0  . HYDROcodone-acetaminophen (NORCO/VICODIN) 5-325 MG per tablet 1 to 2 tabs every 4 to 6 hours as needed for pain.  20 tablet  0  . HYDROcodone-acetaminophen (NORCO/VICODIN) 5-325 MG per tablet Take 1 tablet by mouth at bedtime as needed.  30 tablet  0  . lisinopril-hydrochlorothiazide (PRINZIDE,ZESTORETIC) 10-12.5 MG per tablet Take 1 tablet by mouth daily.  30 tablet  1  . naphazoline-pheniramine (NAPHCON-A) 0.025-0.3 % ophthalmic solution Place 1 drop into both eyes daily as needed. For red eyes.      Marland Kitchen Phenylephrine-Chlorphen-DM 12-17-10.5 MG/5ML LIQD Take  5 mLs by mouth every 4 (four) hours as needed.  120 mL  0  . potassium chloride (K-DUR) 10 MEQ tablet Take 10 mEq by mouth daily.       . PredniSONE 10 MG KIT 12 day dose pack po  1 kit  0  . ranitidine (ZANTAC) 150 MG tablet Take 1 tablet (150 mg total) by mouth 2 (two) times daily.  60 tablet  5  . rosuvastatin (CRESTOR) 10 MG tablet Take 10 mg by mouth daily.      . sildenafil (VIAGRA) 100 MG tablet Take 1 tablet (100 mg total) by mouth daily as needed for erectile dysfunction.  10 tablet  12  . traZODone (DESYREL) 150 MG tablet Take 150 mg by mouth at bedtime.      . traZODone (DESYREL) 150 MG tablet Take 2 tablets (300 mg total) by mouth at bedtime.  60 tablet  0  . traZODone (DESYREL) 150 MG tablet Take 2 tablets (300 mg total) by mouth at bedtime.  60 tablet  5  . TraZODone HCl 150 MG TB24 Take 1 tablet (150 mg total) by mouth at bedtime as needed (sleep).  30 tablet  1    Exam:  BP 125/85  Pulse 78  Temp(Src) 98.6 F (37 C) (Oral)  Resp 20  SpO2 98% Gen: Well NAD Back: Nontender to  spinal midline. Tender palpation left SI joint. Negative to leg raise test and [facility] test bilaterally. Lower extremity strength is intact bilaterally. Sensation is intact bilaterally as is pulses. Normal gait  No results found for this or any previous visit (from the past 24 hour(s)). No results found.  Assessment and Plan: 58 y.o. male with chronic low back pain. Refill one month supply of Norco. 30 tablets.  Consultation with financial counselor/social work. Patient should be eligible for Medicaid additionally. Recommend he followup with her primary care provider affiliated with [facility].   Discussed warning signs or symptoms. Please see discharge instructions. Patient expresses understanding.    Gregor Hams, MD 08/06/13 [phone]

## 2013-08-06 NOTE — Discharge Instructions (Signed)
Thank you for coming in today. Take norco at night for pain after work as needed.  Come back or go to the emergency room if you notice new weakness new numbness problems walking or bowel or bladder problems. Try to follow up with a new doctor.  You should be able to establish with a new doctor.   PRIMARY CARE Paramedic at [place], [place] [phone]  Fax [phone]  Therapist, music at [facility] [address] [place], [place] [phone]  Fax [phone]  Therapist, music at [place] / [place] [phone] [address] [place], [place] [phone]  Fax [phone]  [facility] at [facility] [address] [place]  [place], [place] Ph [phone]  Fax [phone]  [place] [address] [place] [address] [place], [place] Ph [phone]  Fax [phone]  [facility] at [facility] [place], [place] Ph [phone]  Fax [phone]   [place] @ [place] [address] Phone: [phone]   [place] @ [facility] [place]. [place] [address] Phone: [place] @ [place] [address] [place] [address] Phone: [place] @ [place]. [place] [address] Phone: [place] @ [place]. [place] [employer] [place], [place] [address] Phone: [phone]   [place] @ [place] [address] [address] Phone: [phone]     Chronic Back Pain  When back pain lasts longer than 3 months, it is called chronic back pain.People with chronic back pain often go through certain periods that are more intense  (flare-ups).  CAUSES Chronic back pain can be caused by wear and tear (degeneration) on different structures in your back. These structures include:  The bones of your spine (vertebrae) and the joints surrounding your spinal cord and nerve roots (facets).  The strong, fibrous tissues that connect your vertebrae (ligaments). Degeneration of these structures may result in pressure on your nerves. This can lead to constant pain. HOME CARE INSTRUCTIONS  Avoid bending, heavy lifting, prolonged sitting, and activities which make the problem worse.  Take brief periods of rest throughout the day to reduce your pain. Lying down or standing usually is better than sitting while you are resting.  Take over-the-counter or prescription medicines only as directed by your caregiver. SEEK IMMEDIATE MEDICAL CARE IF:   You have weakness or numbness in one of your legs or feet.  You have trouble controlling your bladder or bowels.  You have nausea, vomiting, abdominal pain, shortness of breath, or fainting. Document Released: 04/08/2004 Document Revised: 05/24/2011 Document Reviewed: 02/13/2011 [facility] Patient Information 2014 [place], [place].

## 2013-08-08 ENCOUNTER — Ambulatory Visit (HOSPITAL_BASED_OUTPATIENT_CLINIC_OR_DEPARTMENT_OTHER): Payer: Medicare Other

## 2013-08-29 ENCOUNTER — Emergency Department (INDEPENDENT_AMBULATORY_CARE_PROVIDER_SITE_OTHER)
Admission: EM | Admit: 2013-08-29 | Discharge: 2013-08-29 | Disposition: A | Discharge status: Home / Self Care | Payer: Medicare Other | Source: Home / Self Care | Attending: Family Medicine | Admitting: Family Medicine

## 2013-08-29 ENCOUNTER — Encounter (HOSPITAL_COMMUNITY): Payer: Self-pay | Admitting: Emergency Medicine

## 2013-08-29 DIAGNOSIS — I10 Essential (primary) hypertension: Secondary | ICD-10-CM

## 2013-08-29 LAB — POCT I-STAT, CHEM 8
BUN: 8 mg/dL (ref 6–23)
CALCIUM ION: 1.31 mmol/L — AB (ref 1.12–1.23)
CHLORIDE: 98 meq/L (ref 96–112)
Creatinine, Ser: 1.3 mg/dL (ref 0.50–1.35)
GLUCOSE: 94 mg/dL (ref 70–99)
HEMATOCRIT: 48 % (ref 39.0–52.0)
HEMOGLOBIN: 16.3 g/dL (ref 13.0–17.0)
POTASSIUM: 3.6 meq/L — AB (ref 3.7–5.3)
Sodium: 145 mEq/L (ref 137–147)
TCO2: 31 mmol/L (ref 0–100)

## 2013-08-29 MED ORDER — HYDROCHLOROTHIAZIDE 25 MG PO TABS: 25.0000 mg | ORAL_TABLET | Freq: Every day | ORAL | Status: DC

## 2013-08-29 NOTE — ED Provider Notes (Signed)
Medical screening examination/treatment/procedure(s) were performed by a resident physician or non-physician practitioner and as the supervising physician I was immediately available for consultation/collaboration.  Lynne Leader, MD    Gregor Hams, MD 08/29/13 2006

## 2013-08-29 NOTE — ED Provider Notes (Signed)
CSN: [id number]     Arrival date & time 08/29/13  3500 History   First MD Initiated Contact with Patient 08/29/13 1001     Chief Complaint  Patient presents with  . Medication Refill   (Consider location/radiation/quality/duration/timing/severity/associated sxs/prior Treatment) HPI Comments: Presents requesting HCTZ refill. States he cannot be seen by his PCP (Dr. Berdine Addison) because he owes the office money. States he took the last dose of his HCTZ this morning.   The history is provided by the patient.    Past Medical History  Diagnosis Date  . Knee pain   . Hypertension   . Arthritis   . Chronic low back pain     Is supposed to have surgery soon at L4-5   Past Surgical History  Procedure Laterality Date  . Knee surgery  08    rt arthroscopy  . 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 Joint replacement      knee 05/2011   History reviewed. No pertinent family history. History  Substance Use Topics  . Smoking status: Former Smoker -- 0.50 packs/day    Quit date: 04/27/1986  . Smokeless tobacco: Not on file  . Alcohol Use: No    Review of Systems  All other systems reviewed and are negative.   Allergies  Review of patient's allergies indicates no known allergies.  Home Medications   Prior to Admission medications   Medication Sig Start Date End Date Taking? Authorizing Novalie Leamy  hydrochlorothiazide (HYDRODIURIL) 25 MG tablet Take 25 mg by mouth daily.    Yes Historical Adrean Heitz, MD  amLODipine (NORVASC) 5 MG tablet Take 1 tablet (5 mg total) by mouth daily. 02/01/13   Billy Fischer, MD  cloNIDine (CATAPRES) 0.1 MG tablet Take 0.1 mg by mouth 2 (two) times daily.     Historical Osualdo Hansell, MD  cyclobenzaprine (FLEXERIL) 5 MG tablet Take 1 tablet (5 mg total) by mouth 3 (three) times daily as needed for  muscle spasms. 10/15/12   Harden Mo, MD  hydrochlorothiazide (HYDRODIURIL) 25 MG tablet Take 1 tablet (25 mg total) by mouth daily. 08/29/13   Lahoma Rocker, PA  HYDROcodone-acetaminophen (NORCO/VICODIN) 5-325 MG per tablet 1 to 2 tabs every 4 to 6 hours as needed for pain. 07/13/13   Harden Mo, MD  HYDROcodone-acetaminophen (NORCO/VICODIN) 5-325 MG per tablet Take 1 tablet by mouth at bedtime as needed. 08/06/13   Gregor Hams, MD  lisinopril-hydrochlorothiazide (PRINZIDE,ZESTORETIC) 10-12.5 MG per tablet Take 1 tablet by mouth daily. 02/01/13   Billy Fischer, MD  naphazoline-pheniramine (NAPHCON-A) 0.025-0.3 % ophthalmic solution Place 1 drop into both eyes daily as needed. For red eyes.    Historical Shed Nixon, MD  Phenylephrine-Chlorphen-DM 12-17-10.5 MG/5ML LIQD Take 5 mLs by mouth every 4 (four) hours as needed. 12/20/12   Janne Napoleon, NP  potassium chloride (K-DUR) 10 MEQ tablet Take 10 mEq by mouth daily.     Historical Hessie Varone, MD  PredniSONE 10 MG KIT 12 day dose pack po 01/11/13   Gregor Hams, MD  ranitidine (ZANTAC) 150 MG tablet Take 1 tablet (150 mg total) by mouth 2 (two) times daily. 07/13/13   Harden Mo, MD  rosuvastatin (CRESTOR) 10 MG tablet Take 10 mg by mouth daily.    Historical Jentry Warnell, MD  sildenafil (VIAGRA) 100 MG  tablet Take 1 tablet (100 mg total) by mouth daily as needed for erectile dysfunction. 10/12/12   Harden Mo, MD  traZODone (DESYREL) 150 MG tablet Take 150 mg by mouth at bedtime.    Historical Aleynah Rocchio, MD  traZODone (DESYREL) 150 MG tablet Take 2 tablets (300 mg total) by mouth at bedtime. 06/11/13   Liam Graham, PA-C  traZODone (DESYREL) 150 MG tablet Take 2 tablets (300 mg total) by mouth at bedtime. 07/13/13   Harden Mo, MD  TraZODone HCl 150 MG TB24 Take 1 tablet (150 mg total) by mouth at bedtime as needed (sleep). 02/12/13   Fransico Meadow, PA-C   BP 127/88  Pulse 87  Temp(Src) 98.5 F (36.9 C) (Oral)  Resp 12  SpO2 98% Physical  Exam  Nursing note and vitals reviewed. Constitutional: He is oriented to person, place, and time. He appears well-developed and well-nourished. No distress.  HENT:  Head: Normocephalic and atraumatic.  Eyes: Conjunctivae are normal. No scleral icterus.  Cardiovascular: Normal rate, regular rhythm and normal heart sounds.   Pulmonary/Chest: Effort normal and breath sounds normal.  Musculoskeletal: Normal range of motion. He exhibits no edema and no tenderness.  Neurological: He is alert and oriented to person, place, and time.  Skin: Skin is warm and dry. No rash noted. No erythema.  Psychiatric: He has a normal mood and affect. His behavior is normal.    ED Course  Procedures (including critical care time) Labs Review Labs Reviewed  POCT I-STAT, CHEM 8 - Abnormal; Notable for the following:    Potassium 3.6 (*)    Calcium, Ion 1.31 (*)    All other components within normal limits    Imaging Review No results found.   MDM   1. HTN (hypertension)   Rx for 30 day supply provided to patient. Patient seen by clinic social worker and PCP appointment established for 09/19/2013 for follow up. Mild hypokalemia on Istat 8. Advised the addition of daily multivitamin for treatment.      [place], [place] 08/29/13 1323

## 2013-08-29 NOTE — ED Notes (Addendum)
Asking for refill of BP medication. States he used his last dose today, and does not want to run out

## 2013-08-29 NOTE — Discharge Instructions (Signed)
Your potassium was slightly low on your blood work today. I would suggest that you begin taking a daily multivitamin. Medication as directed and please complete the Medicaid process so you can return to seeing your primary care physician.   DASH Diet The DASH diet stands for "Dietary Approaches to Stop Hypertension." It is a healthy eating plan that has been shown to reduce high blood pressure (hypertension) in as little as 14 days, while also possibly providing other significant health benefits. These other health benefits include reducing the risk of breast cancer after menopause and reducing the risk of type 2 diabetes, heart disease, colon cancer, and stroke. Health benefits also include weight loss and slowing kidney failure in patients with chronic kidney disease.  DIET GUIDELINES  Limit salt (sodium). Your diet should contain less than 1500 mg of sodium daily.  Limit refined or processed carbohydrates. Your diet should include mostly whole grains. Desserts and added sugars should be used sparingly.  Include small amounts of heart-healthy fats. These types of fats include nuts, oils, and tub margarine. Limit saturated and trans fats. These fats have been shown to be harmful in the body. CHOOSING FOODS  The following food groups are based on a 2000 calorie diet. See your Registered Dietitian for individual calorie needs. Grains and Grain Products (6 to 8 servings daily)  Eat More Often: Whole-wheat bread, brown rice, whole-grain or wheat pasta, quinoa, popcorn without added fat or salt (air popped).  Eat Less Often: White bread, white pasta, white rice, cornbread. Vegetables (4 to 5 servings daily)  Eat More Often: Fresh, frozen, and canned vegetables. Vegetables may be raw, steamed, roasted, or grilled with a minimal amount of fat.  Eat Less Often/Avoid: Creamed or fried vegetables. Vegetables in a cheese sauce. Fruit (4 to 5 servings daily)  Eat More Often: All fresh, canned (in  natural juice), or frozen fruits. Dried fruits without added sugar. One hundred percent fruit juice ( cup [237 mL] daily).  Eat Less Often: Dried fruits with added sugar. Canned fruit in light or heavy syrup. [employer], Fish, and Poultry (2 servings or less daily. One serving is 3 to 4 oz [85-114 g]).  Eat More Often: Ninety percent or leaner ground beef, tenderloin, sirloin. Round cuts of beef, chicken breast, [place] breast. All fish. Grill, bake, or broil your meat. Nothing should be fried.  Eat Less Often/Avoid: Fatty cuts of meat, [place], or chicken leg, thigh, or wing. Fried cuts of meat or fish. Dairy (2 to 3 servings)  Eat More Often: Low-fat or fat-free milk, low-fat plain or light yogurt, reduced-fat or part-skim cheese.  Eat Less Often/Avoid: Milk (whole, 2%).Whole milk yogurt. Full-fat cheeses. Nuts, Seeds, and Legumes (4 to 5 servings per week)  Eat More Often: All without added salt.  Eat Less Often/Avoid: Salted nuts and seeds, canned beans with added salt. Fats and Sweets (limited)  Eat More Often: Vegetable oils, tub margarines without trans fats, sugar-free gelatin. Mayonnaise and salad dressings.  Eat Less Often/Avoid: Coconut oils, palm oils, butter, stick margarine, cream, half and half, cookies, candy, pie. FOR MORE INFORMATION The Dash Diet Eating Plan: www.dashdiet.org Document Released: 02/18/2011 Document Revised: 05/24/2011 Document Reviewed: 02/18/2011 [facility] Patient Information 2014 [place], [place].  Hypertension Hypertension, commonly called high blood pressure, is when the force of blood pumping through your arteries is too strong. Your arteries are the blood vessels that carry blood from your heart throughout your body. A blood pressure reading consists of a higher number over a  lower number, such as 110/72. The higher number (systolic) is the pressure inside your arteries when your heart pumps. The lower number (diastolic) is the pressure inside your  arteries when your heart relaxes. Ideally you want your blood pressure below 120/80. Hypertension forces your heart to work harder to pump blood. Your arteries may become narrow or stiff. Having hypertension puts you at risk for heart disease, stroke, and other problems.  RISK FACTORS Some risk factors for high blood pressure are controllable. Others are not.  Risk factors you cannot control include:   Race. You may be at higher risk if you are African American.  Age. Risk increases with age.  Gender. Men are at higher risk than women before age 23 years. After age 35, women are at higher risk than men. Risk factors you can control include:  Not getting enough exercise or physical activity.  Being overweight.  Getting too much fat, sugar, calories, or salt in your diet.  Drinking too much alcohol. SIGNS AND SYMPTOMS Hypertension does not usually cause signs or symptoms. Extremely high blood pressure (hypertensive crisis) may cause headache, anxiety, shortness of breath, and nosebleed. DIAGNOSIS  To check if you have hypertension, your health care provider will measure your blood pressure while you are seated, with your arm held at the level of your heart. It should be measured at least twice using the same arm. Certain conditions can cause a difference in blood pressure between your right and left arms. A blood pressure reading that is higher than normal on one occasion does not mean that you need treatment. If one blood pressure reading is high, ask your health care provider about having it checked again. TREATMENT  Treating high blood pressure includes making lifestyle changes and possibly taking medication. Living a healthy lifestyle can help lower high blood pressure. You may need to change some of your habits. Lifestyle changes may include:  Following the DASH diet. This diet is high in fruits, vegetables, and whole grains. It is low in salt, red meat, and added sugars.  Getting at  least 2 1/2 hours of brisk physical activity every week.  Losing weight if necessary.  Not smoking.  Limiting alcoholic beverages.  Learning ways to reduce stress. If lifestyle changes are not enough to get your blood pressure under control, your health care provider may prescribe medicine. You may need to take more than one. Work closely with your health care provider to understand the risks and benefits. HOME CARE INSTRUCTIONS  Have your blood pressure rechecked as directed by your health care provider.   Only take medicine as directed by your health care provider. Follow the directions carefully. Blood pressure medicines must be taken as prescribed. The medicine does not work as well when you skip doses. Skipping doses also puts you at risk for problems.   Do not smoke.   Monitor your blood pressure at home as directed by your health care provider. SEEK MEDICAL CARE IF:   You think you are having a reaction to medicines taken.  You have recurrent headaches or feel dizzy.  You have swelling in your ankles.  You have trouble with your vision. SEEK IMMEDIATE MEDICAL CARE IF:  You develop a severe headache or confusion.  You have unusual weakness, numbness, or feel faint.  You have severe chest or abdominal pain.  You vomit repeatedly.  You have trouble breathing. MAKE SURE YOU:   Understand these instructions.  Will watch your condition.  Will get help right  away if you are not doing well or get worse. Document Released: 03/01/2005 Document Revised: 03/06/2013 Document Reviewed: 12/22/2012 [facility] Patient Information 2015 [place], [place]. This information is not intended to replace advice given to you by your health care provider. Make sure you discuss any questions you have with your health care provider.

## 2013-09-10 ENCOUNTER — Emergency Department (INDEPENDENT_AMBULATORY_CARE_PROVIDER_SITE_OTHER)
Admission: EM | Admit: 2013-09-10 | Discharge: 2013-09-10 | Disposition: A | Discharge status: Home / Self Care | Payer: Medicare Other | Source: Home / Self Care | Attending: Emergency Medicine | Admitting: Emergency Medicine

## 2013-09-10 ENCOUNTER — Encounter (HOSPITAL_COMMUNITY): Payer: Self-pay | Admitting: Emergency Medicine

## 2013-09-10 DIAGNOSIS — G4700 Insomnia, unspecified: Secondary | ICD-10-CM

## 2013-09-10 DIAGNOSIS — I10 Essential (primary) hypertension: Secondary | ICD-10-CM

## 2013-09-10 MED ORDER — TRAZODONE HCL 150 MG PO TABS: 150.0000 mg | ORAL_TABLET | Freq: Every day | ORAL | Status: DC

## 2013-09-10 MED ORDER — HYDROCHLOROTHIAZIDE 25 MG PO TABS: 25.0000 mg | ORAL_TABLET | Freq: Every day | ORAL | Status: DC

## 2013-09-10 NOTE — ED Provider Notes (Signed)
Chief Complaint   Chief Complaint  Patient presents with  . Medication Refill    History of Present Illness   Eric Macdonald is a 58 year old male who is in today for refills on some meds. He needs trazodone 150 mg at bedtime. He takes this for sleep. He denies any depression or anxiety. He's had no medication side effects. Additionally he also will need a refill of his hydrochlorothiazide for his blood pressure. He was seen for this a few days ago, and was given a refill, but he won't get in to see a primary care physician for a few months, so I need a few more. He denies any medication side effects from his hydrochlorothiazide and has had no headaches, dizziness, blurry vision, chest pain, shortness of breath, ankle edema, or strokelike symptoms.  Review of Systems     Other than as noted above, the patient denies any of the following symptoms: Systemic:  No fever, chills, fatigue, myalgias, headache, or anorexia. Eye:  No redness, pain or drainage. ENT:  No earache, nasal congestion, rhinorrhea, sinus pressure, or sore throat. Lungs:  No cough, sputum production, wheezing, shortness of breath.  Cardiovascular:  No chest pain, palpitations, or syncope. GI:  No nausea, vomiting, abdominal pain or diarrhea. GU:  No dysuria, frequency, or hematuria. Skin:  No rash or pruritis.   [place]     Past medical history, family history, social history, meds, and allergies were reviewed.    Physical Examination    Vital signs:  BP 135/90  Pulse 74  Temp(Src) 98.4 F (36.9 C) (Oral)  Resp 18  SpO2 98% General:  Alert, in no distress. Eye:  PERRL, full EOMs.  Lids and conjunctivas were normal. ENT:  TMs and canals were normal, without erythema or inflammation.  Nasal mucosa was clear and uncongested, without drainage.  Mucous membranes were moist.  Pharynx was clear, without exudate or drainage.  There were no oral ulcerations or lesions. Neck:  Supple, no adenopathy, tenderness or mass.  Thyroid was normal. Lungs:  No respiratory distress.  Lungs were clear to auscultation, without wheezes, rales or rhonchi.  Breath sounds were clear and equal bilaterally. Heart:  Regular rhythm, without gallops, murmers or rubs. Abdomen:  Soft, flat, and non-tender to palpation.  No hepatosplenomagaly or mass. Skin:  Clear, warm, and dry, without rash or lesions.  Labs    Results for orders placed during the hospital encounter of 08/29/13  POCT I-STAT, CHEM 8      Result Value Ref Range   Sodium 145  137 - 147 mEq/L   Potassium 3.6 (*) 3.7 - 5.3 mEq/L   Chloride 98  96 - 112 mEq/L   BUN 8  6 - 23 mg/dL   Creatinine, Ser 1.30  0.50 - 1.35 mg/dL   Glucose, Bld 94  70 - 99 mg/dL   Calcium, Ion 1.31 (*) 1.12 - 1.23 mmol/L   TCO2 31  0 - 100 mmol/L   Hemoglobin 16.3  13.0 - 17.0 g/dL   HCT 48.0  39.0 - 52.0 %    Assessment   The primary encounter diagnosis was Insomnia. A diagnosis of Essential hypertension was also pertinent to this visit.  Plan     1.  Meds:  The following meds were prescribed:   Discharge Medication List as of 09/10/2013  9:38 AM    START taking these medications   Details  !! hydrochlorothiazide (HYDRODIURIL) 25 MG tablet Take 1 tablet (25 mg total) by mouth  daily., Starting 09/10/2013, Until Discontinued, Print    !! traZODone (DESYREL) 150 MG tablet Take 1 tablet (150 mg total) by mouth at bedtime., Starting 09/10/2013, Until Discontinued, Normal     !! - Potential duplicate medications found. Please discuss with provider.      2.  Patient Education/Counseling:  The patient was given appropriate handouts, self care instructions, and instructed in symptomatic relief.    3.  Follow up:  The patient was told to follow up here if no better in 3 to 4 days, or sooner if becoming worse in any way, and given some red flag symptoms such as shortness of breath or chest pain which would prompt immediate return.  Follow up with community health and wellness clinic as  soon as possible.        Harden Mo, MD 09/10/13 [phone]

## 2013-09-10 NOTE — Discharge Instructions (Signed)
°Insomnia °Insomnia is frequent trouble falling and/or staying asleep. Insomnia can be a long term problem or a short term problem. Both are common. Insomnia can be a short term problem when the wakefulness is related to a certain stress or worry. Long term insomnia is often related to ongoing stress during waking hours and/or poor sleeping habits. Overtime, sleep deprivation itself can make the problem worse. Every little thing feels more severe because you are overtired and your ability to cope is decreased. °CAUSES  °· Stress, anxiety, and depression. °· Poor sleeping habits. °· Distractions such as TV in the bedroom. °· Naps close to bedtime. °· Engaging in emotionally charged conversations before bed. °· Technical reading before sleep. °· Alcohol and other sedatives. They may make the problem worse. They can hurt normal sleep patterns and normal dream activity. °· Stimulants such as caffeine for several hours prior to bedtime. °· Pain syndromes and shortness of breath can cause insomnia. °· Exercise late at night. °· Changing time zones may cause sleeping problems (jet lag). °It is sometimes helpful to have someone observe your sleeping patterns. They should look for periods of not breathing during the night (sleep apnea). They should also look to see how long those periods last. If you live alone or observers are uncertain, you can also be observed at a sleep clinic where your sleep patterns will be professionally monitored. Sleep apnea requires a checkup and treatment. Give your caregivers your medical history. Give your caregivers observations your family has made about your sleep.  °SYMPTOMS  °· Not feeling rested in the morning. °· Anxiety and restlessness at bedtime. °· Difficulty falling and staying asleep. °TREATMENT  °· Your caregiver may prescribe treatment for an underlying medical disorders. Your caregiver can give advice or help if you are using alcohol or other drugs for self-medication.  Treatment of underlying problems will usually eliminate insomnia problems. °· Medications can be prescribed for short time use. They are generally not recommended for lengthy use. °· Over-the-counter sleep medicines are not recommended for lengthy use. They can be habit forming. °· You can promote easier sleeping by making lifestyle changes such as: °¨ Using relaxation techniques that help with breathing and reduce muscle tension. °¨ Exercising earlier in the day. °¨ Changing your diet and the time of your last meal. No night time snacks. °¨ Establish a regular time to go to bed. °· Counseling can help with stressful problems and worry. °· Soothing music and white noise may be helpful if there are background noises you cannot remove. °· Stop tedious detailed work at least one hour before bedtime. °HOME CARE INSTRUCTIONS  °· Keep a diary. Inform your caregiver about your progress. This includes any medication side effects. See your caregiver regularly. Take note of: °¨ Times when you are asleep. °¨ Times when you are awake during the night. °¨ The quality of your sleep. °¨ How you feel the next day. °This information will help your caregiver care for you. °· Get out of bed if you are still awake after 15 minutes. Read or do some quiet activity. Keep the lights down. Wait until you feel sleepy and go back to bed. °· Keep regular sleeping and waking hours. Avoid naps. °· Exercise regularly. °· Avoid distractions at bedtime. Distractions include watching television or engaging in any intense or detailed activity like attempting to balance the household checkbook. °· Develop a bedtime ritual. Keep a familiar routine of bathing, brushing your teeth, climbing into bed at the same   time each night, listening to soothing music. Routines increase the success of falling to sleep faster.  Use relaxation techniques. This can be using breathing and muscle tension release routines. It can also include visualizing peaceful scenes. You can  also help control troubling or intruding thoughts by keeping your mind occupied with boring or repetitive thoughts like the old concept of counting sheep. You can make it more creative like imagining planting one beautiful flower after another in your backyard garden.  During your day, work to eliminate stress. When this is not possible use some of the previous suggestions to help reduce the anxiety that accompanies stressful situations. MAKE SURE YOU:   Understand these instructions.  Will watch your condition.  Will get help right away if you are not doing well or get worse. Document Released: 02/27/2000 Document Revised: 05/24/2011 Document Reviewed: 03/29/2007 [facility] Patient Information 2015 [place], [place]. This information is not intended to replace advice given to you by your health care provider. Make sure you discuss any questions you have with your health care provider.  Blood pressure over the ideal can put you at higher risk for stroke, heart disease, and kidney failure.  For this reason, it's important to try to get your blood pressure as close as possible to the ideal.  The ideal blood pressure is 120/80.  Blood pressures from 161-096 systolic over 04-54 diastolic are labeled as "prehypertension."  This means you are at higher risk of developing hypertension in the future.  Blood pressures in this range are not treated with medication, but lifestyle changes are recommended to prevent progression to hypertension.  Blood pressures of 098 and above systolic over 90 and above diastolic are classified as hypertension and are treated with medications.  Lifestyle changes which can benefit both prehypertension and hypertension include the following:   Salt and sodium restriction.  Weight loss.  Regular exercise.  Avoidance of tobacco.  Avoidance of excess alcohol.  The "D.A.S.H" diet.   People with hypertension and prehypertension should limit their salt intake to less than  1500 mg daily.  Reading the nutrition information on the label of many prepared foods can give you an idea of how much sodium you're consuming at each meal.  Remember that the most important number on the nutrition information is the serving size.  It may be smaller than you think.  Try to avoid adding extra salt at the table.  You may add small amounts of salt while cooking.  Remember that salt is an acquired taste and you may get used to a using a whole lot less salt than you are using now.  Using less salt lets the food's natural flavors come through.  You might want to consider using salt substitutes, potassium chloride, pepper, or blends of herbs and spices to enhance the flavor of your food.  Foods that contain the most salt include: processed meats (like ham, bacon, lunch meat, sausage, hot dogs, and breakfast meat), chips, pretzels, salted nuts, soups, salty snacks, canned foods, junk food, fast food, restaurant food, mustard, pickles, pizza, popcorn, soy sauce, and worcestershire sauce--quite a list!  You might ask, "Is there anything I can eat?"  The answer is, "yes."  Fruits and vegetables are usually low in salt.  Fresh is better than frozen which is better than canned.  If you have canned vegetables, you can cut down on the salt content by rinsing them in tap water 3 times before cooking.     Weight loss is the  second thing you can do to lower your blood pressure.  Getting to and maintaining ideal weight will often normalize your blood pressure and allow you to avoid medications, entirely, cut way down on your dosage of medications, or allow to wean off your meds.  (Note, this should only be done under the supervision of your primary care doctor.)  Of course, weight loss takes time and you may need to be on medication in the meantime.  You shoot for a body mass index of 20-25.  When you go to the urgent care or to your primary care doctor, they should calculate your BMI.  If you don't know what it  is, ask.  You can calculate your BMI with the following formula:  Weight in pounds x 703/ (height in inches) x (height in inches).  There are many good diets out there: Weight Watchers and the D.A.S.H. Diet are the best, but often, just modifying a few factors can be helpful:  Don't skip meals, don't eat out, and keeping a food diary.  I do not recommend fad diets or diet pills which often raise blood pressure.    Everyone should get regular exercise, but this is particularly important for people with high blood pressure.  Just about any exercise is good.  The only exercise which may be harmful is lifting extreme heavy weights.  I recommend moderate exercise such as walking for 30 minutes 5 days a week.  Going to the gym for a 50 minute workout 3 times a week is also good.  This amounts to 150 minutes of exercise weekly.   Anyone with high blood pressure should avoid any use of tobacco.  Tobacco use does not elevate blood pressure, but it increases the risk of heart disease and stroke.  If you are interested in quitting, discuss with your doctor how to quit.  If you are not interested in quitting, ask yourself, "What would my life be like in 10 years if I continue to smoke?"  "How will I know when it is time to quit?"  "How would my life be better if I were to quit."   Excess alcohol intake can raise the blood pressure.  The safe alcohol intake is 2 drinks or less per day for men and 1 drink per day or less for women.   There is a very good diet which I recommend that has been designed for people with blood pressure called the D.A.S.H. Diet (dietary approaches to stop hypertension).  It consists of fruits, vegetables, lean meats, low fat dairy, whole grains, nuts and seeds.  It is very low in salt and sodium.  It has also been found to have other beneficial health effects such as lowering cholesterol and helping lose weight.  It has been developed by the [employer] and can be  downloaded from the internet without any cost. Just do a Development worker, community on "D.A.S.H. Diet." or go the NIH website (MasterBoxes.it).  There are also cookbooks and diet plans that can be gotten from [place] to help you with this diet.

## 2013-09-10 NOTE — ED Notes (Signed)
Out of trazodone , used last dose last PM

## 2013-09-19 ENCOUNTER — Ambulatory Visit: Payer: Medicare Other | Admitting: Family

## 2013-11-12 ENCOUNTER — Encounter (HOSPITAL_COMMUNITY): Payer: Self-pay | Admitting: Emergency Medicine

## 2013-11-12 ENCOUNTER — Emergency Department (INDEPENDENT_AMBULATORY_CARE_PROVIDER_SITE_OTHER)
Admission: EM | Admit: 2013-11-12 | Discharge: 2013-11-12 | Disposition: A | Discharge status: Home / Self Care | Payer: Medicare Other | Source: Home / Self Care | Attending: Emergency Medicine | Admitting: Emergency Medicine

## 2013-11-12 DIAGNOSIS — G4700 Insomnia, unspecified: Secondary | ICD-10-CM

## 2013-11-12 DIAGNOSIS — I10 Essential (primary) hypertension: Secondary | ICD-10-CM

## 2013-11-12 MED ORDER — TRAZODONE HCL 300 MG PO TABS
300.0000 mg | ORAL_TABLET | Freq: Every day | ORAL | Status: DC
Start: 2013-11-12 — End: 2015-03-05

## 2013-11-12 NOTE — ED Provider Notes (Signed)
CSN: [id number]     Arrival date & time 11/12/13  1120 History   First MD Initiated Contact with Patient 11/12/13 1220     Chief Complaint  Patient presents with  . Medication Refill   (Consider location/radiation/quality/duration/timing/severity/associated sxs/prior Treatment) HPI Comments: 58 year old male presents for a request of trazodone refill. He has been coming to the urgent care for over a year receiving this prescription. He has Medicare but has not attempted to find a physician for his primary care. He also has hypertension and needs to be evaluated for that as well as other chronic medical conditions. He states that he now has to take trazodone 300 mg each bedtime for sleep.   Past Medical History  Diagnosis Date  . Knee pain   . Hypertension   . Arthritis   . Chronic low back pain     Is supposed to have surgery soon at L4-5   Past Surgical History  Procedure Laterality Date  . Knee surgery  08    rt arthroscopy  . 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 Joint replacement      knee 05/2011   No family history on file. History  Substance Use Topics  . Smoking status: Former Smoker -- 0.50 packs/day    Quit date: 04/27/1986  . Smokeless tobacco: Not on file  . Alcohol Use: No    Review of Systems  All other systems reviewed and are negative.   Allergies  Review of patient's allergies indicates no known allergies.  Home Medications   Prior to Admission medications   Medication Sig Start Date End Date Taking? Authorizing Provider  amLODipine (NORVASC) 5 MG tablet Take 1 tablet (5 mg total) by mouth daily. 02/01/13   Billy Fischer, MD  cloNIDine (CATAPRES) 0.1 MG tablet Take 0.1 mg by mouth 2 (two) times daily.     Historical Provider, MD  cyclobenzaprine (FLEXERIL) 5 MG tablet Take 1 tablet  (5 mg total) by mouth 3 (three) times daily as needed for muscle spasms. 10/15/12   Harden Mo, MD  hydrochlorothiazide (HYDRODIURIL) 25 MG tablet Take 25 mg by mouth daily.     Historical Provider, MD  hydrochlorothiazide (HYDRODIURIL) 25 MG tablet Take 1 tablet (25 mg total) by mouth daily. 08/29/13   Audelia Hives Presson, PA  hydrochlorothiazide (HYDRODIURIL) 25 MG tablet Take 1 tablet (25 mg total) by mouth daily. 09/10/13   Harden Mo, MD  HYDROcodone-acetaminophen (NORCO/VICODIN) 5-325 MG per tablet 1 to 2 tabs every 4 to 6 hours as needed for pain. 07/13/13   Harden Mo, MD  HYDROcodone-acetaminophen (NORCO/VICODIN) 5-325 MG per tablet Take 1 tablet by mouth at bedtime as needed. 08/06/13   Gregor Hams, MD  lisinopril-hydrochlorothiazide (PRINZIDE,ZESTORETIC) 10-12.5 MG per tablet Take 1 tablet by mouth daily. 02/01/13   Billy Fischer, MD  naphazoline-pheniramine (NAPHCON-A) 0.025-0.3 % ophthalmic solution Place 1 drop into both eyes daily as needed. For red eyes.    Historical Provider, MD  Phenylephrine-Chlorphen-DM 12-17-10.5 MG/5ML LIQD Take 5 mLs by mouth every 4 (four) hours as needed. 12/20/12   Janne Napoleon, NP  potassium chloride (K-DUR) 10 MEQ tablet Take 10 mEq by mouth daily.     Historical Provider, MD  PredniSONE 10 MG KIT 12 day dose pack po 01/11/13  Gregor Hams, MD  ranitidine (ZANTAC) 150 MG tablet Take 1 tablet (150 mg total) by mouth 2 (two) times daily. 07/13/13   Harden Mo, MD  rosuvastatin (CRESTOR) 10 MG tablet Take 10 mg by mouth daily.    Historical Provider, MD  sildenafil (VIAGRA) 100 MG tablet Take 1 tablet (100 mg total) by mouth daily as needed for erectile dysfunction. 10/12/12   Harden Mo, MD  traZODone (DESYREL) 150 MG tablet Take 150 mg by mouth at bedtime.    Historical Provider, MD  traZODone (DESYREL) 150 MG tablet Take 2 tablets (300 mg total) by mouth at bedtime. 06/11/13   Liam Graham, PA-C  traZODone (DESYREL) 150 MG tablet Take 2  tablets (300 mg total) by mouth at bedtime. 07/13/13   Harden Mo, MD  traZODone (DESYREL) 150 MG tablet Take 1 tablet (150 mg total) by mouth at bedtime. 09/10/13   Harden Mo, MD  trazodone (DESYREL) 300 MG tablet Take 1 tablet (300 mg total) by mouth at bedtime. 11/12/13   Janne Napoleon, NP  TraZODone HCl 150 MG TB24 Take 1 tablet (150 mg total) by mouth at bedtime as needed (sleep). 02/12/13   Fransico Meadow, PA-C   BP 172/113  Pulse 76  Temp(Src) 98.6 F (37 C) (Oral)  Resp 16  SpO2 98% Physical Exam  Nursing note and vitals reviewed. Constitutional: He is oriented to person, place, and time. He appears well-developed and well-nourished. No distress.  Neck: Normal range of motion. Neck supple.  Cardiovascular: Normal rate.   Pulmonary/Chest: Effort normal. No respiratory distress.  Neurological: He is alert and oriented to person, place, and time. He exhibits normal muscle tone.  Skin: Skin is warm and dry.  Psychiatric: He has a normal mood and affect.    ED Course  Procedures (including critical care time) Labs Review Labs Reviewed - No data to display  Imaging Review No results found.   MDM   1. Insomnia   2. Essential hypertension     Must call and obtain a PCP for chronic conditions, refills, HTN management, labs  Etc and.     Janne Napoleon, NP 11/12/13 1301

## 2013-11-12 NOTE — Discharge Instructions (Signed)
Hypertension °Hypertension, commonly called high blood pressure, is when the force of blood pumping through your arteries is too strong. Your arteries are the blood vessels that carry blood from your heart throughout your body. A blood pressure reading consists of a higher number over a lower number, such as 110/72. The higher number (systolic) is the pressure inside your arteries when your heart pumps. The lower number (diastolic) is the pressure inside your arteries when your heart relaxes. Ideally you want your blood pressure below 120/80. °Hypertension forces your heart to work harder to pump blood. Your arteries may become narrow or stiff. Having hypertension puts you at risk for heart disease, stroke, and other problems.  °RISK FACTORS °Some risk factors for high blood pressure are controllable. Others are not.  °Risk factors you cannot control include:  °· Race. You may be at higher risk if you are African American. °· Age. Risk increases with age. °· Gender. Men are at higher risk than women before age 45 years. After age 65, women are at higher risk than men. °Risk factors you can control include: °· Not getting enough exercise or physical activity. °· Being overweight. °· Getting too much fat, sugar, calories, or salt in your diet. °· Drinking too much alcohol. °SIGNS AND SYMPTOMS °Hypertension does not usually cause signs or symptoms. Extremely high blood pressure (hypertensive crisis) may cause headache, anxiety, shortness of breath, and nosebleed. °DIAGNOSIS  °To check if you have hypertension, your health care provider will measure your blood pressure while you are seated, with your arm held at the level of your heart. It should be measured at least twice using the same arm. Certain conditions can cause a difference in blood pressure between your right and left arms. A blood pressure reading that is higher than normal on one occasion does not mean that you need treatment. If one blood pressure reading  is high, ask your health care provider about having it checked again. °TREATMENT  °Treating high blood pressure includes making lifestyle changes and possibly taking medicine. Living a healthy lifestyle can help lower high blood pressure. You may need to change some of your habits. °Lifestyle changes may include: °· Following the DASH diet. This diet is high in fruits, vegetables, and whole grains. It is low in salt, red meat, and added sugars. °· Getting at least 2½ hours of brisk physical activity every week. °· Losing weight if necessary. °· Not smoking. °· Limiting alcoholic beverages. °· Learning ways to reduce stress. ° If lifestyle changes are not enough to get your blood pressure under control, your health care provider may prescribe medicine. You may need to take more than one. Work closely with your health care provider to understand the risks and benefits. °HOME CARE INSTRUCTIONS °· Have your blood pressure rechecked as directed by your health care provider.   °· Take medicines only as directed by your health care provider. Follow the directions carefully. Blood pressure medicines must be taken as prescribed. The medicine does not work as well when you skip doses. Skipping doses also puts you at risk for problems.   °· Do not smoke.   °· Monitor your blood pressure at home as directed by your health care provider.  °SEEK MEDICAL CARE IF:  °· You think you are having a reaction to medicines taken. °· You have recurrent headaches or feel dizzy. °· You have swelling in your ankles. °· You have trouble with your vision. °SEEK IMMEDIATE MEDICAL CARE IF: °· You develop a severe headache or confusion. °·   You have unusual weakness, numbness, or feel faint.  You have severe chest or abdominal pain.  You vomit repeatedly.  You have trouble breathing. MAKE SURE YOU:   Understand these instructions.  Will watch your condition.  Will get help right away if you are not doing well or get worse. Document  Released: 03/01/2005 Document Revised: 07/16/2013 Document Reviewed: 12/22/2012 [facility] Patient Information 2015 [place], [place]. This information is not intended to replace advice given to you by your health care provider. Make sure you discuss any questions you have with your health care provider.  Insomnia Insomnia is frequent trouble falling and/or staying asleep. Insomnia can be a long term problem or a short term problem. Both are common. Insomnia can be a short term problem when the wakefulness is related to a certain stress or worry. Long term insomnia is often related to ongoing stress during waking hours and/or poor sleeping habits. Overtime, sleep deprivation itself can make the problem worse. Every little thing feels more severe because you are overtired and your ability to cope is decreased. CAUSES   Stress, anxiety, and depression.  Poor sleeping habits.  Distractions such as TV in the bedroom.  Naps close to bedtime.  Engaging in emotionally charged conversations before bed.  Technical reading before sleep.  Alcohol and other sedatives. They may make the problem worse. They can hurt normal sleep patterns and normal dream activity.  Stimulants such as caffeine for several hours prior to bedtime.  Pain syndromes and shortness of breath can cause insomnia.  Exercise late at night.  Changing time zones may cause sleeping problems (jet lag). It is sometimes helpful to have someone observe your sleeping patterns. They should look for periods of not breathing during the night (sleep apnea). They should also look to see how long those periods last. If you live alone or observers are uncertain, you can also be observed at a sleep clinic where your sleep patterns will be professionally monitored. Sleep apnea requires a checkup and treatment. Give your caregivers your medical history. Give your caregivers observations your family has made about your sleep.  SYMPTOMS   Not feeling  rested in the morning.  Anxiety and restlessness at bedtime.  Difficulty falling and staying asleep. TREATMENT   Your caregiver may prescribe treatment for an underlying medical disorders. Your caregiver can give advice or help if you are using alcohol or other drugs for self-medication. Treatment of underlying problems will usually eliminate insomnia problems.  Medications can be prescribed for short time use. They are generally not recommended for lengthy use.  Over-the-counter sleep medicines are not recommended for lengthy use. They can be habit forming.  You can promote easier sleeping by making lifestyle changes such as:  Using relaxation techniques that help with breathing and reduce muscle tension.  Exercising earlier in the day.  Changing your diet and the time of your last meal. No night time snacks.  Establish a regular time to go to bed.  Counseling can help with stressful problems and worry.  Soothing music and white noise may be helpful if there are background noises you cannot remove.  Stop tedious detailed work at least one hour before bedtime. HOME CARE INSTRUCTIONS   Keep a diary. Inform your caregiver about your progress. This includes any medication side effects. See your caregiver regularly. Take note of:  Times when you are asleep.  Times when you are awake during the night.  The quality of your sleep.  How you feel the next  day. This information will help your caregiver care for you.  Get out of bed if you are still awake after 15 minutes. Read or do some quiet activity. Keep the lights down. Wait until you feel sleepy and go back to bed.  Keep regular sleeping and waking hours. Avoid naps.  Exercise regularly.  Avoid distractions at bedtime. Distractions include watching television or engaging in any intense or detailed activity like attempting to balance the household checkbook.  Develop a bedtime ritual. Keep a familiar routine of bathing,  brushing your teeth, climbing into bed at the same time each night, listening to soothing music. Routines increase the success of falling to sleep faster.  Use relaxation techniques. This can be using breathing and muscle tension release routines. It can also include visualizing peaceful scenes. You can also help control troubling or intruding thoughts by keeping your mind occupied with boring or repetitive thoughts like the old concept of counting sheep. You can make it more creative like imagining planting one beautiful flower after another in your backyard garden.  During your day, work to eliminate stress. When this is not possible use some of the previous suggestions to help reduce the anxiety that accompanies stressful situations. MAKE SURE YOU:   Understand these instructions.  Will watch your condition.  Will get help right away if you are not doing well or get worse. Document Released: 02/27/2000 Document Revised: 05/24/2011 Document Reviewed: 03/29/2007 [facility] Patient Information 2015 [place], [place]. This information is not intended to replace advice given to you by your health care provider. Make sure you discuss any questions you have with your health care provider.

## 2013-11-12 NOTE — ED Notes (Signed)
Patient presents for refill of his Trazodone. Reports he has been taking two pills in order to sleep. Patient reports he took his last pill last night and only took one and could not sleep. Patient is alert and oriented and in no acute distress.

## 2013-11-12 NOTE — ED Provider Notes (Signed)
Medical screening examination/treatment/procedure(s) were performed by non-physician practitioner and as supervising physician I was immediately available for consultation/collaboration.  Philipp Deputy, M.D.  Harden Mo, MD 11/12/13 [phone]

## 2014-10-03 IMAGING — CR DG ANKLE COMPLETE 3+V*L*
3 series · 3 of 3 positions shown · non-contrast
Comparison: None.

CLINICAL DATA: Pain

EXAM:
LEFT ANKLE COMPLETE - 3+ VIEW

[view not recorded (1 of 3)]
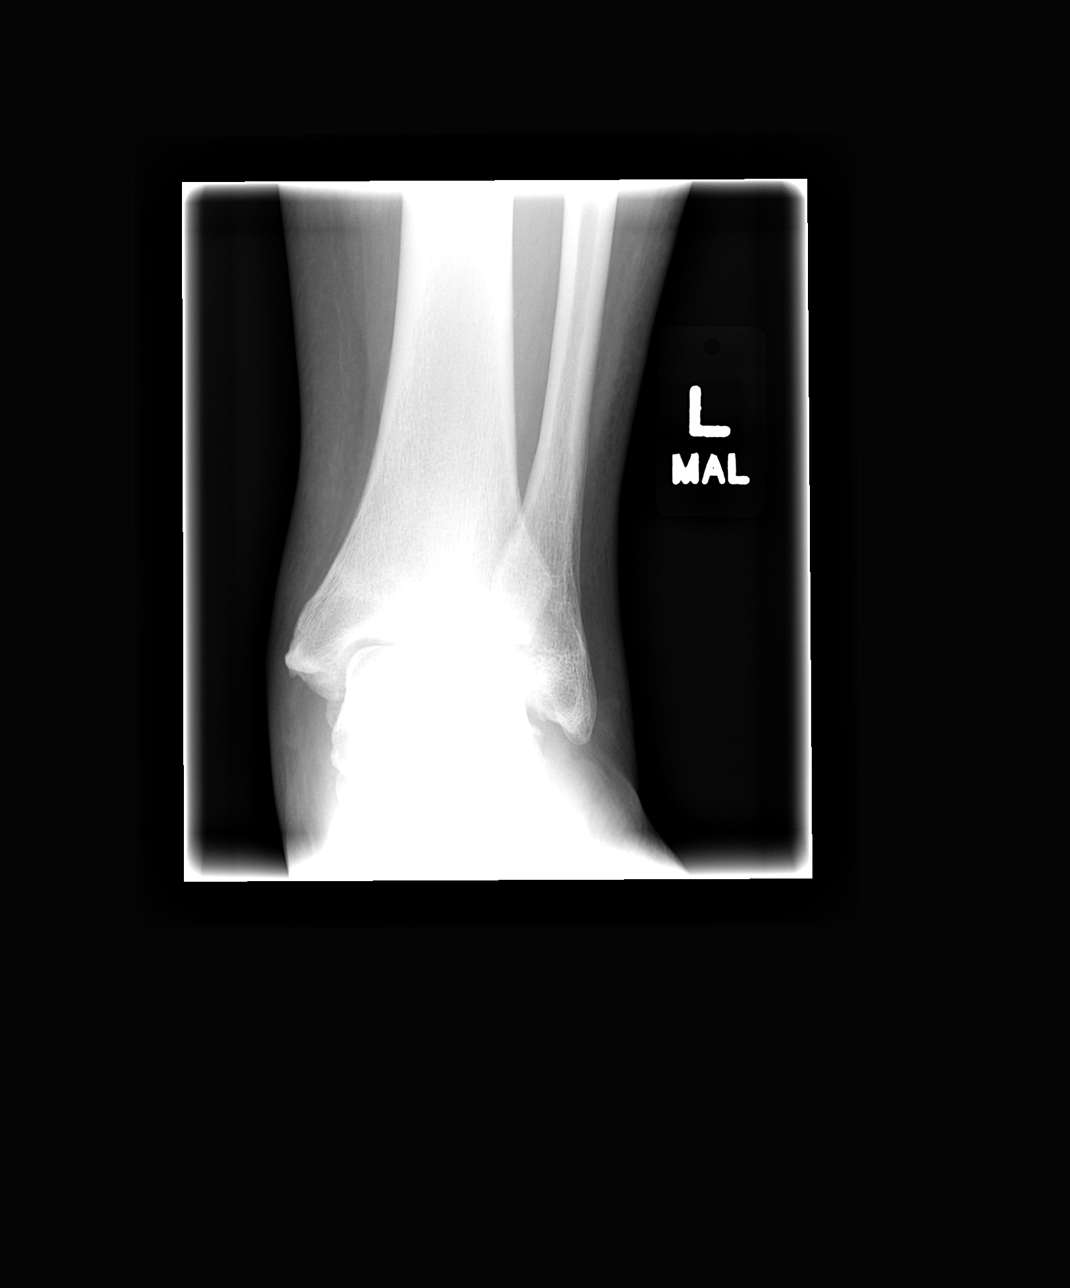

[view not recorded (2 of 3)]
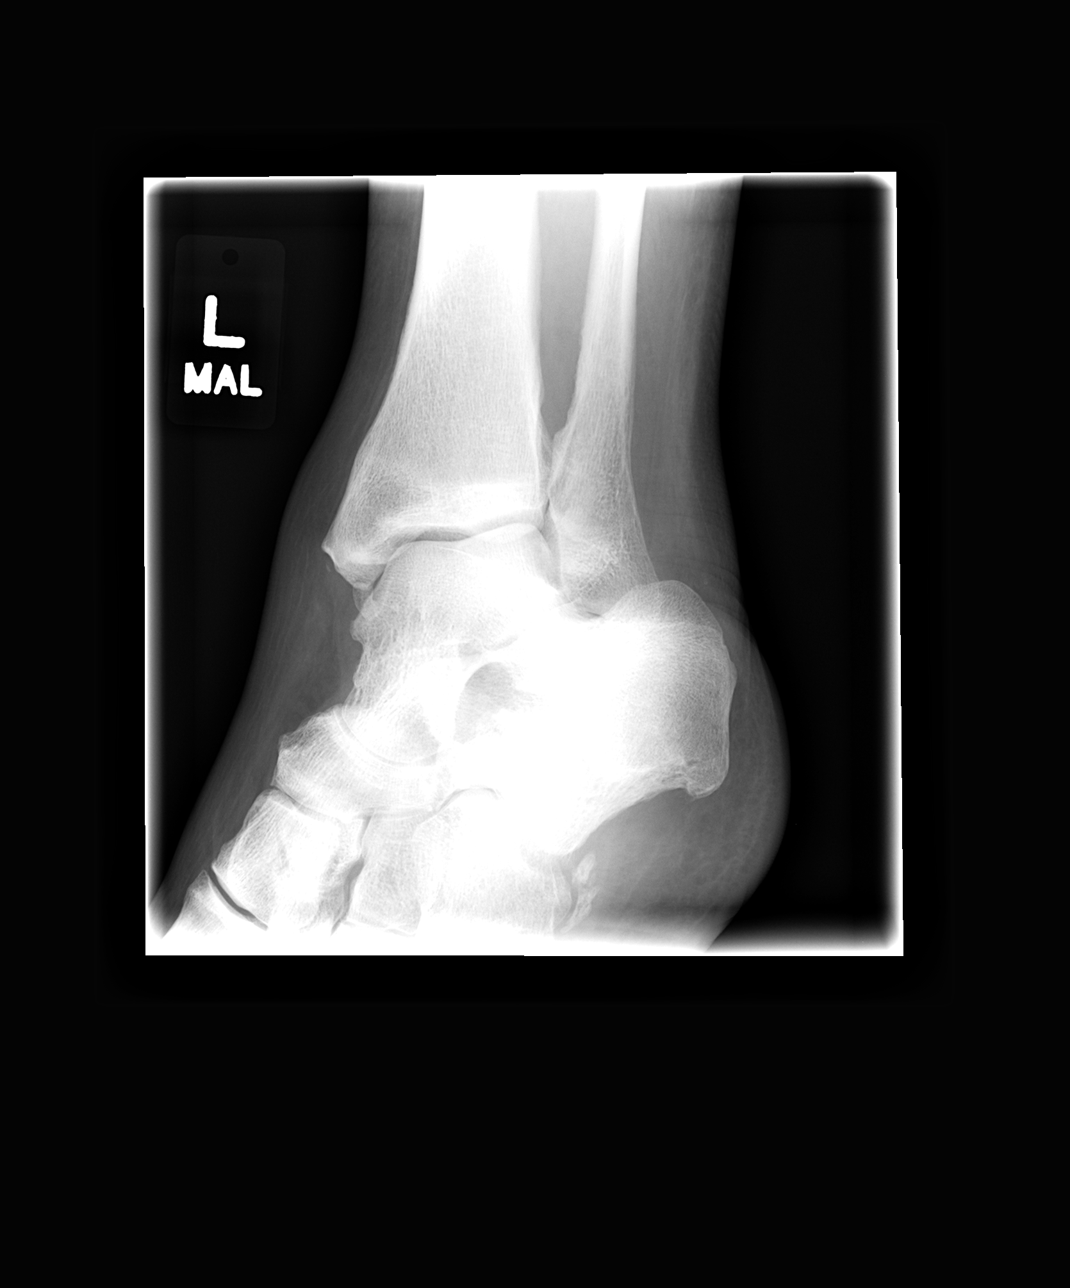

[view not recorded (3 of 3)]
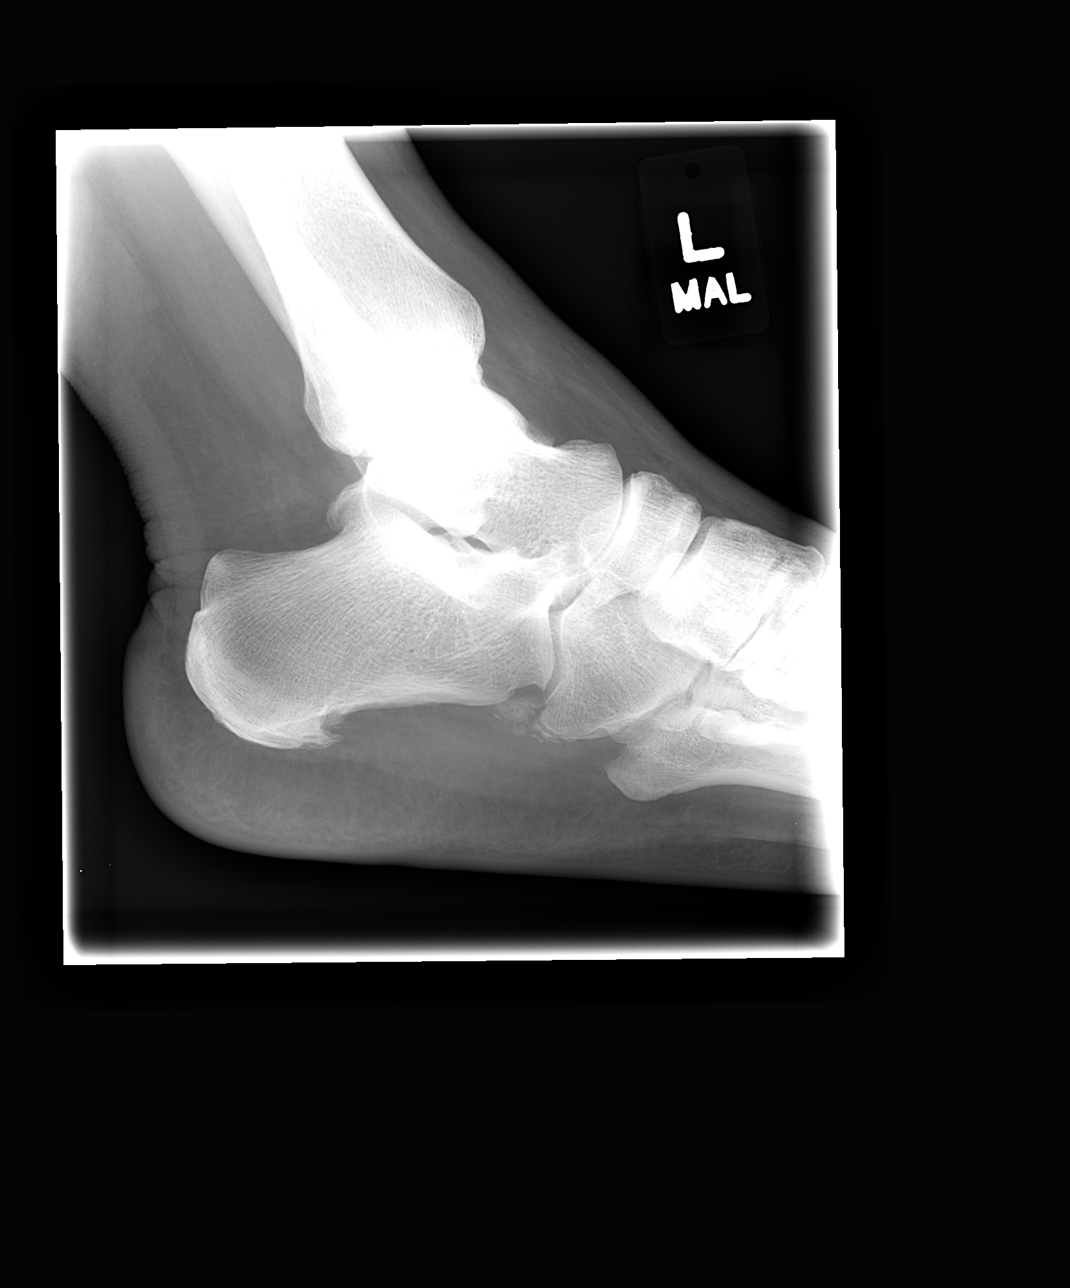

[3 of 3 positions shown; findings below may reference images not displayed]

FINDINGS: Frontal, oblique, and lateral views were obtained. There is no the
acute fracture or effusion. Ankle mortise appears intact. There is a
spur arising from the inferior calcaneus. There are calcifications
adjacent to the cuboid which could represent arthropathy or residua
of old trauma.
IMPRESSION: There are calcifications adjacent to the cuboid, possibly
representing residua of old trauma. No acute fracture or effusion.
Ankle mortise appears intact.

## 2015-03-05 ENCOUNTER — Emergency Department (INDEPENDENT_AMBULATORY_CARE_PROVIDER_SITE_OTHER)
Admission: EM | Admit: 2015-03-05 | Discharge: 2015-03-05 | Disposition: A | Discharge status: Home / Self Care | Payer: Medicare Other | Source: Home / Self Care | Attending: Emergency Medicine | Admitting: Emergency Medicine

## 2015-03-05 ENCOUNTER — Encounter (HOSPITAL_COMMUNITY): Payer: Self-pay | Admitting: Emergency Medicine

## 2015-03-05 DIAGNOSIS — I159 Secondary hypertension, unspecified: Secondary | ICD-10-CM

## 2015-03-05 LAB — POCT I-STAT, CHEM 8
BUN: 16 mg/dL (ref 6–20)
CALCIUM ION: 1.14 mmol/L (ref 1.13–1.30)
CREATININE: 1.5 mg/dL — AB (ref 0.61–1.24)
Chloride: 102 mmol/L (ref 101–111)
Glucose, Bld: 98 mg/dL (ref 65–99)
HEMATOCRIT: 48 % (ref 39.0–52.0)
HEMOGLOBIN: 16.3 g/dL (ref 13.0–17.0)
Potassium: 3.6 mmol/L (ref 3.5–5.1)
Sodium: 140 mmol/L (ref 135–145)
TCO2: 31 mmol/L (ref 0–100)

## 2015-03-05 LAB — POCT URINALYSIS DIP (DEVICE)
Bilirubin Urine: NEGATIVE
Glucose, UA: NEGATIVE mg/dL
Ketones, ur: NEGATIVE mg/dL
Leukocytes, UA: NEGATIVE
NITRITE: NEGATIVE
PH: 6 (ref 5.0–8.0)
PROTEIN: 100 mg/dL — AB
Specific Gravity, Urine: >=1.03 (ref 1.005–1.030)
UROBILINOGEN UA: 0.2 mg/dL (ref 0.0–1.0)

## 2015-03-05 MED ORDER — LISINOPRIL 10 MG PO TABS: 10.0000 mg | ORAL_TABLET | Freq: Every day | ORAL | Status: DC

## 2015-03-05 NOTE — ED Notes (Signed)
Here with c/o dizziness, fatigue, headache and bilat foot swelling after HTN crisis Checked BP at home 208/134 this am Admits to stop taking BP meds 4 months ago due to family issues  Denies chest pain and sob BP 195/ 112

## 2015-03-05 NOTE — Discharge Instructions (Signed)
Is important for you to find a primary care physician to have this monitored. Call [facility], she may be able to help you Find a primary care physician. Decrease your salt intake. diet and exercise will lower your blood pressure significantly. It is important to keep your blood pressure under good control, as having a elevated for prolonged periods of time significantly increases your risk of stroke, heart attacks, kidney damage, eye damage, and other problems. Return here in a week for blood pressure recheck if you're able to find a primary care physician by then. Return immediately to the ER if you start having chest pain, headache, problems seeing, problems talking, problems walking, if you feel like you're about to pass out, if you do pass out, if you have a seizure, or for any other concerns..  This practice is taking new patients. They will see you even if you do not have insurance.  [facility] [address] [place], [place]  [address] [phone]  Go to www.goodrx.com to look up your medications. This will give you a list of where you can find your prescriptions at the most affordable prices.   If you have no primary doctor, here are some resources that may be helpful:  [place] Providers: - [facility]- [address]  [phone];   - [place] [address], [place] [phone]  - [place], [address] [phone] - [place]  [phone]  - Lucianne Lei- [address] [phone]  Only accepts [place] patients       after they have her name applied to their card  -Dr. Benito Mccreedy, [facility]. [place]    [place], [place] [address]  [phone]  Self Pay (no insurance) in [place]: - Sickle Cell Patients: Dr  Kevan Ny, [facility] [place]  - Physician Referral Service- [facility]- [address], [address], [place], [phone];  Monday to Friday, 9 a.m. - 7 p.m.; Saturday 9 a.m. to 1 p.m.  [facility]- [address] [place], [place]      [facility]- [address] [phone]  -[facility], [place]., [place]; [id number]; or [address], [place]; [phone].   [employer] [facility] [place], [place] [address], [place]; [id number]; Monday to Wednesday, 8:30 a.m. - 5 p.m.; Thursday, 8:30 a.m. - 8 p.m.  [facility], [place], [place]; [id number]; Monday to Friday, 8 a.m. - 4:30 p.m.   [facility], [place] [address] [place], [place]; first and third Saturday of the month, 9:30 a.m. - 12:30 p.m.  [facility], [address] [place], [place]; second Saturday of the month, 9 a.m. -noon.  [place] for children. For information, call [phone]; [phone]; or [phone].  Other agencies that provide inexpensive medical care:     [facility]  [facility]  [phone]    [facility]  [phone] [place]    [facility]  [phone]  [place]  [phone], [phone]; or [phone].  Chronic Pain Problems Contact [facility]  [phone] Patients need to be referred by their primary care doctor.  [facility] [facility] [address] [place]   [phone] (After Hours)  General Information: Finding a doctor  when you do not have health insurance can be tricky. Although you are not limited by an insurance plan, you are of course limited by her finances and how much but he can pay out of pocket.  What are your options if you don't have health insurance?   1) Find a Tax adviser and Pay Out of Pocket Although you won't have to find out who is covered by your insurance plan, it is a good idea to ask around and get recommendations. You will then need to call the office and see if the doctor you have chosen will accept you as a new patient and what types of options they offer for patients who are self-pay. Some doctors offer discounts or will set up payment plans for their patients who do not have insurance, but you will need to ask so you aren't surprised when you get to your appointment.  2) Contact Your Local Health Department Not all health departments have doctors that can see patients for sick visits, but many do, so it is worth a call to see if yours does. If you don't know where your local health department is, you can check in your phone book. The CDC also has a tool to help you locate your state's health department, and many state websites also have listings of all of their local health departments.  3) Find a [facility] If your illness is not likely to be very severe or complicated, you may want to try a walk in clinic. These are popping up all over the country in pharmacies, drugstores, and shopping centers. They're usually staffed by nurse practitioners or physician assistants that have been trained to treat common illnesses and complaints. They're usually fairly quick and inexpensive. However, if you have serious medical issues or chronic medical problems, these are probably not your best option

## 2015-03-05 NOTE — ED Provider Notes (Addendum)
HPI  SUBJECTIVE:  Eric Macdonald is a 59 y.o. male who presents with hypertension. Patient states that he has had 1 month of a throbbing, constant headache located in the back and top of his head, states it is not worse today. He states that he felt lightheaded this morning with some unsteadiness and some nausea. This started approximately 8 hours ago. He states that still feels a little unsteady but denies vertigo, falling, vomiting, vision changes, dysarthria, chest pain, shortness of breath, pain tearing through to his back, weakness in his arm or leg, facial droop, abdominal pain, change in urine output, hematuria. He denies presyncope, syncope. He reports intermittent lower extremity edema for the past month and a half. he states it is worse at the end of day and gone in the morning. He reports some shortness of breath with exertion to 50 yards, but this is been unchanged for several months. Patient admits to discontinuing his hydrochlorothiazide 5 months ago due to family issues. Patient also states that he ran out of it. He states that he does not have a primary care physician. Past medical history of poorly controlled hypertension. Negative for diabetes, MI, coronary disease, renal disease, stroke. Family history negative for stroke, early MI.  Patient has been seen at this urgent care multiple times since 2014 for hypertension.  Past Medical History  Diagnosis Date  . Knee pain   . Hypertension   . Arthritis   . Chronic low back pain     Is supposed to have surgery soon at L4-5    Past Surgical History  Procedure Laterality Date  . Knee surgery  08    rt arthroscopy  . 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 Joint replacement      knee 05/2011    No family history on file.  Social History  Substance Use  Topics  . Smoking status: Former Smoker -- 0.50 packs/day    Quit date: 04/27/1986  . Smokeless tobacco: None  . Alcohol Use: No    No current facility-administered medications for this encounter.  Current outpatient prescriptions:  .  lisinopril (PRINIVIL,ZESTRIL) 10 MG tablet, Take 1 tablet (10 mg total) by mouth daily., Disp: 30 tablet, Rfl: 0 .  potassium chloride (K-DUR) 10 MEQ tablet, Take 10 mEq by mouth daily. , Disp: , Rfl:  .  ranitidine (ZANTAC) 150 MG tablet, Take 1 tablet (150 mg total) by mouth 2 (two) times daily., Disp: 60 tablet, Rfl: 5 .  rosuvastatin (CRESTOR) 10 MG tablet, Take 10 mg by mouth daily., Disp: , Rfl:  .  sildenafil (VIAGRA) 100 MG tablet, Take 1 tablet (100 mg total) by mouth daily as needed for erectile dysfunction., Disp: 10 tablet, Rfl: 12 .  traZODone (DESYREL) 150 MG tablet, Take 150 mg by mouth at bedtime., Disp: , Rfl:  .  [DISCONTINUED] amLODipine (NORVASC) 5 MG tablet, Take 1 tablet (5 mg total) by mouth daily., Disp: 30 tablet, Rfl: 1 .  [DISCONTINUED] cloNIDine (CATAPRES) 0.1 MG tablet, Take 0.1 mg by mouth 2 (two) times daily. , Disp: , Rfl:  .  [DISCONTINUED] hydrochlorothiazide (HYDRODIURIL) 25 MG tablet, Take 1 tablet (25 mg total) by mouth daily., Disp: 30 tablet, Rfl: 5 .  [DISCONTINUED] lisinopril-hydrochlorothiazide (PRINZIDE,ZESTORETIC) 10-12.5 MG per tablet, Take 1 tablet by mouth daily., Disp:  30 tablet, Rfl: 1  No Known Allergies   ROS  As noted in HPI.   Physical Exam  BP 195/112 mmHg  Pulse 100  Temp(Src) 98.9 F (37.2 C) (Oral)  Resp 18  SpO2 100%   BP Readings from Last 3 Encounters:  03/05/15 195/112  11/12/13 172/113  09/10/13 135/90    Constitutional: Well developed, well nourished, no acute distress Eyes: PERRL, EOMI, conjunctiva normal bilaterally. Funduscopic normal. HENT: Normocephalic, atraumatic,mucus membranes moist Respiratory: Clear to auscultation bilaterally, no rales, no wheezing, no  rhonchi Cardiovascular: Normal rate and rhythm, no murmurs, no gallops, no rubs. Radial pulses 2+ equal bilaterally GI: Soft, nondistended, normal bowel sounds, nontender, no rebound, no guarding Back: no CVAT skin: No rash, skin intact Musculoskeletal: 1+ edema to mid shins, calves symmetric, nontender, no tenderness, no deformities Neurologic: Alert & oriented x 3, CN II-XII  intact, no motor deficits, finger-nose, heel shin within normal limits. Tandem gait steady, Romberg negative, sensation grossly intact. Strength upper and lower extremities 5+ and equal bilaterally. Grip strength equal. Psychiatric: Speech and behavior appropriate   ED Course   Medications - No data to display  Orders Placed This Encounter  Procedures  . I-STAT, chem 8    Standing Status: Standing     Number of Occurrences: 1     Standing Expiration Date:   . POCT urinalysis dip (device)    Standing Status: Standing     Number of Occurrences: 1     Standing Expiration Date:    No results found for this or any previous visit (from the past 24 hour(s)). No results found.  Results for orders placed or performed during the hospital encounter of 03/05/15  I-STAT, chem 8  Result Value Ref Range   Sodium 140 135 - 145 mmol/L   Potassium 3.6 3.5 - 5.1 mmol/L   Chloride 102 101 - 111 mmol/L   BUN 16 6 - 20 mg/dL   Creatinine, Ser 1.50 (H) 0.61 - 1.24 mg/dL   Glucose, Bld 98 65 - 99 mg/dL   Calcium, Ion 1.14 1.13 - 1.30 mmol/L   TCO2 31 0 - 100 mmol/L   Hemoglobin 16.3 13.0 - 17.0 g/dL   HCT 48.0 39.0 - 52.0 %  POCT urinalysis dip (device)  Result Value Ref Range   Glucose, UA NEGATIVE NEGATIVE mg/dL   Bilirubin Urine NEGATIVE NEGATIVE   Ketones, ur NEGATIVE NEGATIVE mg/dL   Specific Gravity, Urine >=1.030 1.005 - 1.030   Hgb urine dipstick TRACE (A) NEGATIVE   pH 6.0 5.0 - 8.0   Protein, ur 100 (A) NEGATIVE mg/dL   Urobilinogen, UA 0.2 0.0 - 1.0 mg/dL   Nitrite NEGATIVE NEGATIVE   Leukocytes, UA  NEGATIVE NEGATIVE     ED Clinical Impression  Secondary hypertension, unspecified   ED Assessment/Plan  Per chart review, patient has taken hydrochlorothiazide 25 mg daily, amlodipine 5 mg daily, hydrochlorothiazide/lisinopril, and clonidine in the past. Most recent visit in 08/2013 for HTN patient was on 25 mg of hctz only.  Patient has evidence of kidney disease/kidney damage, but unsure how acute this is. Patient's creatinine 1.5, up from 1.3 on 08/29/2013. Patient has proteinuria, which is new compared to urinalysis from 3 years ago. Will start on lisinopril 10 mg daily because of this. Goal BP will be below 150/90.  Pt hypertensive today.  Has not taken BP meds in months.. Pt has no evidence of acute end organ damage. Pt denies any CNS type sx such as change in  his HA, visual changes, focal paresis, or new onset seizure activity. He is acting normally. He is not vomiting. Doubt hypertensive encephalopathy. Pt denies any CV sx such as CP, dyspnea, palpitations,  he does have some lower extremity edema, but states that this is present only at the end of the day. No tearing  pain radiating to back or abd. Pt denied any renal sx such as anuria or hematuria. D/w patient that he needs to find a primary care physician. We'll provide referral to a primary care practice.  Also Discussed importance of lifestyle modifications as important first steps, and the importance of taking  BP medications.  return here in a week to 10 days for repeat blood pressure if unable to find a primary care physician by then. Giving patient clinic SWs' card for assistance in finding a primary care physician. Giving several referrals on discharge instructions. Discussed signs of hypertensive emergency with patient and other signs and symptoms that should prompt return to the ER. Pt to f/u as OP.   Discussed labs, MDM, plan and followup with patient . Discussed sn/sx that should prompt return to the UC or ED. Patient agrees with  plan. Family agrees with plan  *This clinic note was created using Dragon dictation software. Therefore, there may be occasional mistakes despite careful proofreading.  ?  Melynda Ripple, MD 03/07/15 1551  Melynda Ripple, MD 03/07/15 [phone]

## 2015-04-03 ENCOUNTER — Encounter (HOSPITAL_COMMUNITY): Payer: Self-pay | Admitting: Emergency Medicine

## 2015-04-03 ENCOUNTER — Emergency Department (INDEPENDENT_AMBULATORY_CARE_PROVIDER_SITE_OTHER)
Admission: EM | Admit: 2015-04-03 | Discharge: 2015-04-03 | Disposition: A | Discharge status: Home / Self Care | Payer: Medicare Other | Source: Home / Self Care | Attending: Family Medicine | Admitting: Family Medicine

## 2015-04-03 DIAGNOSIS — R609 Edema, unspecified: Secondary | ICD-10-CM

## 2015-04-03 DIAGNOSIS — I10 Essential (primary) hypertension: Secondary | ICD-10-CM | POA: Diagnosis not present

## 2015-04-03 LAB — POCT I-STAT, CHEM 8
BUN: 14 mg/dL (ref 6–20)
CALCIUM ION: 1.19 mmol/L (ref 1.13–1.30)
Chloride: 101 mmol/L (ref 101–111)
Creatinine, Ser: 1.4 mg/dL — ABNORMAL HIGH (ref 0.61–1.24)
GLUCOSE: 95 mg/dL (ref 65–99)
HCT: 44 % (ref 39.0–52.0)
HEMOGLOBIN: 15 g/dL (ref 13.0–17.0)
Potassium: 3.9 mmol/L (ref 3.5–5.1)
SODIUM: 140 mmol/L (ref 135–145)
TCO2: 28 mmol/L (ref 0–100)

## 2015-04-03 MED ORDER — POTASSIUM CHLORIDE CRYS ER 10 MEQ PO TBCR: 10.0000 meq | EXTENDED_RELEASE_TABLET | Freq: Once | ORAL | Status: DC

## 2015-04-03 MED ORDER — LISINOPRIL-HYDROCHLOROTHIAZIDE 10-12.5 MG PO TABS: 1.0000 | ORAL_TABLET | Freq: Every day | ORAL | Status: DC

## 2015-04-03 NOTE — ED Notes (Signed)
Patient requesting refill of medication.  Patient has been to ucc before to get medication refill. Patient was unable to get a pcp- no money.  Patient took medication this morning. Marland Kitchen

## 2015-04-03 NOTE — ED Provider Notes (Signed)
CSN: [id number]     Arrival date & time 04/03/15  1740 History   First MD Initiated Contact with Patient 04/03/15 1842     Chief Complaint  Patient presents with  . Medication Refill   (Consider location/radiation/quality/duration/timing/severity/associated sxs/prior Treatment) HPI Comments: 60-year-old male comes to the urgent care for primary care, specifically for blood pressure medicine refilled. He was here one month ago and was given information for follow-up with PCP. He has not made efforts to obtain a PCP since then. He has no complaints today he denies chest pain, shortness of breath. It was noted that he had a slight increase in his creatinine level last month.   Past Medical History  Diagnosis Date  . Knee pain   . Hypertension   . Arthritis   . Chronic low back pain     Is supposed to have surgery soon at L4-5   Past Surgical History  Procedure Laterality Date  . Knee surgery  08    rt arthroscopy  . 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 Joint replacement      knee 05/2011   No family history on file. Social History  Substance Use Topics  . Smoking status: Former Smoker -- 0.50 packs/day    Quit date: 04/27/1986  . Smokeless tobacco: None  . Alcohol Use: No    Review of Systems  Constitutional: Negative.   HENT: Negative.   Respiratory: Negative.   Cardiovascular: Positive for leg swelling.  Genitourinary: Negative.   Musculoskeletal: Negative.   Skin: Negative.     Allergies  Review of patient's allergies indicates no known allergies.  Home Medications   Prior to Admission medications   Medication Sig Start Date End Date Taking? Authorizing Provider  lisinopril (PRINIVIL,ZESTRIL) 10 MG tablet Take 1 tablet (10 mg total) by mouth daily. 03/05/15  Yes Melynda Ripple, MD   lisinopril-hydrochlorothiazide (ZESTORETIC) 10-12.5 MG tablet Take 1 tablet by mouth daily. 04/03/15   Janne Napoleon, NP  potassium chloride (K-DUR) 10 MEQ tablet Take 10 mEq by mouth daily.     Historical Provider, MD  potassium chloride (K-DUR,KLOR-CON) 10 MEQ tablet Take 1 tablet (10 mEq total) by mouth once. 04/03/15   Janne Napoleon, NP  ranitidine (ZANTAC) 150 MG tablet Take 1 tablet (150 mg total) by mouth 2 (two) times daily. 07/13/13   Harden Mo, MD  rosuvastatin (CRESTOR) 10 MG tablet Take 10 mg by mouth daily.    Historical Provider, MD  sildenafil (VIAGRA) 100 MG tablet Take 1 tablet (100 mg total) by mouth daily as needed for erectile dysfunction. 10/12/12   Harden Mo, MD  traZODone (DESYREL) 150 MG tablet Take 150 mg by mouth at bedtime.    Historical Provider, MD   Meds Ordered and Administered this Visit  Medications - No data to display  BP 161/83 mmHg  Pulse 90  Temp(Src) 97.9 F (36.6 C) (Oral)  Resp 16  SpO2 97% No data found.   Physical Exam  Constitutional: He appears well-developed and well-nourished. No distress.  Neck: Neck supple.  Cardiovascular: Normal rate, regular rhythm and normal heart sounds.   Pulmonary/Chest: Effort normal and breath sounds normal. No respiratory distress. He has no wheezes. He has no rales.  Musculoskeletal: Normal range of motion.  Bilateral lower extremities with 2+ nonpitting edema.  Neurological: He is alert. He exhibits normal muscle tone.  Skin: Skin is warm and dry.  Psychiatric: He has a normal mood and affect.  Nursing note and vitals reviewed.   ED Course  Procedures (including critical care time)  Labs Review Labs Reviewed  POCT I-STAT, CHEM 8 - Abnormal; Notable for the following:    Creatinine, Ser 1.40 (*)    All other components within normal limits   Results for orders placed or performed during the hospital encounter of 04/03/15  I-STAT, chem 8  Result Value Ref Range   Sodium 140 135 - 145 mmol/L    Potassium 3.9 3.5 - 5.1 mmol/L   Chloride 101 101 - 111 mmol/L   BUN 14 6 - 20 mg/dL   Creatinine, Ser 1.40 (H) 0.61 - 1.24 mg/dL   Glucose, Bld 95 65 - 99 mg/dL   Calcium, Ion 1.19 1.13 - 1.30 mmol/L   TCO2 28 0 - 100 mmol/L   Hemoglobin 15.0 13.0 - 17.0 g/dL   HCT 44.0 39.0 - 52.0 %     Imaging Review No results found.   Visual Acuity Review  Right Eye Distance:   Left Eye Distance:   Bilateral Distance:    Right Eye Near:   Left Eye Near:    Bilateral Near:         MDM   1. Essential hypertension   2. Peripheral edema    There is a risk of taking medications prescribed by a health care provider without obtaining a complete history, labwork or proper physical exam, etc. This cannot be completed adequately at an urgent care. There can be multiple problems, some serious,  associated with medications and undetermined conditions of your health status. This action is performed as a last resort in order to supply you with medication. By receiving these prescriptions you are ackowleging and accepting these risks and will not hold the prescriber or any agent of [facility] as responsible for any adverse outcomes.  Meds ordered this encounter  Medications  . lisinopril-hydrochlorothiazide (ZESTORETIC) 10-12.5 MG tablet    Sig: Take 1 tablet by mouth daily.    Dispense:  30 tablet    Refill:  0    Order Specific Question:  Supervising Provider    Answer:  Billy Fischer [phone]  . potassium chloride (K-DUR,KLOR-CON) 10 MEQ tablet    Sig: Take 1 tablet (10 mEq total) by mouth once.    Dispense:  30 tablet    Refill:  0    Order Specific Question:  Supervising Provider    Answer:  Billy Fischer [[id number]]     Janne Napoleon, NP 04/03/15 1934

## 2015-04-03 NOTE — Discharge Instructions (Signed)
There is a risk of taking medications prescribed by a health care provider without obtaining a complete history, labwork or proper physical exam, etc. This cannot be completed adequately at an urgent care. There can be multiple problems, some serious,  associated with medications and undetermined conditions of your health status. This action is performed as a last resort in order to supply you with medication. By receiving these prescriptions you are ackowleging and accepting these risks and will not hold the prescriber or any agent of [facility] as responsible for any adverse outcomes.  You must see a primary care doctor soon. The [facility] cannot continue to provide primary care and refills for you.     Edema Edema is an abnormal buildup of fluids in your bodytissues. Edema is somewhatdependent on gravity to pull the fluid to the lowest place in your body. That makes the condition more common in the legs and thighs (lower extremities). Painless swelling of the feet and ankles is common and becomes more likely as you get older. It is also common in looser tissues, like around your eyes.  When the affected area is squeezed, the fluid may move out of that spot and leave a dent for a few moments. This dent is called pitting.  CAUSES  There are many possible causes of edema. Eating too much salt and being on your feet or sitting for a long time can cause edema in your legs and ankles. Hot weather may make edema worse. Common medical causes of edema include:  Heart failure.  Liver disease.  Kidney disease.  Weak blood vessels in your legs.  Cancer.  An injury.  Pregnancy.  Some medications.  Obesity. SYMPTOMS  Edema is usually painless.Your skin may look swollen or shiny.  DIAGNOSIS  Your health care provider may be able to diagnose edema by asking about your medical history and doing a physical exam. You may need to have tests such as X-rays, an  electrocardiogram, or blood tests to check for medical conditions that may cause edema.  TREATMENT  Edema treatment depends on the cause. If you have heart, liver, or kidney disease, you need the treatment appropriate for these conditions. General treatment may include:  Elevation of the affected body part above the level of your heart.  Compression of the affected body part. Pressure from elastic bandages or support stockings squeezes the tissues and forces fluid back into the blood vessels. This keeps fluid from entering the tissues.  Restriction of fluid and salt intake.  Use of a water pill (diuretic). These medications are appropriate only for some types of edema. They pull fluid out of your body and make you urinate more often. This gets rid of fluid and reduces swelling, but diuretics can have side effects. Only use diuretics as directed by your health care provider. HOME CARE INSTRUCTIONS   Keep the affected body part above the level of your heart when you are lying down.   Do not sit still or stand for prolonged periods.   Do not put anything directly under your knees when lying down.  Do not wear constricting clothing or garters on your upper legs.   Exercise your legs to work the fluid back into your blood vessels. This may help the swelling go down.   Wear elastic bandages or support stockings to reduce ankle swelling as directed by your health care provider.   Eat a low-salt diet to reduce fluid if your health  care provider recommends it.   Only take medicines as directed by your health care provider. SEEK MEDICAL CARE IF:   Your edema is not responding to treatment.  You have heart, liver, or kidney disease and notice symptoms of edema.  You have edema in your legs that does not improve after elevating them.   You have sudden and unexplained weight gain. SEEK IMMEDIATE MEDICAL CARE IF:   You develop shortness of breath or chest pain.   You cannot  breathe when you lie down.  You develop pain, redness, or warmth in the swollen areas.   You have heart, liver, or kidney disease and suddenly get edema.  You have a fever and your symptoms suddenly get worse. MAKE SURE YOU:   Understand these instructions.  Will watch your condition.  Will get help right away if you are not doing well or get worse.   This information is not intended to replace advice given to you by your health care provider. Make sure you discuss any questions you have with your health care provider.   Document Released: 03/01/2005 Document Revised: 03/22/2014 Document Reviewed: 12/22/2012 Elsevier Interactive Patient Education 2016 Elsevier Inc.  Edema Edema is an abnormal buildup of fluids in your bodytissues. Edema is somewhatdependent on gravity to pull the fluid to the lowest place in your body. That makes the condition more common in the legs and thighs (lower extremities). Painless swelling of the feet and ankles is common and becomes more likely as you get older. It is also common in looser tissues, like around your eyes.  When the affected area is squeezed, the fluid may move out of that spot and leave a dent for a few moments. This dent is called pitting.  CAUSES  There are many possible causes of edema. Eating too much salt and being on your feet or sitting for a long time can cause edema in your legs and ankles. Hot weather may make edema worse. Common medical causes of edema include:  Heart failure.  Liver disease.  Kidney disease.  Weak blood vessels in your legs.  Cancer.  An injury.  Pregnancy.  Some medications.  Obesity. SYMPTOMS  Edema is usually painless.Your skin may look swollen or shiny.  DIAGNOSIS  Your health care provider may be able to diagnose edema by asking about your medical history and doing a physical exam. You may need to have tests such as X-rays, an electrocardiogram, or blood tests to check for medical  conditions that may cause edema.  TREATMENT  Edema treatment depends on the cause. If you have heart, liver, or kidney disease, you need the treatment appropriate for these conditions. General treatment may include:  Elevation of the affected body part above the level of your heart.  Compression of the affected body part. Pressure from elastic bandages or support stockings squeezes the tissues and forces fluid back into the blood vessels. This keeps fluid from entering the tissues.  Restriction of fluid and salt intake.  Use of a water pill (diuretic). These medications are appropriate only for some types of edema. They pull fluid out of your body and make you urinate more often. This gets rid of fluid and reduces swelling, but diuretics can have side effects. Only use diuretics as directed by your health care provider. HOME CARE INSTRUCTIONS   Keep the affected body part above the level of your heart when you are lying down.   Do not sit still or stand for prolonged periods.  Do not put anything directly under your knees when lying down.  Do not wear constricting clothing or garters on your upper legs.   Exercise your legs to work the fluid back into your blood vessels. This may help the swelling go down.   Wear elastic bandages or support stockings to reduce ankle swelling as directed by your health care provider.   Eat a low-salt diet to reduce fluid if your health care provider recommends it.   Only take medicines as directed by your health care provider. SEEK MEDICAL CARE IF:   Your edema is not responding to treatment.  You have heart, liver, or kidney disease and notice symptoms of edema.  You have edema in your legs that does not improve after elevating them.   You have sudden and unexplained weight gain. SEEK IMMEDIATE MEDICAL CARE IF:   You develop shortness of breath or chest pain.   You cannot breathe when you lie down.  You develop pain, redness, or  warmth in the swollen areas.   You have heart, liver, or kidney disease and suddenly get edema.  You have a fever and your symptoms suddenly get worse. MAKE SURE YOU:   Understand these instructions.  Will watch your condition.  Will get help right away if you are not doing well or get worse.   This information is not intended to replace advice given to you by your health care provider. Make sure you discuss any questions you have with your health care provider.   Document Released: 03/01/2005 Document Revised: 03/22/2014 Document Reviewed: 12/22/2012 Elsevier Interactive Patient Education 2016 [employer].  Hypertension Hypertension is another name for high blood pressure. High blood pressure forces your heart to work harder to pump blood. A blood pressure reading has two numbers, which includes a higher number over a lower number (example: 110/72). HOME CARE   Have your blood pressure rechecked by your doctor.  Only take medicine as told by your doctor. Follow the directions carefully. The medicine does not work as well if you skip doses. Skipping doses also puts you at risk for problems.  Do not smoke.  Monitor your blood pressure at home as told by your doctor. GET HELP IF:  You think you are having a reaction to the medicine you are taking.  You have repeat headaches or feel dizzy.  You have puffiness (swelling) in your ankles.  You have trouble with your vision. GET HELP RIGHT AWAY IF:   You get a very bad headache and are confused.  You feel weak, numb, or faint.  You get chest or belly (abdominal) pain.  You throw up (vomit).  You cannot breathe very well. MAKE SURE YOU:   Understand these instructions.  Will watch your condition.  Will get help right away if you are not doing well or get worse.   This information is not intended to replace advice given to you by your health care provider. Make sure you discuss any questions you have with your  health care provider.   Document Released: 08/18/2007 Document Revised: 03/06/2013 Document Reviewed: 12/22/2012 Elsevier Interactive Patient Education 2016 Elsevier Inc.  Peripheral Edema You have swelling in your legs (peripheral edema). This swelling is due to excess accumulation of salt and water in your body. Edema may be a sign of heart, kidney or liver disease, or a side effect of a medication. It may also be due to problems in the leg veins. Elevating your legs and using special support stockings  may be very helpful, if the cause of the swelling is due to poor venous circulation. Avoid long periods of standing, whatever the cause. Treatment of edema depends on identifying the cause. Chips, pretzels, pickles and other salty foods should be avoided. Restricting salt in your diet is almost always needed. Water pills (diuretics) are often used to remove the excess salt and water from your body via urine. These medicines prevent the kidney from reabsorbing sodium. This increases urine flow. Diuretic treatment may also result in lowering of potassium levels in your body. Potassium supplements may be needed if you have to use diuretics daily. Daily weights can help you keep track of your progress in clearing your edema. You should call your caregiver for follow up care as recommended. SEEK IMMEDIATE MEDICAL CARE IF:   You have increased swelling, pain, redness, or heat in your legs.  You develop shortness of breath, especially when lying down.  You develop chest or abdominal pain, weakness, or fainting.  You have a fever.   This information is not intended to replace advice given to you by your health care provider. Make sure you discuss any questions you have with your health care provider.   Document Released: 04/08/2004 Document Revised: 05/24/2011 Document Reviewed: 09/11/2014 Elsevier Interactive Patient Education [employer].

## 2016-01-16 ENCOUNTER — Encounter (HOSPITAL_COMMUNITY): Payer: Self-pay | Admitting: Emergency Medicine

## 2016-01-16 ENCOUNTER — Ambulatory Visit (HOSPITAL_COMMUNITY)
Admission: EM | Admit: 2016-01-16 | Discharge: 2016-01-16 | Disposition: A | Discharge status: Home / Self Care | Payer: Medicare Other | Attending: Family Medicine | Admitting: Family Medicine

## 2016-01-16 DIAGNOSIS — M7662 Achilles tendinitis, left leg: Secondary | ICD-10-CM | POA: Diagnosis not present

## 2016-01-16 NOTE — ED Provider Notes (Signed)
[facility]    CSN: [id number] Arrival date & time: 01/16/16  1002     History   Chief Complaint Chief Complaint  Patient presents with  . Foot Pain    HPI Eric Macdonald is a 60 y.o. male.   The history is provided by the patient.  Foot Pain  This is a new problem. The current episode started more than 1 week ago (1 month of sx.). The problem has been gradually worsening. The symptoms are aggravated by walking.    Past Medical History:  Diagnosis Date  . Arthritis   . Chronic low back pain    Is supposed to have surgery soon at [facility]  . Hypertension   . Knee pain     There are no active problems to display for this patient.   Past Surgical History:  Procedure Laterality Date  . JOINT REPLACEMENT     knee 05/2011  . KNEE SURGERY  7   rt arthroscopy  . 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 Authorizing Provider  lisinopril (PRINIVIL,ZESTRIL) 10 MG tablet Take 1 tablet (10 mg total) by mouth daily. 03/05/15  Yes Melynda Ripple, MD  lisinopril-hydrochlorothiazide (ZESTORETIC) 10-12.5 MG tablet Take 1 tablet by mouth daily. 04/03/15  Yes Janne Napoleon, NP  potassium chloride (K-DUR) 10 MEQ tablet Take 10 mEq by mouth daily.     Historical Provider, MD  potassium chloride (K-DUR,KLOR-CON) 10 MEQ tablet Take 1 tablet (10 mEq total) by mouth once. 04/03/15   Janne Napoleon, NP  ranitidine (ZANTAC) 150 MG tablet Take 1 tablet (150 mg total) by mouth 2 (two) times daily. 07/13/13   Harden Mo, MD  rosuvastatin (CRESTOR) 10 MG tablet Take 10 mg by mouth daily.    Historical Provider, MD  sildenafil (VIAGRA) 100 MG tablet Take 1 tablet (100 mg total) by mouth daily as needed for erectile  dysfunction. 10/12/12   Harden Mo, MD  traZODone (DESYREL) 150 MG tablet Take 150 mg by mouth at bedtime.    Historical Provider, MD    Family History History reviewed. No pertinent family history.  Social History Social History  Substance Use Topics  . Smoking status: Former Smoker    Packs/day: 0.50    Quit date: 04/27/1986  . Smokeless tobacco: Never Used  . Alcohol use No     Allergies   Review of patient's allergies indicates no known allergies.   Review of Systems Review of Systems  Musculoskeletal: Positive for gait problem and myalgias.  All other systems reviewed and are negative.    Physical Exam Triage Vital Signs ED Triage Vitals  Enc Vitals Group     BP 01/16/16 1015 146/96     Pulse Rate 01/16/16 1015 91     Resp 01/16/16 1015 18     Temp 01/16/16 1015 98.2 F (36.8 C)     Temp Source 01/16/16 1015 Oral     SpO2 01/16/16 1015 97 %     Weight --      Height --      Head Circumference --      Peak Flow --      Pain Score 01/16/16 1020 5  Pain Loc --      Pain Edu? --      Excl. in [place]? --    No data found.   Updated Vital Signs BP 146/96 (BP Location: Left Arm)   Pulse 91   Temp 98.2 F (36.8 C) (Oral)   Resp 18   SpO2 97%   Visual Acuity Right Eye Distance:   Left Eye Distance:   Bilateral Distance:    Right Eye Near:   Left Eye Near:    Bilateral Near:     Physical Exam  Constitutional: He appears well-developed and well-nourished.  Musculoskeletal: He exhibits tenderness.       Left foot: There is tenderness and bony tenderness. There is normal capillary refill and no deformity.       Feet:  Nursing note and vitals reviewed.    UC Treatments / Results  Labs (all labs ordered are listed, but only abnormal results are displayed) Labs Reviewed - No data to display  EKG  EKG Interpretation None       Radiology No results found.  Procedures Procedures (including critical care time)  Medications Ordered in  UC Medications - No data to display   Initial Impression / Assessment and Plan / UC Course  I have reviewed the triage vital signs and the nursing notes.  Pertinent labs & imaging results that were available during my care of the patient were reviewed by me and considered in my medical decision making (see chart for details).  Clinical Course      Final Clinical Impressions(s) / UC Diagnoses   Final diagnoses:  None    New Prescriptions New Prescriptions   No medications on file     Billy Fischer, MD 01/16/16 1038

## 2016-01-16 NOTE — ED Triage Notes (Signed)
The patient presented to the [facility] with a complaint of pain to his left foot for 1 month. The patient stated that he has a spot on his left heel that is sore and he can not weight bear on that spot. The patient denied any known injury.

## 2016-01-16 NOTE — Discharge Instructions (Signed)
Go directly to see dr Percell Miller.

## 2019-04-19 ENCOUNTER — Ambulatory Visit: Payer: Medicare Other | Attending: Internal Medicine

## 2019-04-19 DIAGNOSIS — Z23 Encounter for immunization: Secondary | ICD-10-CM | POA: Insufficient documentation

## 2019-04-19 NOTE — Progress Notes (Signed)
   Covid-19 Vaccination Clinic  Name:  LABRIAN BARSAMIAN    MRN: [id number] DOB: April 14, 1955  04/19/2019  Mr. Barnish was observed post Covid-19 immunization for 15 minutes without incidence. He was provided with Vaccine Information Sheet and instruction to access the V-Safe system.   Mr. Barder was instructed to call 911 with any severe reactions post vaccine: Marland Kitchen Difficulty breathing  . Swelling of your face and throat  . A fast heartbeat  . A bad rash all over your body  . Dizziness and weakness    Immunizations Administered    Name Date Dose VIS Date Route   Pfizer COVID-19 Vaccine 04/19/2019  8:43 AM 0.3 mL 02/23/2019 Intramuscular   Manufacturer: [place]   Lot: [id number]   [place]: [id number]

## 2019-05-14 ENCOUNTER — Ambulatory Visit: Payer: Medicare Other | Attending: Internal Medicine

## 2019-05-14 DIAGNOSIS — Z23 Encounter for immunization: Secondary | ICD-10-CM | POA: Insufficient documentation

## 2019-05-14 NOTE — Progress Notes (Signed)
   Covid-19 Vaccination Clinic  Name:  Eric Macdonald    MRN: [id number] DOB: 07-Mar-1956  05/14/2019  Eric Macdonald was observed post Covid-19 immunization for 15 minutes without incidence. He was provided with Vaccine Information Sheet and instruction to access the V-Safe system.   Eric Macdonald was instructed to call 911 with any severe reactions post vaccine: Marland Kitchen Difficulty breathing  . Swelling of your face and throat  . A fast heartbeat  . A bad rash all over your body  . Dizziness and weakness    Immunizations Administered    Name Date Dose VIS Date Route   Pfizer COVID-19 Vaccine 05/14/2019  8:41 AM 0.3 mL 02/23/2019 Intramuscular   Manufacturer: [place]   Lot: [id number]   [place]: [id number]

## 2020-06-02 ENCOUNTER — Ambulatory Visit: Payer: Self-pay | Admitting: General Surgery

## 2020-06-02 NOTE — H&P (Signed)
The patient is a 65 year old male who presents with a pilonidal cyst. 65 year old male who presents to the office for evaluation of pilonidal disease. He is status post his first episode. At the end of January. This was treated with antibiotics. His symptoms have subsided. He is here today for surgical evaluation.   Past Surgical History Janeann Forehand, CNA; 06/02/2020 8:58 AM) Vasectomy  Diagnostic Studies History Janeann Forehand, CNA; 06/02/2020 8:58 AM) Colonoscopy never  Allergies Janeann Forehand, CNA; 06/02/2020 8:58 AM) No Known Drug Allergies [06/02/2020]: Allergies Reconciled  Medication History Janeann Forehand, CNA; 06/02/2020 8:59 AM) Vitamin D (Ergocalciferol) (1.25 MG(50000 UT) Capsule, Oral) Active. Sulfamethoxazole-Trimethoprim (800-160MG  Tablet, Oral) Active. metroNIDAZOLE (500MG  Tablet, Oral) Active. methylPREDNISolone (4MG  Tab Ther Pack, Oral) Active. Meloxicam (15MG  Tablet, Oral) Active. Cyclobenzaprine HCl (10MG  Tablet, Oral) Active. Cephalexin (500MG  Capsule, Oral) Active. Amoxicillin-Pot Clavulanate (875-125MG  Tablet, Oral) Active. HYDROcodone-Acetaminophen (5-325MG  Tablet, Oral) Active. Medications Reconciled  Social History Janeann Forehand, CNA; 06/02/2020 8:58 AM) Alcohol use Occasional alcohol use. Caffeine use Tea. No drug use Tobacco use Never smoker.  Family History Janeann Forehand, CNA; 06/02/2020 8:58 AM) Cerebrovascular Accident Daughter. Colon Cancer Family Members In General.  Other Problems Janeann Forehand, CNA; 06/02/2020 8:58 AM) No pertinent past medical history     Review of Systems Janeann Forehand CNA; 06/02/2020 8:58 AM) General Not Present- Appetite Loss, Chills, Fatigue, Fever, Night Sweats, Weight Gain and Weight Loss. Skin Present- New Lesions. Not Present- Change in Wart/Mole, Dryness, Hives, Jaundice, Non-Healing Wounds, Rash and Ulcer. HEENT Not Present- Earache, Hearing Loss, Hoarseness, Nose Bleed,  Oral Ulcers, Ringing in the Ears, Seasonal Allergies, Sinus Pain, Sore Throat, Visual Disturbances, Wears glasses/contact lenses and Yellow Eyes. Respiratory Not Present- Bloody sputum, Chronic Cough, Difficulty Breathing, Snoring and Wheezing. Breast Not Present- Breast Mass, Breast Pain, Nipple Discharge and Skin Changes. Cardiovascular Not Present- Chest Pain, Difficulty Breathing Lying Down, Leg Cramps, Palpitations, Rapid Heart Rate, Shortness of Breath and Swelling of Extremities. Gastrointestinal Not Present- Abdominal Pain, Bloating, Bloody Stool, Change in Bowel Habits, Chronic diarrhea, Constipation, Difficulty Swallowing, Excessive gas, Gets full quickly at meals, Hemorrhoids, Indigestion, Nausea, Rectal Pain and Vomiting. Male Genitourinary Not Present- Blood in Urine, Change in Urinary Stream, Frequency, Impotence, Nocturia, Painful Urination, Urgency and Urine Leakage. Musculoskeletal Not Present- Back Pain, Joint Pain, Joint Stiffness, Muscle Pain, Muscle Weakness and Swelling of Extremities. Neurological Not Present- Decreased Memory, Fainting, Headaches, Numbness, Seizures, Tingling, Tremor, Trouble walking and Weakness. Psychiatric Not Present- Anxiety, Bipolar, Change in Sleep Pattern, Depression, Fearful and Frequent crying. Endocrine Not Present- Cold Intolerance, Excessive Hunger, Hair Changes, Heat Intolerance, Hot flashes and New Diabetes. Hematology Not Present- Blood Thinners, Easy Bruising, Excessive bleeding, Gland problems, HIV and Persistent Infections.  Vitals (Donyelle Alston CNA; 06/02/2020 8:59 AM) 06/02/2020 8:59 AM Weight: 251.13 lb Height: 74in Body Surface Area: 2.39 m Body Mass Index: 32.24 kg/m  Temp.: 98.60F  Pulse: 83 (Regular)  P.OX: 99% (Room air) BP: 140/86(Sitting, Left Arm, Standard)        Physical Exam Leighton Ruff MD; 1/61/0960 9:12 AM)  General Mental Status-Alert. General Appearance-Cooperative.  Rectal Note: No  active infection noted. Pilonidal pit noted at midline, rather large with several strands of hair embedded into it. These were removed.    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 He has since recovered but continues to have a large pilonidal pit at midline. He  discussed a surgery to remove the pit and any inflammatory tissue underneath. We discussed the recurrence rate, and healing times. We discussed postoperative pain as well. 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 All questions were answered. He wishes to proceed with surgery.

## 2020-06-02 NOTE — H&P (View-Only) (Signed)
The patient is a 65 year old male who presents with a pilonidal cyst. 65 year old male who presents to the office for evaluation of pilonidal disease. He is status post his first episode. At the end of January. This was treated with antibiotics. His symptoms have subsided. He is here today for surgical evaluation.   Past Surgical History Janeann Forehand, CNA; 06/02/2020 8:58 AM) Vasectomy  Diagnostic Studies History Janeann Forehand, CNA; 06/02/2020 8:58 AM) Colonoscopy never  Allergies Janeann Forehand, CNA; 06/02/2020 8:58 AM) No Known Drug Allergies [06/02/2020]: Allergies Reconciled  Medication History Janeann Forehand, CNA; 06/02/2020 8:59 AM) Vitamin D (Ergocalciferol) (1.25 MG(50000 UT) Capsule, Oral) Active. Sulfamethoxazole-Trimethoprim (800-160MG  Tablet, Oral) Active. metroNIDAZOLE (500MG  Tablet, Oral) Active. methylPREDNISolone (4MG  Tab Ther Pack, Oral) Active. Meloxicam (15MG  Tablet, Oral) Active. Cyclobenzaprine HCl (10MG  Tablet, Oral) Active. Cephalexin (500MG  Capsule, Oral) Active. Amoxicillin-Pot Clavulanate (875-125MG  Tablet, Oral) Active. HYDROcodone-Acetaminophen (5-325MG  Tablet, Oral) Active. Medications Reconciled  Social History Janeann Forehand, CNA; 06/02/2020 8:58 AM) Alcohol use Occasional alcohol use. Caffeine use Tea. No drug use Tobacco use Never smoker.  Family History Janeann Forehand, CNA; 06/02/2020 8:58 AM) Cerebrovascular Accident Daughter. Colon Cancer Family Members In General.  Other Problems Janeann Forehand, CNA; 06/02/2020 8:58 AM) No pertinent past medical history     Review of Systems Janeann Forehand CNA; 06/02/2020 8:58 AM) General Not Present- Appetite Loss, Chills, Fatigue, Fever, Night Sweats, Weight Gain and Weight Loss. Skin Present- New Lesions. Not Present- Change in Wart/Mole, Dryness, Hives, Jaundice, Non-Healing Wounds, Rash and Ulcer. HEENT Not Present- Earache, Hearing Loss, Hoarseness, Nose Bleed,  Oral Ulcers, Ringing in the Ears, Seasonal Allergies, Sinus Pain, Sore Throat, Visual Disturbances, Wears glasses/contact lenses and Yellow Eyes. Respiratory Not Present- Bloody sputum, Chronic Cough, Difficulty Breathing, Snoring and Wheezing. Breast Not Present- Breast Mass, Breast Pain, Nipple Discharge and Skin Changes. Cardiovascular Not Present- Chest Pain, Difficulty Breathing Lying Down, Leg Cramps, Palpitations, Rapid Heart Rate, Shortness of Breath and Swelling of Extremities. Gastrointestinal Not Present- Abdominal Pain, Bloating, Bloody Stool, Change in Bowel Habits, Chronic diarrhea, Constipation, Difficulty Swallowing, Excessive gas, Gets full quickly at meals, Hemorrhoids, Indigestion, Nausea, Rectal Pain and Vomiting. Male Genitourinary Not Present- Blood in Urine, Change in Urinary Stream, Frequency, Impotence, Nocturia, Painful Urination, Urgency and Urine Leakage. Musculoskeletal Not Present- Back Pain, Joint Pain, Joint Stiffness, Muscle Pain, Muscle Weakness and Swelling of Extremities. Neurological Not Present- Decreased Memory, Fainting, Headaches, Numbness, Seizures, Tingling, Tremor, Trouble walking and Weakness. Psychiatric Not Present- Anxiety, Bipolar, Change in Sleep Pattern, Depression, Fearful and Frequent crying. Endocrine Not Present- Cold Intolerance, Excessive Hunger, Hair Changes, Heat Intolerance, Hot flashes and New Diabetes. Hematology Not Present- Blood Thinners, Easy Bruising, Excessive bleeding, Gland problems, HIV and Persistent Infections.  Vitals (Donyelle Alston CNA; 06/02/2020 8:59 AM) 06/02/2020 8:59 AM Weight: 251.13 lb Height: 74in Body Surface Area: 2.39 m Body Mass Index: 32.24 kg/m  Temp.: 98.33F  Pulse: 83 (Regular)  P.OX: 99% (Room air) BP: 140/86(Sitting, Left Arm, Standard)        Physical Exam Leighton Ruff MD; 2/44/0102 9:12 AM)  General Mental Status-Alert. General Appearance-Cooperative.  Rectal Note: No  active infection noted. Pilonidal pit noted at midline, rather large with several strands of hair embedded into it. These were removed.    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 He has since recovered but continues to have a large pilonidal pit at midline. He  discussed a surgery to remove the pit and any inflammatory tissue underneath. We discussed the recurrence rate, and healing times. We discussed postoperative pain as well. 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 All questions were answered. He wishes to proceed with surgery.

## 2020-06-11 NOTE — Patient Instructions (Addendum)
DUE TO COVID-19 ONLY ONE VISITOR IS ALLOWED TO COME WITH YOU AND STAY IN THE WAITING ROOM ONLY DURING PRE OP AND PROCEDURE DAY OF SURGERY. THE 1 VISITOR  MAY VISIT WITH YOU AFTER SURGERY IN YOUR PRIVATE ROOM DURING VISITING HOURS ONLY!  YOU NEED TO HAVE A COVID 19 TEST ON: 06/17/20 @  2:15 PM , THIS TEST MUST BE DONE BEFORE SURGERY,  COVID TESTING SITE [place] [address], IT IS ON THE RIGHT GOING OUT [address] APPROXIMATELY  2 MINUTES PAST [employer] ON THE RIGHT. ONCE YOUR COVID TEST IS COMPLETED,  PLEASE BEGIN THE QUARANTINE INSTRUCTIONS AS OUTLINED IN YOUR HANDOUT.                Eric Macdonald   Your procedure is scheduled on: 06/20/20   Report to [facility] Main  Entrance   Report to admitting at: 12:30  PM     Call this number if you have problems the morning of surgery [phone]    Remember:  DRINK 2 PRESURGERY ENSURE DRINKS THE NIGHT BEFORE SURGERY AT  1000 PM AND 1 PRESURGERY DRINK THE DAY OF THE PROCEDURE 3 HOURS PRIOR TO SCHEDULED SURGERY. NO SOLIDS AFTER MIDNIGHT THE DAY PRIOR TO THE SURGERY. NOTHING BY MOUTH EXCEPT CLEAR LIQUIDS UNTIL THREE HOURS PRIOR TO SCHEDULED SURGERY. PLEASE FINISH PRESURGERY ENSURE DRINK PER SURGEON ORDER 3 HOURS PRIOR TO SCHEDULED SURGERY TIME WHICH NEEDS TO BE COMPLETED AT: 11:30 AM.  CLEAR LIQUID DIET  Foods Allowed                                                                     Foods Excluded  Coffee and tea, regular and decaf                             liquids that you cannot  Plain Jell-O any favor except red or purple                                           see through such as: Fruit ices (not with fruit pulp)                                     milk, soups, orange juice  Iced Popsicles                                    All solid food Carbonated beverages, regular and diet                                    Cranberry, grape and apple juices Sports drinks like Gatorade Lightly seasoned  clear broth or consume(fat free) Sugar, honey syrup  Sample Menu Breakfast  Lunch                                     Supper Cranberry juice                    Beef broth                            Chicken broth Jell-O                                     Grape juice                           Apple juice Coffee or tea                        Jell-O                                      Popsicle                                                Coffee or tea                        Coffee or tea  _____________________________________________________________________   DRINK PLENTY FLUIDS THE DAY OF THE PREP.   BRUSH YOUR TEETH MORNING OF SURGERY AND RINSE YOUR MOUTH OUT, NO CHEWING GUM CANDY OR MINTS.    Take these medicines the morning of surgery with A SIP OF WATER: AMLODIPINE.                                You may not have any metal on your body including hair pins and              piercings  Do not wear jewelry, lotions, powders or perfumes, deodorant             Men may shave face and neck.   Do not bring valuables to the hospital. [place].  Contacts, dentures or bridgework may not be worn into surgery.  Leave suitcase in the car. After surgery it may be brought to your room.     Patients discharged the day of surgery will not be allowed to drive home. IF YOU ARE HAVING SURGERY AND GOING HOME THE SAME DAY, YOU MUST HAVE AN ADULT TO DRIVE YOU HOME AND BE WITH YOU FOR 24 HOURS. YOU MAY GO HOME BY TAXI OR UBER OR ORTHERWISE, BUT AN ADULT MUST ACCOMPANY YOU HOME AND STAY WITH YOU FOR 24 HOURS.  Name and phone number of your driver:  Special Instructions: N/A              Please read over the following fact sheets you were given: _____________________________________________________________________         Eric Macdonald  Health - Preparing for Surgery Before surgery, you can play an important role.  Because skin is  not sterile, your skin needs to be as free of germs as possible.  You can reduce the number of germs on your skin by washing with CHG (chlorahexidine gluconate) soap before surgery.  CHG is an antiseptic cleaner which kills germs and bonds with the skin to continue killing germs even after washing. Please DO NOT use if you have an allergy to CHG or antibacterial soaps.  If your skin becomes reddened/irritated stop using the CHG and inform your nurse when you arrive at Short Stay. Do not shave (including legs and underarms) for at least 48 hours prior to the first CHG shower.  You may shave your face/neck. Please follow these instructions carefully:  1.  Shower with CHG Soap the night before surgery and the  morning of Surgery.  2.  If you choose to wash your hair, wash your hair first as usual with your  normal  shampoo.  3.  After you shampoo, rinse your hair and body thoroughly to remove the  shampoo.                           4.  Use CHG as you would any other liquid soap.  You can apply chg directly  to the skin and wash                       Gently with a scrungie or clean washcloth.  5.  Apply the CHG Soap to your body ONLY FROM THE NECK DOWN.   Do not use on face/ open                           Wound or open sores. Avoid contact with eyes, ears mouth and genitals (private parts).                       Wash face,  Genitals (private parts) with your normal soap.             6.  Wash thoroughly, paying special attention to the area where your surgery  will be performed.  7.  Thoroughly rinse your body with warm water from the neck down.  8.  DO NOT shower/wash with your normal soap after using and rinsing off  the CHG Soap.                9.  Pat yourself dry with a clean towel.            10.  Wear clean pajamas.            11.  Place clean sheets on your bed the night of your first shower and do not  sleep with pets. Day of Surgery : Do not apply any lotions/deodorants the morning of surgery.   Please wear clean clothes to the hospital/surgery center.  FAILURE TO FOLLOW THESE INSTRUCTIONS MAY RESULT IN THE CANCELLATION OF YOUR SURGERY PATIENT SIGNATURE_________________________________  NURSE SIGNATURE__________________________________  ________________________________________________________________________   Adam Phenix  An incentive spirometer is a tool that can help keep your lungs clear and active. This tool measures how well you are filling your lungs with each breath. Taking long deep breaths may help reverse or decrease the chance of developing breathing (pulmonary) problems (especially infection) following:  A long period of time  when you are unable to move or be active. BEFORE THE PROCEDURE   If the spirometer includes an indicator to show your best effort, your nurse or respiratory therapist will set it to a desired goal.  If possible, sit up straight or lean slightly forward. Try not to slouch.  Hold the incentive spirometer in an upright position. INSTRUCTIONS FOR USE  1. Sit on the edge of your bed if possible, or sit up as far as you can in bed or on a chair. 2. Hold the incentive spirometer in an upright position. 3. Breathe out normally. 4. Place the mouthpiece in your mouth and seal your lips tightly around it. 5. Breathe in slowly and as deeply as possible, raising the piston or the ball toward the top of the column. 6. Hold your breath for 3-5 seconds or for as long as possible. Allow the piston or ball to fall to the bottom of the column. 7. Remove the mouthpiece from your mouth and breathe out normally. 8. Rest for a few seconds and repeat Steps 1 through 7 at least 10 times every 1-2 hours when you are awake. Take your time and take a few normal breaths between deep breaths. 9. The spirometer may include an indicator to show your best effort. Use the indicator as a goal to work toward during each repetition. 10. After each set of 10 deep  breaths, practice coughing to be sure your lungs are clear. If you have an incision (the cut made at the time of surgery), support your incision when coughing by placing a pillow or rolled up towels firmly against it. Once you are able to get out of bed, walk around indoors and cough well. You may stop using the incentive spirometer when instructed by your caregiver.  RISKS AND COMPLICATIONS  Take your time so you do not get dizzy or light-headed.  If you are in pain, you may need to take or ask for pain medication before doing incentive spirometry. It is harder to take a deep breath if you are having pain. AFTER USE  Rest and breathe slowly and easily.  It can be helpful to keep track of a log of your progress. Your caregiver can provide you with a simple table to help with this. If you are using the spirometer at home, follow these instructions: [place] IF:   You are having difficultly using the spirometer.  You have trouble using the spirometer as often as instructed.  Your pain medication is not giving enough relief while using the spirometer.  You develop fever of 100.5 F (38.1 C) or higher. SEEK IMMEDIATE MEDICAL CARE IF:   You cough up bloody sputum that had not been present before.  You develop fever of 102 F (38.9 C) or greater.  You develop worsening pain at or near the incision site. MAKE SURE YOU:   Understand these instructions.  Will watch your condition.  Will get help right away if you are not doing well or get worse. Document Released: 07/12/2006 Document Revised: 05/24/2011 Document Reviewed: 09/12/2006 [facility] Patient Information 2014 [place], [place].   ________________________________________________________________________

## 2020-06-12 ENCOUNTER — Encounter (HOSPITAL_COMMUNITY)
Admission: RE | Admit: 2020-06-12 | Discharge: 2020-06-12 | Disposition: A | Discharge status: Home / Self Care | Payer: Medicare Other | Source: Ambulatory Visit | Attending: General Surgery | Admitting: General Surgery

## 2020-06-12 ENCOUNTER — Encounter (HOSPITAL_COMMUNITY): Payer: Self-pay

## 2020-06-12 ENCOUNTER — Other Ambulatory Visit: Payer: Self-pay

## 2020-06-12 DIAGNOSIS — Z01818 Encounter for other preprocedural examination: Secondary | ICD-10-CM | POA: Insufficient documentation

## 2020-06-12 HISTORY — DX: Malignant (primary) neoplasm, unspecified: C80.1

## 2020-06-12 LAB — BASIC METABOLIC PANEL WITH GFR
Anion gap: 8 (ref 5–15)
BUN: 13 mg/dL (ref 8–23)
CO2: 29 mmol/L (ref 22–32)
Calcium: 8.9 mg/dL (ref 8.9–10.3)
Chloride: 102 mmol/L (ref 98–111)
Creatinine, Ser: 1.15 mg/dL (ref 0.61–1.24)
GFR, Estimated: >60 mL/min
Glucose, Bld: 120 mg/dL — ABNORMAL HIGH (ref 70–99)
Potassium: 2.9 mmol/L — ABNORMAL LOW (ref 3.5–5.1)
Sodium: 139 mmol/L (ref 135–145)

## 2020-06-12 LAB — CBC
HCT: 31.6 % — ABNORMAL LOW (ref 39.0–52.0)
Hemoglobin: 9.5 g/dL — ABNORMAL LOW (ref 13.0–17.0)
MCH: 20.9 pg — ABNORMAL LOW (ref 26.0–34.0)
MCHC: 30.1 g/dL (ref 30.0–36.0)
MCV: 69.5 fL — ABNORMAL LOW (ref 80.0–100.0)
Platelets: 597 10*3/uL — ABNORMAL HIGH (ref 150–400)
RBC: 4.55 MIL/uL (ref 4.22–5.81)
RDW: 21.8 % — ABNORMAL HIGH (ref 11.5–15.5)
WBC: 8.5 10*3/uL (ref 4.0–10.5)
nRBC: 0 % (ref 0.0–0.2)

## 2020-06-12 NOTE — Progress Notes (Signed)
Pt. Refused blood products. 

## 2020-06-13 LAB — NO BLOOD PRODUCTS

## 2020-06-13 LAB — CEA: CEA: 6.1 ng/mL — ABNORMAL HIGH (ref 0.0–4.7)

## 2020-06-13 NOTE — Progress Notes (Signed)
Labs results: Hemoglobin: 9.5. Potassium: 2.9

## 2020-06-16 DIAGNOSIS — Z01818 Encounter for other preprocedural examination: Secondary | ICD-10-CM | POA: Diagnosis not present

## 2020-06-17 ENCOUNTER — Other Ambulatory Visit (HOSPITAL_COMMUNITY)
Admission: RE | Admit: 2020-06-17 | Discharge: 2020-06-17 | Disposition: A | Discharge status: Home / Self Care | Payer: Medicare Other | Source: Ambulatory Visit | Attending: General Surgery | Admitting: General Surgery

## 2020-06-17 DIAGNOSIS — Z01812 Encounter for preprocedural laboratory examination: Secondary | ICD-10-CM | POA: Insufficient documentation

## 2020-06-17 DIAGNOSIS — Z20822 Contact with and (suspected) exposure to covid-19: Secondary | ICD-10-CM | POA: Insufficient documentation

## 2020-06-17 LAB — SARS CORONAVIRUS 2 (TAT 6-24 HRS): SARS Coronavirus 2: NEGATIVE

## 2020-06-19 MED ORDER — BUPIVACAINE LIPOSOME 1.3 % IJ SUSP
20.0000 mL | Freq: Once | INTRAMUSCULAR | Status: DC
  Filled 2020-06-19: qty 20

## 2020-06-20 ENCOUNTER — Encounter (HOSPITAL_COMMUNITY)
Admission: RE | Disposition: A | Discharge status: Home / Self Care | Payer: Self-pay | Source: Home / Self Care | Attending: General Surgery

## 2020-06-20 ENCOUNTER — Other Ambulatory Visit: Payer: Self-pay

## 2020-06-20 ENCOUNTER — Encounter (HOSPITAL_COMMUNITY): Payer: Self-pay | Admitting: General Surgery

## 2020-06-20 ENCOUNTER — Inpatient Hospital Stay (HOSPITAL_COMMUNITY)
Admission: RE | Admit: 2020-06-20 | Discharge: 2020-06-24 | DRG: 330 | Disposition: A | Discharge status: Home / Self Care | Payer: Medicare Other | Attending: General Surgery | Admitting: General Surgery

## 2020-06-20 ENCOUNTER — Inpatient Hospital Stay (HOSPITAL_COMMUNITY): Payer: Medicare Other | Admitting: Certified Registered"

## 2020-06-20 DIAGNOSIS — Z20822 Contact with and (suspected) exposure to covid-19: Secondary | ICD-10-CM | POA: Diagnosis present

## 2020-06-20 DIAGNOSIS — G4700 Insomnia, unspecified: Secondary | ICD-10-CM | POA: Diagnosis present

## 2020-06-20 DIAGNOSIS — F32A Depression, unspecified: Secondary | ICD-10-CM | POA: Diagnosis present

## 2020-06-20 DIAGNOSIS — Z791 Long term (current) use of non-steroidal anti-inflammatories (NSAID): Secondary | ICD-10-CM | POA: Diagnosis not present

## 2020-06-20 DIAGNOSIS — D62 Acute posthemorrhagic anemia: Secondary | ICD-10-CM | POA: Diagnosis not present

## 2020-06-20 DIAGNOSIS — Z87891 Personal history of nicotine dependence: Secondary | ICD-10-CM | POA: Diagnosis not present

## 2020-06-20 DIAGNOSIS — Z800 Family history of malignant neoplasm of digestive organs: Secondary | ICD-10-CM | POA: Diagnosis not present

## 2020-06-20 DIAGNOSIS — Z6836 Body mass index (BMI) 36.0-36.9, adult: Secondary | ICD-10-CM | POA: Diagnosis not present

## 2020-06-20 DIAGNOSIS — E876 Hypokalemia: Secondary | ICD-10-CM | POA: Diagnosis present

## 2020-06-20 DIAGNOSIS — Z531 Procedure and treatment not carried out because of patient's decision for reasons of belief and group pressure: Secondary | ICD-10-CM | POA: Diagnosis present

## 2020-06-20 DIAGNOSIS — G8929 Other chronic pain: Secondary | ICD-10-CM | POA: Diagnosis present

## 2020-06-20 DIAGNOSIS — I10 Essential (primary) hypertension: Secondary | ICD-10-CM | POA: Diagnosis present

## 2020-06-20 DIAGNOSIS — D638 Anemia in other chronic diseases classified elsewhere: Secondary | ICD-10-CM | POA: Diagnosis present

## 2020-06-20 DIAGNOSIS — C187 Malignant neoplasm of sigmoid colon: Principal | ICD-10-CM | POA: Diagnosis present

## 2020-06-20 DIAGNOSIS — E669 Obesity, unspecified: Secondary | ICD-10-CM | POA: Diagnosis present

## 2020-06-20 DIAGNOSIS — Z79899 Other long term (current) drug therapy: Secondary | ICD-10-CM | POA: Diagnosis not present

## 2020-06-20 DIAGNOSIS — R739 Hyperglycemia, unspecified: Secondary | ICD-10-CM | POA: Diagnosis present

## 2020-06-20 DIAGNOSIS — M545 Low back pain, unspecified: Secondary | ICD-10-CM | POA: Diagnosis present

## 2020-06-20 HISTORY — PX: LAPAROSCOPIC PARTIAL COLECTOMY: SHX5907

## 2020-06-20 SURGERY — LAPAROSCOPIC PARTIAL COLECTOMY
Anesthesia: General

## 2020-06-20 MED ORDER — DEXAMETHASONE SODIUM PHOSPHATE 10 MG/ML IJ SOLN
INTRAMUSCULAR | Status: AC
  Filled 2020-06-20: qty 1

## 2020-06-20 MED ORDER — PHENYLEPHRINE HCL-NACL 10-0.9 MG/250ML-% IV SOLN
INTRAVENOUS | Status: DC | PRN
  Administered 2020-06-20: 10 ug/min via INTRAVENOUS

## 2020-06-20 MED ORDER — PROMETHAZINE HCL 25 MG/ML IJ SOLN: 6.2500 mg | INTRAMUSCULAR | Status: DC | PRN

## 2020-06-20 MED ORDER — ACETAMINOPHEN 500 MG PO TABS
1000.0000 mg | ORAL_TABLET | ORAL | Status: AC
  Administered 2020-06-20: 1000 mg via ORAL
  Filled 2020-06-20: qty 2

## 2020-06-20 MED ORDER — KCL IN DEXTROSE-NACL 20-5-0.45 MEQ/L-%-% IV SOLN
INTRAVENOUS | Status: DC
  Filled 2020-06-20 (×2): qty 1000

## 2020-06-20 MED ORDER — PHENYLEPHRINE HCL (PRESSORS) 10 MG/ML IV SOLN
INTRAVENOUS | Status: DC | PRN
  Administered 2020-06-20: 80 ug via INTRAVENOUS
  Administered 2020-06-20: 120 ug via INTRAVENOUS
  Administered 2020-06-20: 80 ug via INTRAVENOUS
  Administered 2020-06-20: 120 ug via INTRAVENOUS
  Administered 2020-06-20: 80 ug via INTRAVENOUS
  Administered 2020-06-20: 50 ug via INTRAVENOUS
  Administered 2020-06-20: 100 ug via INTRAVENOUS

## 2020-06-20 MED ORDER — HYDROMORPHONE HCL 1 MG/ML IJ SOLN
INTRAMUSCULAR | Status: DC | PRN
  Administered 2020-06-20 (×2): .5 mg via INTRAVENOUS

## 2020-06-20 MED ORDER — LOSARTAN POTASSIUM 50 MG PO TABS
100.0000 mg | ORAL_TABLET | Freq: Every day | ORAL | Status: DC
  Administered 2020-06-22 – 2020-06-24 (×3): 100 mg via ORAL
  Filled 2020-06-20 (×3): qty 2

## 2020-06-20 MED ORDER — LIDOCAINE 2% (20 MG/ML) 5 ML SYRINGE
INTRAMUSCULAR | Status: AC
  Filled 2020-06-20: qty 5

## 2020-06-20 MED ORDER — GABAPENTIN 300 MG PO CAPS
300.0000 mg | ORAL_CAPSULE | Freq: Two times a day (BID) | ORAL | Status: DC
  Administered 2020-06-20 – 2020-06-24 (×8): 300 mg via ORAL
  Filled 2020-06-20 (×8): qty 1

## 2020-06-20 MED ORDER — BUPIVACAINE-EPINEPHRINE (PF) 0.25% -1:200000 IJ SOLN
INTRAMUSCULAR | Status: AC
  Filled 2020-06-20: qty 30

## 2020-06-20 MED ORDER — ROCURONIUM BROMIDE 100 MG/10ML IV SOLN
INTRAVENOUS | Status: DC | PRN
  Administered 2020-06-20 (×2): 10 mg via INTRAVENOUS
  Administered 2020-06-20: 50 mg via INTRAVENOUS
  Administered 2020-06-20 (×3): 10 mg via INTRAVENOUS

## 2020-06-20 MED ORDER — AMLODIPINE BESYLATE 10 MG PO TABS
10.0000 mg | ORAL_TABLET | Freq: Every day | ORAL | Status: DC
  Administered 2020-06-20 – 2020-06-24 (×5): 10 mg via ORAL
  Filled 2020-06-20 (×5): qty 1

## 2020-06-20 MED ORDER — MIDAZOLAM HCL 5 MG/5ML IJ SOLN
INTRAMUSCULAR | Status: DC | PRN
  Administered 2020-06-20: 2 mg via INTRAVENOUS

## 2020-06-20 MED ORDER — ALVIMOPAN 12 MG PO CAPS
12.0000 mg | ORAL_CAPSULE | Freq: Two times a day (BID) | ORAL | Status: DC
  Administered 2020-06-21: 12 mg via ORAL

## 2020-06-20 MED ORDER — BUPIVACAINE LIPOSOME 1.3 % IJ SUSP
INTRAMUSCULAR | Status: DC | PRN
  Administered 2020-06-20: 20 mL

## 2020-06-20 MED ORDER — ORAL CARE MOUTH RINSE: 15.0000 mL | Freq: Once | OROMUCOSAL | Status: AC

## 2020-06-20 MED ORDER — SIMETHICONE 80 MG PO CHEW: 40.0000 mg | CHEWABLE_TABLET | Freq: Four times a day (QID) | ORAL | Status: DC | PRN

## 2020-06-20 MED ORDER — HYDROMORPHONE HCL 1 MG/ML IJ SOLN
0.5000 mg | INTRAMUSCULAR | Status: DC | PRN
  Administered 2020-06-20 – 2020-06-22 (×5): 0.5 mg via INTRAVENOUS
  Filled 2020-06-20 (×6): qty 0.5

## 2020-06-20 MED ORDER — OXYCODONE HCL 5 MG PO TABS: 5.0000 mg | ORAL_TABLET | Freq: Once | ORAL | Status: DC | PRN

## 2020-06-20 MED ORDER — DIPHENHYDRAMINE HCL 50 MG/ML IJ SOLN: 12.5000 mg | Freq: Four times a day (QID) | INTRAMUSCULAR | Status: DC | PRN

## 2020-06-20 MED ORDER — ONDANSETRON HCL 4 MG PO TABS: 4.0000 mg | ORAL_TABLET | Freq: Four times a day (QID) | ORAL | Status: DC | PRN

## 2020-06-20 MED ORDER — LACTATED RINGERS IV SOLN: INTRAVENOUS | Status: DC

## 2020-06-20 MED ORDER — PROPOFOL 10 MG/ML IV BOLUS
INTRAVENOUS | Status: DC | PRN
  Administered 2020-06-20 (×2): 20 mg via INTRAVENOUS
  Administered 2020-06-20: 60 mg via INTRAVENOUS
  Administered 2020-06-20: 40 mg via INTRAVENOUS
  Administered 2020-06-20: 60 mg via INTRAVENOUS

## 2020-06-20 MED ORDER — SODIUM CHLORIDE 0.9 % IR SOLN
Status: DC | PRN
  Administered 2020-06-20: 2000 mL

## 2020-06-20 MED ORDER — PHENYLEPHRINE HCL (PRESSORS) 10 MG/ML IV SOLN
INTRAVENOUS | Status: AC
  Filled 2020-06-20: qty 1

## 2020-06-20 MED ORDER — ENOXAPARIN SODIUM 40 MG/0.4ML ~~LOC~~ SOLN
40.0000 mg | SUBCUTANEOUS | Status: DC
  Administered 2020-06-21 – 2020-06-24 (×4): 40 mg via SUBCUTANEOUS
  Filled 2020-06-20 (×4): qty 0.4

## 2020-06-20 MED ORDER — SUGAMMADEX SODIUM 200 MG/2ML IV SOLN
INTRAVENOUS | Status: DC | PRN
  Administered 2020-06-20 (×2): 100 mg via INTRAVENOUS

## 2020-06-20 MED ORDER — HYDROMORPHONE HCL 2 MG/ML IJ SOLN
INTRAMUSCULAR | Status: AC
  Filled 2020-06-20: qty 1

## 2020-06-20 MED ORDER — CHLORHEXIDINE GLUCONATE 0.12 % MT SOLN
15.0000 mL | Freq: Once | OROMUCOSAL | Status: AC
  Administered 2020-06-20: 15 mL via OROMUCOSAL

## 2020-06-20 MED ORDER — DIPHENHYDRAMINE HCL 12.5 MG/5ML PO ELIX
12.5000 mg | ORAL_SOLUTION | Freq: Four times a day (QID) | ORAL | Status: DC | PRN
  Administered 2020-06-20 – 2020-06-23 (×4): 12.5 mg via ORAL
  Filled 2020-06-20 (×4): qty 5

## 2020-06-20 MED ORDER — HEPARIN SODIUM (PORCINE) 5000 UNIT/ML IJ SOLN
5000.0000 [IU] | Freq: Once | INTRAMUSCULAR | Status: AC
  Administered 2020-06-20: 5000 [IU] via SUBCUTANEOUS
  Filled 2020-06-20: qty 1

## 2020-06-20 MED ORDER — BUPIVACAINE-EPINEPHRINE 0.25% -1:200000 IJ SOLN
INTRAMUSCULAR | Status: DC | PRN
  Administered 2020-06-20: 30 mL

## 2020-06-20 MED ORDER — PHENYLEPHRINE 40 MCG/ML (10ML) SYRINGE FOR IV PUSH (FOR BLOOD PRESSURE SUPPORT)
PREFILLED_SYRINGE | INTRAVENOUS | Status: AC
  Filled 2020-06-20: qty 10

## 2020-06-20 MED ORDER — LACTATED RINGERS IV SOLN: INTRAVENOUS | Status: DC | PRN

## 2020-06-20 MED ORDER — ALUM & MAG HYDROXIDE-SIMETH 200-200-20 MG/5ML PO SUSP: 30.0000 mL | Freq: Four times a day (QID) | ORAL | Status: DC | PRN

## 2020-06-20 MED ORDER — OXYCODONE HCL 5 MG/5ML PO SOLN: 5.0000 mg | Freq: Once | ORAL | Status: DC | PRN

## 2020-06-20 MED ORDER — FENTANYL CITRATE (PF) 100 MCG/2ML IJ SOLN
INTRAMUSCULAR | Status: DC | PRN
  Administered 2020-06-20: 100 ug via INTRAVENOUS
  Administered 2020-06-20: 50 ug via INTRAVENOUS
  Administered 2020-06-20: 100 ug via INTRAVENOUS
  Administered 2020-06-20 (×2): 50 ug via INTRAVENOUS

## 2020-06-20 MED ORDER — GABAPENTIN 300 MG PO CAPS
300.0000 mg | ORAL_CAPSULE | ORAL | Status: AC
  Administered 2020-06-20: 300 mg via ORAL
  Filled 2020-06-20: qty 1

## 2020-06-20 MED ORDER — DEXAMETHASONE SODIUM PHOSPHATE 10 MG/ML IJ SOLN
INTRAMUSCULAR | Status: DC | PRN
  Administered 2020-06-20: 10 mg via INTRAVENOUS

## 2020-06-20 MED ORDER — ACETAMINOPHEN 500 MG PO TABS
1000.0000 mg | ORAL_TABLET | Freq: Four times a day (QID) | ORAL | Status: DC
  Administered 2020-06-20 – 2020-06-24 (×11): 1000 mg via ORAL
  Filled 2020-06-20 (×11): qty 2

## 2020-06-20 MED ORDER — FENTANYL CITRATE (PF) 100 MCG/2ML IJ SOLN
INTRAMUSCULAR | Status: AC
  Filled 2020-06-20: qty 2

## 2020-06-20 MED ORDER — ONDANSETRON HCL 4 MG/2ML IJ SOLN
INTRAMUSCULAR | Status: AC
  Filled 2020-06-20: qty 2

## 2020-06-20 MED ORDER — SODIUM CHLORIDE 0.9 % IV SOLN
2.0000 g | INTRAVENOUS | Status: AC
  Administered 2020-06-20: 2 g via INTRAVENOUS
  Filled 2020-06-20: qty 2

## 2020-06-20 MED ORDER — HYDROCHLOROTHIAZIDE 12.5 MG PO CAPS
12.5000 mg | ORAL_CAPSULE | Freq: Every day | ORAL | Status: DC
  Administered 2020-06-21 – 2020-06-24 (×4): 12.5 mg via ORAL
  Filled 2020-06-20 (×6): qty 1

## 2020-06-20 MED ORDER — ALVIMOPAN 12 MG PO CAPS
12.0000 mg | ORAL_CAPSULE | ORAL | Status: AC
  Administered 2020-06-20: 12 mg via ORAL
  Filled 2020-06-20: qty 1

## 2020-06-20 MED ORDER — PROPOFOL 10 MG/ML IV BOLUS
INTRAVENOUS | Status: AC
  Filled 2020-06-20: qty 20

## 2020-06-20 MED ORDER — DEXMEDETOMIDINE (PRECEDEX) IN NS 20 MCG/5ML (4 MCG/ML) IV SYRINGE
PREFILLED_SYRINGE | INTRAVENOUS | Status: DC | PRN
  Administered 2020-06-20 (×2): 4 ug via INTRAVENOUS

## 2020-06-20 MED ORDER — GLYCOPYRROLATE PF 0.2 MG/ML IJ SOSY
PREFILLED_SYRINGE | INTRAMUSCULAR | Status: AC
  Filled 2020-06-20: qty 1

## 2020-06-20 MED ORDER — ESMOLOL HCL 100 MG/10ML IV SOLN
INTRAVENOUS | Status: AC
  Filled 2020-06-20: qty 10

## 2020-06-20 MED ORDER — SACCHAROMYCES BOULARDII 250 MG PO CAPS
250.0000 mg | ORAL_CAPSULE | Freq: Two times a day (BID) | ORAL | Status: DC
  Administered 2020-06-20 – 2020-06-24 (×8): 250 mg via ORAL
  Filled 2020-06-20 (×8): qty 1

## 2020-06-20 MED ORDER — FENTANYL CITRATE (PF) 250 MCG/5ML IJ SOLN
INTRAMUSCULAR | Status: AC
  Filled 2020-06-20: qty 5

## 2020-06-20 MED ORDER — ONDANSETRON HCL 4 MG/2ML IJ SOLN
INTRAMUSCULAR | Status: DC | PRN
  Administered 2020-06-20: 4 mg via INTRAVENOUS

## 2020-06-20 MED ORDER — MIDAZOLAM HCL 2 MG/2ML IJ SOLN
INTRAMUSCULAR | Status: AC
  Filled 2020-06-20: qty 2

## 2020-06-20 MED ORDER — LIDOCAINE HCL (CARDIAC) PF 100 MG/5ML IV SOSY
PREFILLED_SYRINGE | INTRAVENOUS | Status: DC | PRN
  Administered 2020-06-20: 100 mg via INTRAVENOUS

## 2020-06-20 MED ORDER — ENSURE PRE-SURGERY PO LIQD
592.0000 mL | Freq: Once | ORAL | Status: DC
  Filled 2020-06-20: qty 592

## 2020-06-20 MED ORDER — DEXMEDETOMIDINE (PRECEDEX) IN NS 20 MCG/5ML (4 MCG/ML) IV SYRINGE
PREFILLED_SYRINGE | INTRAVENOUS | Status: AC
  Filled 2020-06-20: qty 5

## 2020-06-20 MED ORDER — FENTANYL CITRATE (PF) 100 MCG/2ML IJ SOLN: 25.0000 ug | INTRAMUSCULAR | Status: DC | PRN

## 2020-06-20 MED ORDER — LACTATED RINGERS IR SOLN
Status: DC | PRN
  Administered 2020-06-20: 1000 mL

## 2020-06-20 MED ORDER — ENSURE PRE-SURGERY PO LIQD
296.0000 mL | Freq: Once | ORAL | Status: DC
  Filled 2020-06-20: qty 296

## 2020-06-20 MED ORDER — ONDANSETRON HCL 4 MG/2ML IJ SOLN: 4.0000 mg | Freq: Four times a day (QID) | INTRAMUSCULAR | Status: DC | PRN

## 2020-06-20 MED ORDER — SODIUM CHLORIDE 0.9 % IV SOLN
2.0000 g | Freq: Two times a day (BID) | INTRAVENOUS | Status: AC
  Administered 2020-06-20: 2 g via INTRAVENOUS
  Filled 2020-06-20: qty 2

## 2020-06-20 SURGICAL SUPPLY — 76 items
APPLIER CLIP 5 13 M/L LIGAMAX5 (MISCELLANEOUS)
BLADE EXTENDED COATED 6.5IN (ELECTRODE) IMPLANT
CABLE HIGH FREQUENCY MONO STRZ (ELECTRODE) IMPLANT
CELLS DAT CNTRL 66122 CELL SVR (MISCELLANEOUS) IMPLANT
CHLORAPREP W/TINT 26 (MISCELLANEOUS) ×2 IMPLANT
CLIP APPLIE 5 13 M/L LIGAMAX5 (MISCELLANEOUS) IMPLANT
COVER WAND RF STERILE (DRAPES) ×2 IMPLANT
DECANTER SPIKE VIAL GLASS SM (MISCELLANEOUS) ×2 IMPLANT
DERMABOND ADVANCED (GAUZE/BANDAGES/DRESSINGS) ×1
DERMABOND ADVANCED .7 DNX12 (GAUZE/BANDAGES/DRESSINGS) ×1 IMPLANT
DRAIN CHANNEL 19F RND (DRAIN) IMPLANT
DRAPE LAPAROSCOPIC ABDOMINAL (DRAPES) ×2 IMPLANT
DRAPE SURG IRRIG POUCH 19X23 (DRAPES) ×2 IMPLANT
DRSG OPSITE POSTOP 4X10 (GAUZE/BANDAGES/DRESSINGS) IMPLANT
DRSG OPSITE POSTOP 4X6 (GAUZE/BANDAGES/DRESSINGS) ×1 IMPLANT
DRSG OPSITE POSTOP 4X8 (GAUZE/BANDAGES/DRESSINGS) IMPLANT
DRSG TEGADERM 2-3/8X2-3/4 SM (GAUZE/BANDAGES/DRESSINGS) ×1 IMPLANT
ELECT REM PT RETURN 15FT ADLT (MISCELLANEOUS) ×2 IMPLANT
EVACUATOR SILICONE 100CC (DRAIN) IMPLANT
GAUZE SPONGE 4X4 12PLY STRL (GAUZE/BANDAGES/DRESSINGS) IMPLANT
GLOVE SURG ENC MOIS LTX SZ6.5 (GLOVE) ×4 IMPLANT
GLOVE SURG UNDER POLY LF SZ7 (GLOVE) ×4 IMPLANT
GOWN STRL REUS W/TWL XL LVL3 (GOWN DISPOSABLE) ×12 IMPLANT
GRASPER ENDOPATH ANVIL 10MM (MISCELLANEOUS) IMPLANT
HOLDER FOLEY CATH W/STRAP (MISCELLANEOUS) ×2 IMPLANT
IRRIG SUCT STRYKERFLOW 2 WTIP (MISCELLANEOUS) ×2
IRRIGATION SUCT STRKRFLW 2 WTP (MISCELLANEOUS) ×1 IMPLANT
KIT TURNOVER KIT A (KITS) ×2 IMPLANT
PACK COLON (CUSTOM PROCEDURE TRAY) ×2 IMPLANT
PAD POSITIONING PINK XL (MISCELLANEOUS) ×2 IMPLANT
PENCIL SMOKE EVACUATOR (MISCELLANEOUS) IMPLANT
PORT LAP GEL ALEXIS MED 5-9CM (MISCELLANEOUS) ×2 IMPLANT
PROTECTOR NERVE ULNAR (MISCELLANEOUS) ×2 IMPLANT
RELOAD PROXIMATE 75MM BLUE (ENDOMECHANICALS) ×4 IMPLANT
RELOAD PROXIMATE TA60MM BLUE (ENDOMECHANICALS) ×2 IMPLANT
RELOAD STAPLE 60 BLU REG PROX (ENDOMECHANICALS) IMPLANT
RELOAD STAPLE 75 3.8 BLU REG (ENDOMECHANICALS) IMPLANT
RETRACTOR WND ALEXIS 18 MED (MISCELLANEOUS) IMPLANT
RETRACTOR WND ALEXIS 25 LRG (MISCELLANEOUS) IMPLANT
RTRCTR WOUND ALEXIS 18CM MED (MISCELLANEOUS)
RTRCTR WOUND ALEXIS 25CM LRG (MISCELLANEOUS) ×2
SCISSORS LAP 5X35 DISP (ENDOMECHANICALS) ×2 IMPLANT
SEALER TISSUE G2 STRG ARTC 35C (ENDOMECHANICALS) ×2 IMPLANT
SET TUBE SMOKE EVAC HIGH FLOW (TUBING) ×2 IMPLANT
SLEEVE XCEL OPT CAN 5 100 (ENDOMECHANICALS) ×2 IMPLANT
SPONGE DRAIN TRACH 4X4 STRL 2S (GAUZE/BANDAGES/DRESSINGS) IMPLANT
SPONGE GAUZE 2X2 8PLY STRL LF (GAUZE/BANDAGES/DRESSINGS) ×1 IMPLANT
SPONGE LAP 18X18 RF (DISPOSABLE) IMPLANT
STAPLER CUT RELOAD BLUE (STAPLE) ×2 IMPLANT
STAPLER CVD CUT GN 40 RELOAD (ENDOMECHANICALS) ×2 IMPLANT
STAPLER CVD CUT GRN 40 RELOAD (ENDOMECHANICALS) IMPLANT
STAPLER ECHELON POWER CIR 31 (STAPLE) ×1 IMPLANT
STAPLER GUN LINEAR PROX 60 (STAPLE) ×1 IMPLANT
STAPLER PROXIMATE 75MM BLUE (STAPLE) ×1 IMPLANT
SURGILUBE 2OZ TUBE FLIPTOP (MISCELLANEOUS) ×2 IMPLANT
SUT ETHILON 2 0 PS N (SUTURE) IMPLANT
SUT NOVA NAB DX-16 0-1 5-0 T12 (SUTURE) ×4 IMPLANT
SUT PDS AB 1 TP1 54 (SUTURE) ×2 IMPLANT
SUT PROLENE 2 0 KS (SUTURE) ×1 IMPLANT
SUT SILK 2 0 (SUTURE) ×2
SUT SILK 2 0 SH CR/8 (SUTURE) ×1 IMPLANT
SUT SILK 2-0 18XBRD TIE 12 (SUTURE) ×1 IMPLANT
SUT SILK 3 0 (SUTURE)
SUT SILK 3 0 SH CR/8 (SUTURE) ×3 IMPLANT
SUT SILK 3-0 18XBRD TIE 12 (SUTURE) IMPLANT
SUT VIC AB 2-0 SH 18 (SUTURE) ×2 IMPLANT
SUT VIC AB 4-0 PS2 27 (SUTURE) ×2 IMPLANT
SUT VICRYL 0 UR6 27IN ABS (SUTURE) ×2 IMPLANT
SYS LAPSCP GELPORT 120MM (MISCELLANEOUS)
SYSTEM LAPSCP GELPORT 120MM (MISCELLANEOUS) IMPLANT
TOWEL OR NON WOVEN STRL DISP B (DISPOSABLE) ×2 IMPLANT
TRAY FOLEY MTR SLVR 16FR STAT (SET/KITS/TRAYS/PACK) IMPLANT
TROCAR BLADELESS OPT 5 100 (ENDOMECHANICALS) ×2 IMPLANT
TROCAR XCEL BLUNT TIP 100MML (ENDOMECHANICALS) IMPLANT
TUBING CONNECTING 10 (TUBING) ×2 IMPLANT
YANKAUER SUCT BULB TIP 10FT TU (MISCELLANEOUS) ×1 IMPLANT

## 2020-06-20 NOTE — Anesthesia Postprocedure Evaluation (Signed)
Anesthesia Post Note  Patient: Eric Macdonald  Procedure(s) Performed: LAPAROSCOPIC PARTIAL COLECTOMY (N/A )     Patient location during evaluation: PACU Anesthesia Type: General Level of consciousness: awake and alert Pain management: pain level controlled Vital Signs Assessment: post-procedure vital signs reviewed and stable Respiratory status: spontaneous breathing, nonlabored ventilation, respiratory function stable and patient connected to nasal cannula oxygen Cardiovascular status: blood pressure returned to baseline and stable Postop Assessment: no apparent nausea or vomiting Anesthetic complications: no   No complications documented.  Last Vitals:  Vitals:   06/20/20 1630 06/20/20 1657  BP: 118/82 127/77  Pulse: (!) 103 100  Resp: 20 15  Temp: 36.4 C 36.6 C  SpO2: 100% 100%    Last Pain:  Vitals:   06/20/20 1701  TempSrc:   PainSc: 5                  Shanaya Schneck COKER

## 2020-06-20 NOTE — Interval H&P Note (Deleted)
History and Physical Interval Note:  06/20/2020 10:16 AM  Eric Macdonald  has presented today for surgery, with the diagnosis of COLON CANCER.  The various methods of treatment have been discussed with the patient and family. After consideration of risks, benefits and other options for treatment, the patient has consented to  Procedure(s): LAPAROSCOPIC PARTIAL COLECTOMY (N/A) as a surgical intervention.  The patient's history has been reviewed, patient examined, no change in status, stable for surgery.  I have reviewed the patient's chart and labs.  Questions were answered to the patient's satisfaction.     Rosario Adie, MD  [facility] [place] [facility]

## 2020-06-20 NOTE — Op Note (Signed)
06/20/2020  3:40 PM  PATIENT:  Eric Macdonald  65 y.o. male  Patient Care Team: Kristie Cowman, MD as PCP - General (Family Medicine)  PRE-OPERATIVE DIAGNOSIS:  COLON CANCER  POST-OPERATIVE DIAGNOSIS:  COLON CANCER  PROCEDURE:   LAPAROSCOPIC PARTIAL COLECTOMY, SMALL BOWEL RESECTION   Surgeon(s): Leighton Ruff, MD Clovis Riley, MD Wonda Cerise, MD  ASSISTANT: Dr Kae Heller, MD   ANESTHESIA:   local and general  EBL: 258ml  Total I/O In: 2100 [I.V.:2000; IV Piggyback:100] Out: 31 [Urine:400; Blood:250]  DRAINS: none   SPECIMEN:  Source of Specimen:  sigmoid colon and associated small bowel  DISPOSITION OF SPECIMEN:  PATHOLOGY  COUNTS:  YES  PLAN OF CARE: Discharge to home after PACU  PATIENT DISPOSITION:  PACU - hemodynamically stable.  INDICATION: 65 y.o. M with partially obstructing colon cancer   OR FINDINGS: Large sigmoid colon mass with associated ileum, no signs of metastatic disease.  There was inflammatory studding on the retroperitoneal bed on the right side.  DESCRIPTION: the patient was identified in the preoperative holding area and taken to the OR where they were laid supine on the operating room table.  General anesthesia was induced without difficulty. SCDs were also noted to be in place prior to the initiation of anesthesia.  A Foley catheter was placed under sterile conditions.  The patient was then placed in lithotomy position.  The patient was then prepped and draped in the usual sterile fashion.   A surgical timeout was performed indicating the correct patient, procedure, positioning and need for preoperative antibiotics.   I began by making a small infraumbilical incision and dissecting down to the level of the fascia.  The fascia was incised using electrocautery and the edges were elevated.  The peritoneum was entered bluntly.  2 stay sutures were placed into the fascia and a Hassan port was placed into the abdomen.  Insufflation was created.  A  camera was inserted and a large tumor was identified in the lower abdomen with small bowel intimately adherent to this tumor on the right side.  There was no sign of metastatic disease to the liver or peritoneal surfaces.  The mass was approximately 10 cm proximal to the rectosigmoid junction.  We began by mobilizing the left colon off of the left sidewall using the Enseal device.  This was taken up to the level of the splenic flexure.  The retroperitoneal plane was bluntly dissected.  This mobilized the proximal colon to easily reach to the pelvis.  Due to the size of the tumor, I was unable to dissect any further laparoscopically.  A small lower midline incision was made and an [place] wound protector was placed.  The colon tumor was identified.  We began by dividing the small bowel proximally and distally using a 75 mm blue load stapler.  The mesentery of that small bowel was then divided using the Enseal device.  I then identified the rectosigmoid junction and opened the mesenteric plane just underneath of this and bluntly dissected out this plane.  I then used a green load contour device to divide the rectosigmoid junction.  I opened up the peritoneal planes on either side using electrocautery.  I dissected down into the left pelvis to identify the left ureter.  This was not well visualized but appeared to be in its normal anatomic position.  I continued dissecting above the retroperitoneal plane using the Enseal device to divide the colon mesentery back to the level of the previous dissection  of the lateral sidewall.  I then divided the proximal colon approximately 5 cm proximal to the mass using a blue load contour stapler.  Once this was completed I began to bluntly mobilize the posterior portion of the tumor.  There was one portion of the tumor that was densely adherent to the retroperitoneum through a thick band.  There was felt to be a blood vessel within this.  We divided this over a Kelly clamp at the  level of the mass.  A 2-0 suture was placed to ligate the vessel.  I then divided my IMA pedicle using blunt dissection and layer by layer division using the Enseal device.  Once we were down to only artery and vein the Enseal device was used to divide these.  We then divided the rectal mesentery using the Enseal device.  This allowed the tumor to be free from the abdomen.  The specimen was marked and sent to pathology for further examination.  The abdomen was irrigated.  Hemostasis was good.  There was some inflammatory studding noted along the right retroperitoneal surface but no obvious tumor was visualized.  The remainder of the abdominal contents appeared well perfused.  We did divided an additional portion of proximal sigmoid colon to allow for it to lie straight into the pelvis.  This was done over a pursestring device.  The mesentery was divided using the Enseal device.  A 31 mm EEA anvil was then placed into the open end of the remaining colon.  The colon appeared well perfused at the mucosal level.  The anvil was secured with a 2-0 Prolene pursestring suture.  This was then placed into the abdomen.  The EEA stapler was inserted into the rectum and brought out just anterior to the staple line.  An anastomosis was created.  There was no tension or twisting of the colon.  There was no leak when tested with insufflation under irrigation.  We then identified the 2 portions of small bowel and created an end-to-end anastomosis using a 75 mm GIA stapler.  The common enterotomy channel was closed using a 60 mm TA stapler.  The mesenteric defect was closed using interrupted 2-0 silk sutures.  This was then placed back into the right lower quadrant.  The abdomen was then irrigated with 1 L of warm normal saline and then 1 L of sterile water.  Hemostasis was good.  The omentum was brought down over the abdominal contents and the [place] wound protector was removed.  We then switched to clean gowns, gloves,  instruments and drapes.  The fascia was closed using 2 running #1 PDS sutures.  The subcutaneous tissue was reapproximated using a running 2-0 Vicryl suture.  The skin was closed with staples.  The port sites were also closed with staples.  A sterile dressing was applied.  The patient was then awakened from anesthesia and sent to the postanesthesia care unit in stable condition.  All counts were correct per operating room staff.

## 2020-06-20 NOTE — Anesthesia Procedure Notes (Signed)
Procedure Name: Intubation Date/Time: 06/20/2020 1:02 PM Performed by: Adalberto Ill, CRNA Pre-anesthesia Checklist: Emergency Drugs available, Suction available, Patient being monitored, Timeout performed and Patient identified Patient Re-evaluated:Patient Re-evaluated prior to induction Oxygen Delivery Method: Circle system utilized Preoxygenation: Pre-oxygenation with 100% oxygen Induction Type: IV induction Ventilation: Mask ventilation without difficulty Laryngoscope Size: Miller and 2 Grade View: Grade I Tube type: Oral Tube size: 7.5 mm Number of attempts: 1 (brief atraumatic ) Airway Equipment and Method: Stylet Placement Confirmation: ETT inserted through vocal cords under direct vision,  positive ETCO2 and breath sounds checked- equal and bilateral Secured at: 23 cm Tube secured with: Tape Dental Injury: Teeth and Oropharynx as per pre-operative assessment

## 2020-06-20 NOTE — Anesthesia Preprocedure Evaluation (Addendum)
Anesthesia Evaluation  Patient identified by MRN, date of birth, ID band Patient awake    Reviewed: Allergy & Precautions, NPO status , Patient's Chart, lab work & pertinent test results  History of Anesthesia Complications Negative for: history of anesthetic complications  Airway Mallampati: II  TM Distance: >3 FB Neck ROM: Full    Dental  (+) Missing, Edentulous Upper,    Pulmonary former smoker,    Pulmonary exam normal        Cardiovascular hypertension, Pt. on medications Normal cardiovascular exam     Neuro/Psych negative neurological ROS  negative psych ROS   GI/Hepatic Neg liver ROS, Colon ca   Endo/Other  negative endocrine ROS  Renal/GU negative Renal ROS  negative genitourinary   Musculoskeletal  (+) Arthritis ,   Abdominal   Peds  Hematology  (+) anemia , REFUSES BLOOD PRODUCTS, Hgb 9.5   Anesthesia Other Findings Day of surgery medications reviewed with patient.  Reproductive/Obstetrics                            Anesthesia Physical Anesthesia Plan  ASA: III  Anesthesia Plan: General   Post-op Pain Management:    Induction: Intravenous  PONV Risk Score and Plan: 3 and Treatment may vary due to age or medical condition, Ondansetron and Dexamethasone  Airway Management Planned: Oral ETT  Additional Equipment: None  Intra-op Plan:   Post-operative Plan: Extubation in OR  Informed Consent: I have reviewed the patients History and Physical, chart, labs and discussed the procedure including the risks, benefits and alternatives for the proposed anesthesia with the patient or authorized representative who has indicated his/her understanding and acceptance.     Dental advisory given  Plan Discussed with: CRNA  Anesthesia Plan Comments: (Discussed blood product refusal with patient in preop. Patient declines all blood products including albumin, even in event of  death or permanent disability. Daiva Huge, MD)      Anesthesia Quick Evaluation

## 2020-06-20 NOTE — Transfer of Care (Signed)
Immediate Anesthesia Transfer of Care Note  Patient: Eric Macdonald  Procedure(s) Performed: LAPAROSCOPIC PARTIAL COLECTOMY (N/A )  Patient Location: PACU  Anesthesia Type:General  Level of Consciousness: awake, alert , oriented and patient cooperative  Airway & Oxygen Therapy: Patient Spontanous Breathing and Patient connected to face mask oxygen  Post-op Assessment: Report given to RN and Post -op Vital signs reviewed and stable  Post vital signs: Reviewed and stable  Last Vitals:  Vitals Value Taken Time  BP 144/100 06/20/20 1600  Temp 36.4 C 06/20/20 1600  Pulse 110 06/20/20 1605  Resp 22 06/20/20 1605  SpO2 100 % 06/20/20 1605  Vitals shown include unvalidated device data.  Last Pain:  Vitals:   06/20/20 1600  TempSrc:   PainSc: 0-No pain      Patients Stated Pain Goal: 3 (38/87/19 5974)  Complications: No complications documented.

## 2020-06-21 ENCOUNTER — Encounter (HOSPITAL_COMMUNITY): Payer: Self-pay | Admitting: General Surgery

## 2020-06-21 DIAGNOSIS — Z531 Procedure and treatment not carried out because of patient's decision for reasons of belief and group pressure: Secondary | ICD-10-CM

## 2020-06-21 DIAGNOSIS — G8929 Other chronic pain: Secondary | ICD-10-CM | POA: Diagnosis present

## 2020-06-21 DIAGNOSIS — E876 Hypokalemia: Secondary | ICD-10-CM

## 2020-06-21 DIAGNOSIS — I10 Essential (primary) hypertension: Secondary | ICD-10-CM | POA: Diagnosis present

## 2020-06-21 LAB — BASIC METABOLIC PANEL
Anion gap: 11 (ref 5–15)
BUN: 8 mg/dL (ref 8–23)
CO2: 26 mmol/L (ref 22–32)
Calcium: 8 mg/dL — ABNORMAL LOW (ref 8.9–10.3)
Chloride: 98 mmol/L (ref 98–111)
Creatinine, Ser: 1.15 mg/dL (ref 0.61–1.24)
GFR, Estimated: >60 mL/min (ref >60)
Glucose, Bld: 165 mg/dL — ABNORMAL HIGH (ref 70–99)
Potassium: 3.2 mmol/L — ABNORMAL LOW (ref 3.5–5.1)
Sodium: 135 mmol/L (ref 135–145)

## 2020-06-21 LAB — PREALBUMIN: Prealbumin: 8.1 mg/dL — ABNORMAL LOW (ref 18–38)

## 2020-06-21 LAB — PHOSPHORUS: Phosphorus: 3.5 mg/dL (ref 2.5–4.6)

## 2020-06-21 LAB — MAGNESIUM: Magnesium: 1.8 mg/dL (ref 1.7–2.4)

## 2020-06-21 MED ORDER — MENTHOL 3 MG MT LOZG: 1.0000 | LOZENGE | OROMUCOSAL | Status: DC | PRN

## 2020-06-21 MED ORDER — SODIUM CHLORIDE 0.9% FLUSH: 3.0000 mL | INTRAVENOUS | Status: DC | PRN

## 2020-06-21 MED ORDER — LIP MEDEX EX OINT
1.0000 "application " | TOPICAL_OINTMENT | Freq: Two times a day (BID) | CUTANEOUS | Status: DC
  Administered 2020-06-21 – 2020-06-24 (×7): 1 via TOPICAL
  Filled 2020-06-21: qty 7

## 2020-06-21 MED ORDER — POTASSIUM CHLORIDE CRYS ER 20 MEQ PO TBCR
40.0000 meq | EXTENDED_RELEASE_TABLET | Freq: Two times a day (BID) | ORAL | Status: DC
  Administered 2020-06-21 (×2): 40 meq via ORAL
  Filled 2020-06-21 (×2): qty 2

## 2020-06-21 MED ORDER — SODIUM CHLORIDE 0.9% FLUSH
3.0000 mL | Freq: Two times a day (BID) | INTRAVENOUS | Status: DC
  Administered 2020-06-21 – 2020-06-23 (×5): 3 mL via INTRAVENOUS

## 2020-06-21 MED ORDER — METHOCARBAMOL 500 MG PO TABS
1000.0000 mg | ORAL_TABLET | Freq: Four times a day (QID) | ORAL | Status: DC | PRN
  Administered 2020-06-22 – 2020-06-23 (×3): 1000 mg via ORAL
  Filled 2020-06-21 (×3): qty 2

## 2020-06-21 MED ORDER — LACTATED RINGERS IV BOLUS: 1000.0000 mL | Freq: Three times a day (TID) | INTRAVENOUS | Status: DC | PRN

## 2020-06-21 MED ORDER — METHOCARBAMOL 1000 MG/10ML IJ SOLN
1000.0000 mg | Freq: Four times a day (QID) | INTRAVENOUS | Status: DC | PRN
  Filled 2020-06-21: qty 10

## 2020-06-21 MED ORDER — MAGNESIUM SULFATE 2 GM/50ML IV SOLN
2.0000 g | Freq: Once | INTRAVENOUS | Status: AC
  Administered 2020-06-21: 2 g via INTRAVENOUS
  Filled 2020-06-21: qty 50

## 2020-06-21 MED ORDER — CALCIUM POLYCARBOPHIL 625 MG PO TABS
625.0000 mg | ORAL_TABLET | Freq: Two times a day (BID) | ORAL | Status: DC
  Administered 2020-06-21 – 2020-06-24 (×7): 625 mg via ORAL
  Filled 2020-06-21 (×8): qty 1

## 2020-06-21 MED ORDER — SODIUM CHLORIDE 0.9 % IV SOLN: 250.0000 mL | INTRAVENOUS | Status: DC | PRN

## 2020-06-21 NOTE — Progress Notes (Signed)
Eric Macdonald [id number] 06/01/1955  CARE TEAM:  PCP: Kristie Cowman, MD  Outpatient Care Team: Patient Care Team: Kristie Cowman, MD as PCP - General (Family Medicine)  Inpatient Treatment Team: Treatment Team: Attending Provider: Leighton Ruff, MD; Technician: Leda Quail, NT; Registered Nurse: Dorinda Hill, RN; Utilization Review: Claudie Leach, RN   Problem List:   Principal Problem:   Cancer of sigmoid colon [facility]) Active Problems:   Depression   Insomnia   Hypokalemia   Transfusion of blood product refused for religious reason   Hypertension   Chronic low back pain   1 Day Post-Op  06/20/2020  POST-OPERATIVE DIAGNOSIS:  COLON CANCER  PROCEDURE:   LAPAROSCOPIC SIGMOID COLECTOMY SMALL BOWEL RESECTION  SURGEON: Leighton Ruff, MD   Assessment  Recovering  [facility] Stay = 1 days)  Plan:  -ERAS pathway -Adv diet -ML IVF w PRN backup boluses -hypokalemia - correct & & check mag -d/c foley - he wants out & w/o prostate issue.  Walking well -f/u pathology -HTN control - amlodipine, HTCZ -mild hyperglycemia - follow -VTE prophylaxis- SCDs, etc -mobilize as tolerated to help recovery  Disposition:  Disposition:  The patient is from: Home  Anticipate discharge to:  Home  Anticipated Date of Discharge is:  April 11,2022    Barriers to discharge:  Pending Clinical improvement (more likely than not)  Patient currently is NOT MEDICALLY STABLE for discharge from the hospital from a surgery standpoint.      20 minutes spent in review, evaluation, examination, counseling, and coordination of care.   I have reviewed this patient's available data, including medical history, events of note, physical examination and test results as part of my evaluation.  A significant portion of that time was spent in counseling.  Care during the described time interval was provided by me.  06/21/2020    Subjective: (Chief complaint)  Pain  controlled Having flatus and bowel movements.  Hungry.  Walked in hallways.  Would like to get Foley catheter out.  Denies any history of nocturia or prostate problems  Objective:  Vital signs:  Vitals:   06/20/20 1849 06/20/20 2004 06/21/20 0126 06/21/20 0501  BP: (!) 115/95 (!) 135/98 113/79 130/84  Pulse: 98 (!) 103 94 99  Resp: 18 16 16 16   Temp: 97.7 F (36.5 C) 97.7 F (36.5 C) 98.5 F (36.9 C) (!) 97.4 F (36.3 C)  TempSrc: Oral Oral Oral Oral  SpO2: 94% 96% 94% 94%  Weight:      Height:        Last BM Date: 06/21/20  Intake/Output   Yesterday:  04/08 0701 - 04/09 0700 In: 2215 [P.O.:45; I.V.:2070; IV Piggyback:100] Out: 2850 [Urine:2600; Blood:250] This shift:  No intake/output data recorded.  Bowel function:  Flatus: YES  BM:  YES  Drain: (No drain)   Physical Exam:  General: Pt awake/alert in no acute distress Eyes: PERRL, normal EOM.  Sclera clear.  No icterus Neuro: CN II-XII intact w/o focal sensory/motor deficits. Lymph: No head/neck/groin lymphadenopathy Psych:  No delerium/psychosis/paranoia.  Oriented x 4 HENT: Normocephalic, Mucus membranes moist.  No thrush Neck: Supple, No tracheal deviation.  No obvious thyromegaly Chest: No pain to chest wall compression.  Good respiratory excursion.  No audible wheezing CV:  Pulses intact.  Regular rhythm.  No major extremity edema MS: Normal AROM mjr joints.  No obvious deformity  Abdomen: Soft.  Mildy distended.  Nontender.  No evidence of peritonitis.  No incarcerated hernias.  Ext:   No deformity.  No mjr edema.  No cyanosis Skin: No petechiae / purpurea.  No major sores.  Warm and dry    Results:   Cultures: Recent Results (from the past 720 hour(s))  SARS CORONAVIRUS 2 (TAT 6-24 HRS) Nasopharyngeal Nasopharyngeal Swab     Status: None   Collection Time: 06/17/20 12:56 PM   Specimen: Nasopharyngeal Swab  Result Value Ref Range Status   SARS Coronavirus 2 NEGATIVE NEGATIVE Final     Comment: (NOTE) SARS-CoV-2 target nucleic acids are NOT DETECTED.  The SARS-CoV-2 RNA is generally detectable in upper and lower respiratory specimens during the acute phase of infection. Negative results do not preclude SARS-CoV-2 infection, do not rule out co-infections with other pathogens, and should not be used as the sole basis for treatment or other patient management decisions. Negative results must be combined with clinical observations, patient history, and epidemiological information. The expected result is Negative.  Fact Sheet for Patients: SugarRoll.be  Fact Sheet for Healthcare Providers: https://www.woods-mathews.com/  This test is not yet approved or cleared by the [place] FDA and  has been authorized for detection and/or diagnosis of SARS-CoV-2 by FDA under an Emergency Use Authorization (EUA). This EUA will remain  in effect (meaning this test can be used) for the duration of the COVID-19 declaration under Se ction 564(b)(1) of the Act, 21 U.S.C. section 360bbb-3(b)(1), unless the authorization is terminated or revoked sooner.  Performed at [facility], [place] [address] [place], [place] [address]     Labs: Results for orders placed or performed during the hospital encounter of 06/20/20 (from the past 48 hour(s))  Basic metabolic panel     Status: Abnormal   Collection Time: 06/21/20  4:28 AM  Result Value Ref Range   Sodium 135 135 - 145 mmol/L   Potassium 3.2 (L) 3.5 - 5.1 mmol/L   Chloride 98 98 - 111 mmol/L   CO2 26 22 - 32 mmol/L   Glucose, Bld 165 (H) 70 - 99 mg/dL    Comment: Glucose reference range applies only to samples taken after fasting for at least 8 hours.   BUN 8 8 - 23 mg/dL   Creatinine, Ser 1.15 0.61 - 1.24 mg/dL   Calcium 8.0 (L) 8.9 - 10.3 mg/dL   GFR, Estimated >60 >60 mL/min    Comment: (NOTE) Calculated using the CKD-EPI Creatinine Equation (2021)    Anion gap 11 5 - 15     Comment: Performed at [facility], [place] [address] [place], [place] [address]    Imaging / Studies: No results found.  Medications / Allergies: per chart  Antibiotics: Anti-infectives (From admission, onward)   Start     Dose/Rate Route Frequency Ordered Stop   06/20/20 2200  cefoTEtan (CEFOTAN) 2 g in sodium chloride 0.9 % 100 mL IVPB        2 g 200 mL/hr over 30 Minutes Intravenous Every 12 hours 06/20/20 1700 06/20/20 2308   06/20/20 1015  cefoTEtan (CEFOTAN) 2 g in sodium chloride 0.9 % 100 mL IVPB        2 g 200 mL/hr over 30 Minutes Intravenous On call to O.R. 06/20/20 1001 06/20/20 1333        Note: Portions of this report may have been transcribed using voice recognition software. Every effort was made to ensure accuracy; however, inadvertent computerized transcription errors may be present.   Any transcriptional errors that result from this process are unintentional.    Adin Hector, MD,  FACS, MASCRS  Esophageal, Gastrointestinal & Colorectal Surgery Robotic and Minimally Invasive Surgery [facility] [address] [place], [place] [address] [phone] Fax [phone] Main/Paging  CONTACT INFORMATION: Weekday (9AM-5PM) concerns: Call [facility] main office at [phone] Weeknight (5PM-9AM) or Weekend/Holiday concerns: Check [email or web address] for General Surgery [facility] coverage (Please, do not use SecureChat as it is not reliable communication to operating surgeons for immediate patient care)      06/21/2020  8:35 AM

## 2020-06-22 LAB — BASIC METABOLIC PANEL
Anion gap: 10 (ref 5–15)
BUN: 7 mg/dL — ABNORMAL LOW (ref 8–23)
CO2: 29 mmol/L (ref 22–32)
Calcium: 8.1 mg/dL — ABNORMAL LOW (ref 8.9–10.3)
Chloride: 102 mmol/L (ref 98–111)
Creatinine, Ser: 1.07 mg/dL (ref 0.61–1.24)
GFR, Estimated: >60 mL/min (ref >60)
Glucose, Bld: 109 mg/dL — ABNORMAL HIGH (ref 70–99)
Potassium: 3.4 mmol/L — ABNORMAL LOW (ref 3.5–5.1)
Sodium: 141 mmol/L (ref 135–145)

## 2020-06-22 LAB — HEMOGLOBIN: Hemoglobin: 7.9 g/dL — ABNORMAL LOW (ref 13.0–17.0)

## 2020-06-22 LAB — FOLATE: Folate: 3.5 ng/mL — ABNORMAL LOW (ref >5.9)

## 2020-06-22 LAB — IRON AND TIBC
Iron: 34 ug/dL — ABNORMAL LOW (ref 45–182)
Saturation Ratios: 24 % (ref 17.9–39.5)
TIBC: 144 ug/dL — ABNORMAL LOW (ref 250–450)
UIBC: 110 ug/dL

## 2020-06-22 LAB — RETICULOCYTES
Immature Retic Fract: 22.4 % — ABNORMAL HIGH (ref 2.3–15.9)
RBC.: 3.76 MIL/uL — ABNORMAL LOW (ref 4.22–5.81)
Retic Count, Absolute: 42.1 10*3/uL (ref 19.0–186.0)
Retic Ct Pct: 1.1 % (ref 0.4–3.1)

## 2020-06-22 LAB — VITAMIN B12: Vitamin B-12: 103 pg/mL — ABNORMAL LOW (ref 180–914)

## 2020-06-22 LAB — FERRITIN: Ferritin: 125 ng/mL (ref 24–336)

## 2020-06-22 MED ORDER — POTASSIUM CHLORIDE 20 MEQ PO PACK
40.0000 meq | PACK | Freq: Once | ORAL | Status: AC
  Administered 2020-06-22: 40 meq via ORAL
  Filled 2020-06-22: qty 2

## 2020-06-22 MED ORDER — BOOST / RESOURCE BREEZE PO LIQD CUSTOM
1.0000 | ORAL | Status: DC
  Administered 2020-06-24: 1 via ORAL

## 2020-06-22 MED ORDER — ENSURE SURGERY PO LIQD
237.0000 mL | Freq: Two times a day (BID) | ORAL | Status: DC
  Administered 2020-06-24: 237 mL via ORAL

## 2020-06-22 MED ORDER — ADULT MULTIVITAMIN W/MINERALS CH
1.0000 | ORAL_TABLET | Freq: Every day | ORAL | Status: DC
  Administered 2020-06-22 – 2020-06-24 (×3): 1 via ORAL
  Filled 2020-06-22 (×3): qty 1

## 2020-06-22 MED ORDER — PROSOURCE PLUS PO LIQD
30.0000 mL | Freq: Every day | ORAL | Status: DC
  Administered 2020-06-22 – 2020-06-23 (×2): 30 mL via ORAL
  Filled 2020-06-22 (×2): qty 30

## 2020-06-22 NOTE — Progress Notes (Signed)
2 Days Post-Op   Subjective/Chief Complaint: Having flatus and bms, pain controlled, ambulatingm, voiding, tol regular diet last night   Objective: Vital signs in last 24 hours: Temp:  [97.7 F (36.5 C)-98.7 F (37.1 C)] 98.7 F (37.1 C) (04/10 0504) Pulse Rate:  [89-103] 96 (04/10 0504) Resp:  [17-19] 17 (04/10 0504) BP: (115-144)/(73-94) 144/94 (04/10 0504) SpO2:  [91 %-97 %] 93 % (04/10 0504) Last BM Date: 06/21/20  Intake/Output from previous day: 04/09 0701 - 04/10 0700 In: 1830 [P.O.:1430; I.V.:300; IV Piggyback:100] Out: 2350 [Urine:2350] Intake/Output this shift: No intake/output data recorded.  General: nad cv rrr Lungs clear Ab soft nondistended, approp tender dressing dry  Lab Results:  Recent Labs    06/22/20 0443  HGB 7.9*   BMET Recent Labs    06/21/20 0428 06/22/20 0443  NA 135 141  K 3.2* 3.4*  CL 98 102  CO2 26 29  GLUCOSE 165* 109*  BUN 8 7*  CREATININE 1.15 1.07  CALCIUM 8.0* 8.1*   PT/INR No results for input(s): LABPROT, INR in the last 72 hours. ABG No results for input(s): PHART, HCO3 in the last 72 hours.  Invalid input(s): PCO2, PO2  Studies/Results: No results found.  Anti-infectives: Anti-infectives (From admission, onward)   Start     Dose/Rate Route Frequency Ordered Stop   06/20/20 2200  cefoTEtan (CEFOTAN) 2 g in sodium chloride 0.9 % 100 mL IVPB        2 g 200 mL/hr over 30 Minutes Intravenous Every 12 hours 06/20/20 1700 06/20/20 2308   06/20/20 1015  cefoTEtan (CEFOTAN) 2 g in sodium chloride 0.9 % 100 mL IVPB        2 g 200 mL/hr over 30 Minutes Intravenous On call to O.R. 06/20/20 1001 06/20/20 1333      Assessment/Plan: POD 2 LAR/ileal resection -having bowel function, tol regular diet will continue -K low again, will replace and then now as on regular diet stop checking labs -had hb checked and is low at 7.9, will not recheck as he does not want blood products and minimize lab draws -pulm toilet,  ambulation -possibly home in am if continues to do well  Rolm Bookbinder 06/22/2020

## 2020-06-22 NOTE — Progress Notes (Signed)
Initial Nutrition Assessment  RD working remotely.  DOCUMENTATION CODES:   Obesity unspecified  INTERVENTION:  - will order Ensure Surgery BID, each supplement provides 330 kcal and 18 grams protein. - will order Boost Breeze once/day, each supplement provides 250 kcal and 9 grams of protein. - will order order 30 ml Prosource Plus once/day, each supplement provides 100 kcal and 15 grams protein.  - will order 1 tablet multivitamin with minerals/day. - complete NFPE when feasible.    NUTRITION DIAGNOSIS:   Increased nutrient needs related to cancer and cancer related treatments,post-op healing as evidenced by estimated needs.  GOAL:   Patient will meet greater than or equal to 90% of their needs  MONITOR:   PO intake,Supplement acceptance,Labs,Weight trends  REASON FOR ASSESSMENT:   Malnutrition Screening Tool  ASSESSMENT:   65 year-old male with medical history of HTN, arthritis, chronic low back pain, and colon cancer. He presented to the hospital for planned lap colectomy.  Diet advanced from NPO to CLD on 4/9 at 0850 and to Soft yesterday at 1642. The only documented meal completion was 100% of dinner last night.   Patient is POD #2 lap partial colectomy, SBR.  He has not been seen by a [facility] RD at any time in the past.  Weight on 4/8 was 263 lb which is exactly the same as weight recorded on 06/12/20; suspected it was copied forward. Prior to 06/12/20, the most recently documented weight was 07/23/13.   Labs reviewed; K: 3.4 mmol/l, BUN: 7 mg/dl, Ca: 8.1 mg/dl. Medications reviewed; 625 mg fibercon BID, 40 mEq Klor-Con x1 dose 4/10, 250 mg florastor BID.    NUTRITION - FOCUSED PHYSICAL EXAM:  unable to complete at this time.   Diet Order:   Diet Order            DIET SOFT Room service appropriate? Yes; Fluid consistency: Thin  Diet effective now                 EDUCATION NEEDS:   Not appropriate for education at this time  Skin:  Skin  Assessment: Skin Integrity Issues: Skin Integrity Issues:: Incisions Incisions: abdomen (4/8)  Last BM:  PTA/unknown  Height:   Ht Readings from Last 1 Encounters:  06/20/20 5' 11.5" (1.816 m)    Weight:   Wt Readings from Last 1 Encounters:  06/20/20 119.3 kg    Estimated Nutritional Needs:  Kcal:  2300-2500 kcal Protein:  115-130 grams Fluid:  >/= 2.5 L/day      Jarome Matin, MS, RD, LDN, CNSC Inpatient Clinical Dietitian RD pager # available in [place]  After hours/weekend pager # available in [facility]

## 2020-06-23 MED ORDER — ACETAMINOPHEN 500 MG PO TABS: 1000.0000 mg | ORAL_TABLET | Freq: Four times a day (QID) | ORAL | 0 refills | Status: DC

## 2020-06-23 MED ORDER — TRAMADOL HCL 50 MG PO TABS: 50.0000 mg | ORAL_TABLET | Freq: Four times a day (QID) | ORAL | Status: DC | PRN

## 2020-06-23 MED ORDER — HYDROMORPHONE HCL 1 MG/ML IJ SOLN: 0.5000 mg | INTRAMUSCULAR | Status: DC | PRN

## 2020-06-23 NOTE — Care Management Important Message (Signed)
Important Message  Patient Details IM Letter given to the Patient. Name: Eric Macdonald MRN: [id number] Date of Birth: 07/31/1955   Medicare Important Message Given:  Yes     Kerin Salen 06/23/2020, 2:58 PM

## 2020-06-23 NOTE — Discharge Instructions (Signed)

## 2020-06-23 NOTE — Progress Notes (Signed)
3 Days Post-Op   Subjective/Chief Complaint: Having flatus and bms, pain controlled, ambulatingm, voiding, tol regular diet    Objective: Vital signs in last 24 hours: Temp:  [98.3 F (36.8 C)-98.5 F (36.9 C)] 98.3 F (36.8 C) (04/11 0411) Pulse Rate:  [97-106] 106 (04/11 0411) Resp:  [18] 18 (04/11 0411) BP: (112-138)/(87-94) 138/94 (04/11 0411) SpO2:  [96 %-97 %] 96 % (04/11 0411) Weight:  [932 kg] 119 kg (04/11 0452) Last BM Date: 06/23/20  Intake/Output from previous day: 04/10 0701 - 04/11 0700 In: 1023 [P.O.:1020; I.V.:3] Out: 300 [Urine:300] Intake/Output this shift: No intake/output data recorded.  General: nad cv rrr Lungs clear Ab soft nondistended, approp tender  Inc: dry  Lab Results:  Recent Labs    06/22/20 0443  HGB 7.9*   BMET Recent Labs    06/21/20 0428 06/22/20 0443  NA 135 141  K 3.2* 3.4*  CL 98 102  CO2 26 29  GLUCOSE 165* 109*  BUN 8 7*  CREATININE 1.15 1.07  CALCIUM 8.0* 8.1*   PT/INR No results for input(s): LABPROT, INR in the last 72 hours. ABG No results for input(s): PHART, HCO3 in the last 72 hours.  Invalid input(s): PCO2, PO2  Studies/Results: No results found.  Anti-infectives: Anti-infectives (From admission, onward)   Start     Dose/Rate Route Frequency Ordered Stop   06/20/20 2200  cefoTEtan (CEFOTAN) 2 g in sodium chloride 0.9 % 100 mL IVPB        2 g 200 mL/hr over 30 Minutes Intravenous Every 12 hours 06/20/20 1700 06/20/20 2308   06/20/20 1015  cefoTEtan (CEFOTAN) 2 g in sodium chloride 0.9 % 100 mL IVPB        2 g 200 mL/hr over 30 Minutes Intravenous On call to O.R. 06/20/20 1001 06/20/20 1333      Assessment/Plan: POD 3 Sigmoidectomy, ileal resection -having bowel function, tol regular diet -will continue -chronic and acute blood loss anemia: hb  7.9, will not recheck as he does not want blood products and minimize lab draws -pulm toilet, ambulation - PO pain meds -home in am if continues to do  well  Rosario Adie 3/55/7322

## 2020-06-24 MED ORDER — TRAMADOL HCL 50 MG PO TABS: 50.0000 mg | ORAL_TABLET | Freq: Four times a day (QID) | ORAL | 0 refills | Status: DC | PRN

## 2020-06-24 NOTE — Discharge Summary (Signed)
Physician Discharge Summary  Patient ID: Eric Macdonald MRN: [id number] DOB/AGE: 66-May-1957 66 y.o.  Admit date: 06/20/2020 Discharge date: 06/24/2020  Admission Diagnoses: Colon cancer Discharge Diagnoses:  Principal Problem:   Cancer of sigmoid colon [facility]) Active Problems:   Depression   Insomnia   Hypokalemia   Transfusion of blood product refused for religious reason   Hypertension   Chronic low back pain   Discharged Condition: good  Hospital Course: Patient was admitted to the floor after laparoscopic assisted sigmoidectomy and small bowel resection.  His diet was advanced as tolerated.  By postop day 4 he was tolerating a regular diet and having bowel function.  His pain was controlled with oral medications.  He was felt to be in stable condition for discharge to home.  Consults: None  Significant Diagnostic Studies: labs: CBC and chemistry  Treatments: IV hydration, analgesia: Tramadol and surgery: See above  Discharge Exam: Blood pressure 120/89, pulse 97, temperature 98.3 F (36.8 C), temperature source Oral, resp. rate 18, height 5' 11.5" (1.816 m), weight 119 kg, SpO2 97 %. General appearance: alert and cooperative GI: Soft, appropriately tender at incision site Incision/Wound: Clean dry and intact  Disposition: Discharge disposition: 01-Home or Self Care        Allergies as of 06/24/2020      Reactions   Other    Blood products Refusal       Medication List    TAKE these medications   acetaminophen 500 MG tablet Commonly known as: TYLENOL Take 2 tablets (1,000 mg total) by mouth every 6 (six) hours.   amLODipine 10 MG tablet Commonly known as: NORVASC Take 10 mg by mouth daily.   hydrochlorothiazide 12.5 MG capsule Commonly known as: MICROZIDE Take 12.5 mg by mouth daily.   losartan 100 MG tablet Commonly known as: COZAAR Take 100 mg by mouth daily.   traMADol 50 MG tablet Commonly known as: ULTRAM Take 1-2 tablets (50-100 mg total)  by mouth every 6 (six) hours as needed for moderate pain or severe pain.       Follow-up [facility], [place]. Go on 06/30/2020.   Specialty: General Surgery Why: be here at 10 am for staple removal Contact information: [place] [address] [phone]       Leighton Ruff, MD Follow up on 07/08/2020.   Specialties: General Surgery, Colon and Rectal Surgery Why: be here at 9:15 for a 9:30 am apt Contact information: [address] [place] [address] [phone]               Signed: Rosario Adie 2/63/3354, 8:29 AM

## 2020-06-24 NOTE — Progress Notes (Signed)
   06/24/20 0735  Clinical Encounter Type  Visited With Patient  Visit Type Initial;Spiritual support;Social support  Referral From Chaplain  Consult/Referral To [employer] met PT while doing rounds.  PT stated, he believed he was in good hands. Chaplain supported PT in that belief. Chaplain ministered with a calming presence. Chaplain gave emotional and spiritual support.

## 2020-06-24 NOTE — Progress Notes (Signed)
Discharge instructions given to patient and all questions were answered.  

## 2020-06-26 LAB — SURGICAL PATHOLOGY

## 2020-07-02 ENCOUNTER — Other Ambulatory Visit: Payer: Self-pay

## 2020-07-10 ENCOUNTER — Telehealth: Payer: Self-pay | Admitting: Nurse Practitioner

## 2020-07-10 NOTE — Telephone Encounter (Signed)
Received a new pt referral from Dr. Marcello Moores for colon cancer. Eric Macdonald has been cld and scheduled to see [name] on 5/9 at 11am. Pt aware to arrive 20 minutes early.

## 2020-07-21 ENCOUNTER — Inpatient Hospital Stay: Payer: Medicare Other | Attending: Nurse Practitioner | Admitting: Nurse Practitioner

## 2020-07-21 ENCOUNTER — Other Ambulatory Visit: Payer: Self-pay

## 2020-07-21 ENCOUNTER — Encounter: Payer: Self-pay | Admitting: Nurse Practitioner

## 2020-07-21 ENCOUNTER — Inpatient Hospital Stay: Payer: Medicare Other

## 2020-07-21 VITALS — BP 145/96 | HR 104 | Temp 98.2°F | Resp 13 | Ht 71.5 in | Wt 276.6 lb

## 2020-07-21 DIAGNOSIS — C187 Malignant neoplasm of sigmoid colon: Secondary | ICD-10-CM

## 2020-07-21 DIAGNOSIS — E538 Deficiency of other specified B group vitamins: Secondary | ICD-10-CM | POA: Insufficient documentation

## 2020-07-21 DIAGNOSIS — Z87891 Personal history of nicotine dependence: Secondary | ICD-10-CM | POA: Diagnosis not present

## 2020-07-21 DIAGNOSIS — K921 Melena: Secondary | ICD-10-CM | POA: Diagnosis not present

## 2020-07-21 DIAGNOSIS — D519 Vitamin B12 deficiency anemia, unspecified: Secondary | ICD-10-CM

## 2020-07-21 DIAGNOSIS — I10 Essential (primary) hypertension: Secondary | ICD-10-CM | POA: Insufficient documentation

## 2020-07-21 DIAGNOSIS — D75839 Thrombocytosis, unspecified: Secondary | ICD-10-CM | POA: Diagnosis not present

## 2020-07-21 DIAGNOSIS — R197 Diarrhea, unspecified: Secondary | ICD-10-CM | POA: Diagnosis not present

## 2020-07-21 DIAGNOSIS — D649 Anemia, unspecified: Secondary | ICD-10-CM | POA: Diagnosis not present

## 2020-07-21 LAB — FOLATE: Folate: 6 ng/mL (ref >5.9)

## 2020-07-21 LAB — CMP (CANCER CENTER ONLY)
ALT: 33 U/L (ref 0–44)
AST: 25 U/L (ref 15–41)
Albumin: 3.5 g/dL (ref 3.5–5.0)
Alkaline Phosphatase: 64 U/L (ref 38–126)
Anion gap: 11 (ref 5–15)
BUN: 8 mg/dL (ref 8–23)
CO2: 26 mmol/L (ref 22–32)
Calcium: 9.4 mg/dL (ref 8.9–10.3)
Chloride: 103 mmol/L (ref 98–111)
Creatinine: 1.09 mg/dL (ref 0.61–1.24)
GFR, Estimated: >60 mL/min (ref >60)
Glucose, Bld: 91 mg/dL (ref 70–99)
Potassium: 3.5 mmol/L (ref 3.5–5.1)
Sodium: 140 mmol/L (ref 135–145)
Total Bilirubin: 0.3 mg/dL (ref 0.3–1.2)
Total Protein: 8 g/dL (ref 6.5–8.1)

## 2020-07-21 LAB — CBC WITH DIFFERENTIAL (CANCER CENTER ONLY)
Abs Immature Granulocytes: 0.02 10*3/uL (ref 0.00–0.07)
Basophils Absolute: 0.1 10*3/uL (ref 0.0–0.1)
Basophils Relative: 1 %
Eosinophils Absolute: 0.2 10*3/uL (ref 0.0–0.5)
Eosinophils Relative: 3 %
HCT: 35.3 % — ABNORMAL LOW (ref 39.0–52.0)
Hemoglobin: 11.1 g/dL — ABNORMAL LOW (ref 13.0–17.0)
Immature Granulocytes: 0 %
Lymphocytes Relative: 37 %
Lymphs Abs: 2 10*3/uL (ref 0.7–4.0)
MCH: 22.9 pg — ABNORMAL LOW (ref 26.0–34.0)
MCHC: 31.4 g/dL (ref 30.0–36.0)
MCV: 72.9 fL — ABNORMAL LOW (ref 80.0–100.0)
Monocytes Absolute: 0.5 10*3/uL (ref 0.1–1.0)
Monocytes Relative: 9 %
Neutro Abs: 2.7 10*3/uL (ref 1.7–7.7)
Neutrophils Relative %: 50 %
Platelet Count: 375 10*3/uL (ref 150–400)
RBC: 4.84 MIL/uL (ref 4.22–5.81)
RDW: 24.8 % — ABNORMAL HIGH (ref 11.5–15.5)
WBC Count: 5.5 10*3/uL (ref 4.0–10.5)
nRBC: 0 % (ref 0.0–0.2)

## 2020-07-21 LAB — CEA (IN HOUSE-CHCC): CEA (CHCC-In House): <1 ng/mL (ref 0.00–5.00)

## 2020-07-21 LAB — VITAMIN B12: Vitamin B-12: 173 pg/mL — ABNORMAL LOW (ref 180–914)

## 2020-07-21 NOTE — Progress Notes (Addendum)
[place]  [phone] Fax:([phone]) [place] Note   Patient Care Team: Kristie Cowman, MD as PCP - General (Family Medicine) Alla Feeling, NP as Nurse Practitioner (Oncology) Truitt Merle, MD as Consulting Physician (Hematology and Oncology) 07/21/2020  CHIEF COMPLAINTS/PURPOSE OF CONSULTATION:  Colon cancer, referred by [facility] surgeon Dr. Leighton Ruff  SUMMARY OF ONCOLOGY HISTORY Oncology History  Cancer of sigmoid colon [facility])  05/15/2020 Procedure   Colonoscopy by Dr. [name]: obstructing mass found in the sigmoid colon located 35 cm from the anus.  Unable to advance past the mass.  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  No metastatic disease in the chest, abdomen, pelvis   06/12/2020 Tumor Marker   Baseline CEA 6.1   06/20/2020 Initial Diagnosis   Cancer of sigmoid colon ([place])   06/20/2020 Cancer Staging   Staging form: Colon and Rectum, AJCC 8th Edition - Pathologic stage from 06/20/2020: Stage IIA (pT3, pN0, cM0) - Signed by Alla Feeling, NP on 07/21/2020 Stage prefix: Initial diagnosis Total positive nodes: 0 Histologic grading system: 4 grade system Histologic grade (G): G2   06/20/2020 Definitive Surgery   Laparoscopic partial colectomy, small bowel resection by Dr. Marcello Moores   06/20/2020 Pathology Results   FINAL MICROSCOPIC DIAGNOSIS:   A. COLON, SIGMOID AND ATTACHED SMALL BOWEL, RESECTION:  -  Adenocarcinoma, moderately differentiated, 12.7 cm  -  No carcinoma identified in twenty-four lymph nodes (0/24)  -  Margins  uninvolved by carcinoma (0.2 cm; mesenteric margin)  -  Sessile serrated polyp  -  See oncology table and comment below   B. COLON, ADDITIONAL PROXIMAL MARGIN, RESECTION:  -  No residual adenocarcinoma identified  -  No carcinoma identified in three lymph nodes (0/3)   MMR normal, MSI-stable     HISTORY OF PRESENTING ILLNESS:  Eric Macdonald 65 y.o. male has medical history including HTN, HL and Jehovah's Witness is here because of newly diagnosed colon cancer. He presented to GI with 2-6 month history of fatigue, 20 lbs weight loss, and bowel change with diarrhea up to 10 BM per day that were initially bloody. He notes he could feel a large mass in right pelvis. Colonoscopy on 05/15/2020 showed obstructing mass in the sigmoid colon, the scope was unable to be advanced.  Path showed invasive moderately differentiated adenocarcinoma with ulceration arising from a tubulovillous adenoma with high grade dysplasia.  Staging CT 05/20/2020 showed a large exophytic mass arising from the sigmoid colon measuring 12.9 x 7.3 x 8.6 cm, otherwise negative for distant metastatic disease in the chest, abdomen, or pelvis.  Baseline CA mildly elevated at 6.1.  He also had mild-moderate anemia Hgb 9 work-up showed P59 and folic acid deficiencies, normal ferritin. He underwent laparoscopic partial colectomy and small bowel resection by Dr. Marcello Moores on 06/20/2020.  Path showed moderately differentiated adenocarcinoma spanning 12.7 cm, there was no carcinoma in 27 lymph nodes.  Margins were uninvolved.  There was no perforation, lymphovascular invasion, or perineural invasion.  This was staged pT3pN0, stage IIa colon cancer.  There was preserved expression of the major mismatch repair proteins,  MSI-stable.   Socially, he is divorced, lives alone and independent with all ADLs.  3 adult children are healthy, his 2 daughters and 1 sister are nurses.  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  He smoked cigarettes less than 1 pack/day x 4 years, quit 1988.  Denies illicit drug use.  He drinks alcohol 2 days/week.  Family history is significant for colon cancer in maternal grandmother and maternal uncle.  His sister and her 2 daughters have "colon issues."  He does not know much of his father's medical history.  [name] presents by himself today, he is recovering well from surgery, 75% back to baseline.  3 separate BMs per day, not black or bloody.  Denies abdominal pain although the incision is "sensitive."  He feels stronger each day.   MEDICAL HISTORY:  Past Medical History:  Diagnosis Date  . Arthritis   . Cancer ([place])    colon  . Chronic low back pain    Is supposed to have surgery soon at L4-5  . Hypertension   . Knee pain     SURGICAL HISTORY: Past Surgical History:  Procedure Laterality Date  . JOINT REPLACEMENT     knee 05/2011  . KNEE SURGERY  08   rt arthroscopy  . LAPAROSCOPIC PARTIAL COLECTOMY N/A 06/20/2020   Procedure: LAPAROSCOPIC PARTIAL COLECTOMY;  Surgeon: Leighton Ruff, MD;  Location: [facility];  Service: General;  Laterality: N/A;  . TOTAL KNEE ARTHROPLASTY  05/05/2011   Procedure: TOTAL KNEE ARTHROPLASTY;  Surgeon: Ninetta Lights, MD;  Location: [facility];  Service: Orthopedics;  Laterality: Right;  DR MURPHY WANTS 90 MINUTES FOR THIS CASE    SOCIAL HISTORY: Social History   Socioeconomic History  . Marital status: Divorced    Spouse name: Not on file  . Number of children: 3  . Years of education: Not on file  . Highest education level: Not on file  Occupational History  . Occupation: retired    Comment: owned [employer]  Tobacco Use  . Smoking status: Former Smoker    Packs/day: 0.50    Years: 4.00    Pack years: 2.00    Quit date: 04/27/1986    Years since quitting: 34.2  . Smokeless tobacco: Never Used  Vaping Use  . Vaping Use: Never used  Substance and Sexual Activity  .  Alcohol use: Yes    Comment: drinks alcohol 2 days per week   . Drug use: No  . Sexual activity: Yes  Other Topics Concern  . Not on file  Social History Narrative  . Not on file   Social Determinants of Health   Financial Resource Strain: Not on file  Food Insecurity: Not on file  Transportation Needs: Not on file  Physical Activity: Not on file  Stress: Not on file  Social Connections: Not on file  Intimate Partner Violence: Not on file    FAMILY HISTORY: Family History  Problem Relation Age of Onset  . Cancer Maternal Uncle        colon  . Cancer Maternal Grandmother        colon    ALLERGIES:  is allergic to other.  MEDICATIONS:  Current Outpatient Medications  Medication Sig Dispense Refill  . acetaminophen (TYLENOL) 500 MG tablet Take 2 tablets (1,000 mg total) by mouth every 6 (six) hours. 30 tablet 0  . amLODipine (NORVASC) 10 MG tablet Take 10  mg by mouth daily.    . hydrochlorothiazide (MICROZIDE) 12.5 MG capsule Take 12.5 mg by mouth daily.    Marland Kitchen losartan (COZAAR) 100 MG tablet Take 100 mg by mouth daily.    . traMADol (ULTRAM) 50 MG tablet Take 1-2 tablets (50-100 mg total) by mouth every 6 (six) hours as needed for moderate pain or severe pain. 30 tablet 0   No current facility-administered medications for this visit.    REVIEW OF SYSTEMS:   Constitutional: Denies fevers, chills or abnormal night sweats (+) fatigue (+) 20 pounds weight loss Eyes: Denies blurriness of vision, double vision or watery eyes Ears, nose, mouth, throat, and face: Denies mucositis or sore throat Respiratory: Denies cough, dyspnea or wheezes Cardiovascular: Denies palpitation, chest discomfort or lower extremity swelling Gastrointestinal:  Denies nausea,, constipation, heartburn (+) change in bowel habits (+) 2-35-monthhistory of diarrhea, bloody initially Skin: Denies abnormal skin rashes Lymphatics: Denies new lymphadenopathy or easy bruising Neurological:Denies numbness,  tingling or new weaknesses Behavioral/Psych: Mood is stable, no new changes  All other systems were reviewed with the patient and are negative.  PHYSICAL EXAMINATION: ECOG PERFORMANCE STATUS: 1 - Symptomatic but completely ambulatory  Vitals:   07/21/20 1041  BP: (!) 145/96  Pulse: (!) 104  Resp: 13  Temp: 98.2 F (36.8 C)  SpO2: 100%   Filed Weights   07/21/20 1041  Weight: 276 lb 9.6 oz (125.5 kg)    GENERAL:alert, no distress and comfortable SKIN: No rash EYES: sclera clear NECK: without mass LYMPH:  no palpable cervical or supraclavicular lymphadenopathy  LUNGS: clear with normal breathing effort HEART: regular rate & rhythm, trace bilateral lower extremity edema ABDOMEN:abdomen soft, non-tender and normal bowel sounds. Midline incision closed, well healed. Scar tissue noted Musculoskeletal:no cyanosis of digits and no clubbing  PSYCH: alert & oriented x 3 with fluent speech NEURO: no focal motor/sensory deficits  LABORATORY DATA:  I have reviewed the data as listed CBC Latest Ref Rng & Units 07/21/2020 06/22/2020 06/12/2020  WBC 4.0 - 10.5 K/uL 5.5 - 8.5  Hemoglobin 13.0 - 17.0 g/dL 11.1(L) 7.9(L) 9.5(L)  Hematocrit 39.0 - 52.0 % 35.3(L) - 31.6(L)  Platelets 150 - 400 K/uL 375 - 597(H)   CMP Latest Ref Rng & Units 06/22/2020 06/21/2020 06/12/2020  Glucose 70 - 99 mg/dL 109(H) 165(H) 120(H)  BUN 8 - 23 mg/dL 7(L) 8 13  Creatinine 0.61 - 1.24 mg/dL 1.07 1.15 1.15  Sodium 135 - 145 mmol/L 141 135 139  Potassium 3.5 - 5.1 mmol/L 3.4(L) 3.2(L) 2.9(L)  Chloride 98 - 111 mmol/L 102 98 102  CO2 22 - 32 mmol/L _0 Calcium 8.9 - 10.3 mg/dL 8.1(L) 8.0(L) 8.9  Total Protein 6.0 - 8.3 g/dL - - -  Total Bilirubin 0.3 - 1.2 mg/dL - - -  Alkaline Phos 39 - 117 U/L - - -  AST 0 - 37 U/L - - -  ALT 0 - 53 U/L - - -    RADIOGRAPHIC STUDIES: I have personally reviewed the radiological images as listed and agreed with the findings in the report. No results  found.  ASSESSMENT & PLAN: 65yo male   1.  Adenocarcinoma of the sigmoid colon, moderately differentiated G2, pT3pN0M0 stage IIA, MMR normal, MSI-stable -we reviewed his medical record in detail. He presented with 2-6 months bloody diarrhea, fatigue, and weight loss. 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 Baseline CEA mildly elevated 6.1, staging work up negative for metastatic disease.  -  he underwent lap partial colectomy and small bowel resection on 06/20/20 by Dr. Marcello Moores. We reviewed surgical path in detail. 12.7 cm carcinoma was removed, 0/27 LNs+, margins clear. We reviewed he has no high risk features such as perforation, PNI, or LVI. -We discussed his stage II colon cancer was removed as best we can tell. The standard of stage II colon cancer is no adjuvant chemotherapy.  -we discussed there is still moderate risk of recurrence, which could be higher if ctDNA test is positive. He is interested in guardant reveal ctDNA test and agrees to proceed. If this is detected, we will discuss adjuvant chemo at that point.  -if ctDNA testing x3 are negative/not detected, he will continue with surveillance plan which includes lab and f/up q3-4 months for 2-3 years then q6-12 months for 5 years of surveillance. He understands he will need colonoscopy in 1 year. Plan to obtain surveillance scan in 1 year.  -we reviewed healthy lifestyle practices.  -Mr. Hansley appears stable. He is recovering well from surgery. BMs normal, no pain.  -He will proceed with lab today, then start monthly B12 injections next week. Guardant reveal in 5 and 12 weeks.  -f/up in 3 months   2. Diarrhea, weight loss, fatigue -for 2-6 months prior to diagnosis -Since cancer resection, BMs solid now, no bleeding -I encouraged him to have healthy diet and engage in routine exercise.   3.  Anemia, secondary to tumor bleeding; G95 and folic acid deficiency  -Hgb 9.5 on 3/31 pre-op; MCV 69 with thrombocytosis. Likely secondary  to tumor bleeding -work up showed serum iron 34, TIBC 144, ferritin 125; he was found to have low folate 3.5 and low B12 103 -he denies previous abdominal surgeries, his B12 and FA deficiencies might be from alcohol use -we recommend oral folic acid once daily and monthly B12. If intrinsic factor is negative, he can try oral B12  -He is Jehovah's Witness, does not accept blood products  -Today's CBC shows Hgb 11.1, MCV 71.9, normal Plt 375; overall improving. Intrinsic factor is pending  PLAN: -Colonoscopy, imaging, lab, surgery, and path report reviewed -Stage IIA sigmoid colon cancer s/p resection, proceed with surveillance -Labs today including ctDNA, and ctDNA 6/13, and week of 8/8 -Begin monthly B12 inj next week and start oral folic acid once daily -f/up in 3 months, week of 8/8, with last ctDNA draw    Orders Placed This Encounter  Procedures  . CEA (IN HOUSE-[facility])FOR [facility] ONLY    Standing Status:   Standing    Number of Occurrences:   20    Standing Expiration Date:   07/21/2021  . CMP ([place] only)    Standing Status:   Standing    Number of Occurrences:   50    Standing Expiration Date:   07/21/2021  . CBC with Differential ([facility] Only)    Standing Status:   Standing    Number of Occurrences:   50    Standing Expiration Date:   07/21/2021  . Intrinsic factor antibodies    Standing Status:   Standing    Number of Occurrences:   1    Standing Expiration Date:   07/21/2021  . Guardant 360    Guardant Reveal ctDNA test    Standing Status:   Standing    Number of Occurrences:   3    Standing Expiration Date:   07/21/2021  . Vitamin B12    Standing Status:   Standing  Number of Occurrences:   20    Standing Expiration Date:   07/21/2021  . Folate RBC    Standing Status:   Standing    Number of Occurrences:   20    Standing Expiration Date:   07/21/2021  . Folate, Serum    Standing Status:   Standing    Number of Occurrences:   20    Standing Expiration  Date:   07/21/2021    All questions were answered. The patient knows to call the clinic with any problems, questions or concerns.     Alla Feeling, NP 07/21/2020   Addendum  I have seen the patient, examined him. I agree with the assessment and and plan and have edited the notes.   Mr. Fitzwater is a 45 uo male with PMH of hypertension, dyslipidemia, and a Jehovah witness, presented with diarrhea, hematochezia and weight loss.  Work-up reviewed distal ascending colon cancer, surgical pathology showed T3N0 stage II disease, without high risk features, except a mildly elevated CEA before surgery.  I do not recommend adjuvant chemotherapy.  We discussed risk of recurrence, and surveillance plan.  In addition to routine follow-up, lab work, exam, CT scan and colonoscopy, I also recommend GuardianReveal for circulating tumor DNA as part of surveillance.  He agrees with the plan.  We also discussed management of his B12 and folate deficiency. All questions were answered.   Truitt Merle  07/21/2020

## 2020-07-22 LAB — INTRINSIC FACTOR ANTIBODIES: Intrinsic Factor: 1.1 AU/mL (ref 0.0–1.1)

## 2020-07-22 LAB — FOLATE RBC
Folate, Hemolysate: 286 ng/mL
Folate, RBC: 722 ng/mL (ref >498)
Hematocrit: 39.6 % (ref 37.5–51.0)

## 2020-07-28 ENCOUNTER — Inpatient Hospital Stay: Payer: Medicare Other

## 2020-07-28 ENCOUNTER — Other Ambulatory Visit: Payer: Self-pay

## 2020-07-28 ENCOUNTER — Ambulatory Visit: Payer: Medicare Other | Admitting: Nurse Practitioner

## 2020-07-28 VITALS — BP 119/89 | HR 95 | Temp 98.4°F | Resp 18

## 2020-07-28 DIAGNOSIS — C187 Malignant neoplasm of sigmoid colon: Secondary | ICD-10-CM | POA: Diagnosis not present

## 2020-07-28 DIAGNOSIS — D519 Vitamin B12 deficiency anemia, unspecified: Secondary | ICD-10-CM

## 2020-07-28 MED ORDER — CYANOCOBALAMIN 1000 MCG/ML IJ SOLN
1000.0000 ug | Freq: Once | INTRAMUSCULAR | Status: AC
  Administered 2020-07-28: 1000 ug via INTRAMUSCULAR

## 2020-07-28 MED ORDER — CYANOCOBALAMIN 1000 MCG/ML IJ SOLN
INTRAMUSCULAR | Status: AC
  Filled 2020-07-28: qty 1

## 2020-07-28 NOTE — Patient Instructions (Signed)

## 2020-07-30 ENCOUNTER — Telehealth: Payer: Self-pay

## 2020-07-30 NOTE — Telephone Encounter (Signed)
Called spoke with pt explained B12 and rest of lab work pt appreciative of update encouraged to call for any questions changes or concerns nothing further call ended

## 2020-07-30 NOTE — Telephone Encounter (Signed)
-----   Message from Alla Feeling, NP sent at 07/29/2020 10:53 PM EDT ----- Please let him know lab results. B12 is low, but intrinsic factor is normal so he should be able to absorb B12. I still recommend monthly injections for now but soon can transition to oral dose. CEA normal, CBC and CMP unremarkable. No concerns.  Thanks, Regan Rakers, NP

## 2020-08-06 LAB — GUARDANT 360

## 2020-08-07 ENCOUNTER — Telehealth: Payer: Self-pay

## 2020-08-07 NOTE — Telephone Encounter (Signed)
-----   Message from Alla Feeling, NP sent at 08/06/2020  1:36 PM EDT ----- Please let him know guardant reveal shows circulating tumor DNA is NOT detected, great news.  Next lab and B12 inj 6/13 Thanks, Regan Rakers NP

## 2020-08-07 NOTE — Telephone Encounter (Signed)
I spoke with Mr Delrossi and relayed [name]'s comments and recommendations.  He verbalized understanding.

## 2020-08-25 ENCOUNTER — Inpatient Hospital Stay: Payer: Medicare Other | Attending: Nurse Practitioner

## 2020-08-25 ENCOUNTER — Other Ambulatory Visit: Payer: Self-pay

## 2020-08-25 ENCOUNTER — Inpatient Hospital Stay: Payer: Medicare Other

## 2020-08-25 DIAGNOSIS — E538 Deficiency of other specified B group vitamins: Secondary | ICD-10-CM | POA: Insufficient documentation

## 2020-08-25 DIAGNOSIS — D519 Vitamin B12 deficiency anemia, unspecified: Secondary | ICD-10-CM

## 2020-08-25 DIAGNOSIS — C187 Malignant neoplasm of sigmoid colon: Secondary | ICD-10-CM | POA: Insufficient documentation

## 2020-08-25 LAB — CMP (CANCER CENTER ONLY)
ALT: 19 U/L (ref 0–44)
AST: 19 U/L (ref 15–41)
Albumin: 3.8 g/dL (ref 3.5–5.0)
Alkaline Phosphatase: 55 U/L (ref 38–126)
Anion gap: 7 (ref 5–15)
BUN: 10 mg/dL (ref 8–23)
CO2: 29 mmol/L (ref 22–32)
Calcium: 10.1 mg/dL (ref 8.9–10.3)
Chloride: 103 mmol/L (ref 98–111)
Creatinine: 1.18 mg/dL (ref 0.61–1.24)
GFR, Estimated: >60 mL/min (ref >60)
Glucose, Bld: 92 mg/dL (ref 70–99)
Potassium: 3.8 mmol/L (ref 3.5–5.1)
Sodium: 139 mmol/L (ref 135–145)
Total Bilirubin: 0.4 mg/dL (ref 0.3–1.2)
Total Protein: 8.2 g/dL — ABNORMAL HIGH (ref 6.5–8.1)

## 2020-08-25 LAB — CBC WITH DIFFERENTIAL (CANCER CENTER ONLY)
Abs Immature Granulocytes: 0.01 10*3/uL (ref 0.00–0.07)
Basophils Absolute: 0.1 10*3/uL (ref 0.0–0.1)
Basophils Relative: 1 %
Eosinophils Absolute: 0.1 10*3/uL (ref 0.0–0.5)
Eosinophils Relative: 2 %
HCT: 41 % (ref 39.0–52.0)
Hemoglobin: 13.1 g/dL (ref 13.0–17.0)
Immature Granulocytes: 0 %
Lymphocytes Relative: 42 %
Lymphs Abs: 1.9 10*3/uL (ref 0.7–4.0)
MCH: 24 pg — ABNORMAL LOW (ref 26.0–34.0)
MCHC: 32 g/dL (ref 30.0–36.0)
MCV: 75.2 fL — ABNORMAL LOW (ref 80.0–100.0)
Monocytes Absolute: 0.5 10*3/uL (ref 0.1–1.0)
Monocytes Relative: 10 %
Neutro Abs: 2 10*3/uL (ref 1.7–7.7)
Neutrophils Relative %: 45 %
Platelet Count: 359 10*3/uL (ref 150–400)
RBC: 5.45 MIL/uL (ref 4.22–5.81)
RDW: 21.9 % — ABNORMAL HIGH (ref 11.5–15.5)
WBC Count: 4.5 10*3/uL (ref 4.0–10.5)
nRBC: 0 % (ref 0.0–0.2)

## 2020-08-25 MED ORDER — CYANOCOBALAMIN 1000 MCG/ML IJ SOLN
1000.0000 ug | Freq: Once | INTRAMUSCULAR | Status: AC
  Administered 2020-08-25: 1000 ug via INTRAMUSCULAR

## 2020-08-25 MED ORDER — CYANOCOBALAMIN 1000 MCG/ML IJ SOLN
INTRAMUSCULAR | Status: AC
  Filled 2020-08-25: qty 1

## 2020-08-25 NOTE — Patient Instructions (Signed)
Cyanocobalamin, Vitamin B12 injection O que  este medicamento? A CIANOCOBALAMINA  uma forma sinttica de vitamina B12. A vitamina B12  essencial para o desenvolvimento de glbulos sanguneos, clulas nervosas e protenas saudveis pelo organismo. Tambm ajuda no metabolismo das gorduras e carboidratos. Este medicamento  usado para tratar pessoas que no conseguem absorver vitamina B12 suficiente. Este medicamento pode ser usado para outros propsitos; em caso de dvidas, pergunte ao seu profissional de sade ou farmacutico. NOMES DE MARCAS COMUNS: B-12 Compliance Kit, B-12 Injection Kit, Cyomin, LA-12, Nutri-Twelve, Physicians EZ Use B-12, Primabalt O que devo dizer a meu profissional de sade antes de tomar este medicamento? Precisam saber se voc tem algum dos seguintes problemas ou estados de sade: doenas renais sndrome ou doena de Leber anemia megaloblstica reao estranha ou alergia  cianocobalamina ou ao cobalto reao estranha ou alergia a outros medicamentos, alimentos, corantes ou conservantes est grvida ou tentando engravidar est amamentando Como devo usar este medicamento? Este medicamento  injetado por via intramuscular ou por injeo subcutnea profunda. Costuma ser administrado por um profissional de sade em consultrio ou Chief of Staff. Porm,  possvel que seu mdico lhe ensine como aplicar suas prprias injees. Siga todas as instrues. Fale com seu pediatra a respeito do uso deste medicamento em crianas. Pode ser preciso tomar alguns cuidados especiais. Superdosagem: Se achar que tomou uma superdosagem deste medicamento, entre em contato imediatamente com o [facility] Intoxicaes ou v a [employer]. OBSERVAO: Este medicamento  s para voc. No compartilhe este medicamento com outras pessoas. E se eu deixar de tomar uma dose? Se toma o medicamento em uma clnica ou no consultrio do seu mdico, ligue para Paramedic a Passenger transport manager. Se aplica as  injees por conta prpria e perdeu uma dose, tome-a assim que possvel. Se j estiver quase na hora da sua prxima dose, tome somente essa dose. No tome o remdio em dobro, nem tome uma dose adicional. O que pode interagir com este medicamento? colchicina consumo pesado de lcool Esta lista pode no descrever todas as interaes possveis. D ao seu profissional de sade uma lista de todos os medicamentos, ervas medicinais, remdios de venda livre, ou suplementos alimentares que voc [place]. Diga tambm se voc fuma, bebe, ou [place] drogas ilcitas. Alguns destes podem interagir com o seu medicamento. Ao que devo ficar atento quando estiver [employer] medicamento? Consulte seu mdico ou profissional de sade para acompanhamento regular Museum/gallery curator. Voc precisar fazer exames de sangue peridicos enquanto estiver [employer]. Voc pode precisar seguir uma dieta especial. Fale com seu mdico. Para conseguir o mximo benefcio deste medicamento, limite o seu consumo de lcool e evite fumar. Que efeitos colaterais posso sentir aps usar este medicamento? Efeitos colaterais que devem ser informados ao seu mdico ou profissional de sade o mais rpido possvel: reaes alrgicas, como erupo na pele, coceira, urticria, ou inchao do rosto, dos lbios ou da lngua pele azulada dor ou aperto no peito chiado no peito ou dificuldade para respirar tontura rea vermelha, inchada e dolorosa na perna Efeitos colaterais que normalmente no precisam de cuidados mdicos (avise ao seu mdico ou profissional de sade se persistirem ou forem incmodos): diarreia dor de cabea Esta lista pode no descrever todos os efeitos colaterais possveis. Para mais orientaes sobre efeitos colaterais, consulte o seu mdico. Voc pode relatar a ocorrncia de efeitos colaterais  FDA pelo telefone [phone]. Onde devo guardar meu medicamento? [facility] fora do [employer]. Conservar em [employer], entre 15 e 30 degreesC (  59 e 86 degreesF). Proteger Administrator, arts. Descartar qualquer medicamento no utilizado aps a data de validade impressa no rtulo ou embalagem. OBSERVAO: Este folheto  um resumo. Pode no cobrir todas as informaes possveis. Se tiver dvidas a respeito deste medicamento, fale com seu mdico, farmacutico ou profissional de sade.  2021 Elsevier/Gold Standard (2009-12-03 00:00:00)

## 2020-08-27 ENCOUNTER — Telehealth: Payer: Self-pay

## 2020-08-27 NOTE — Telephone Encounter (Signed)
-----   Message from Alla Feeling, NP sent at 08/26/2020 12:11 PM EDT ----- Please let him know lab results. Anemia resolved, labs look good. Will let him know ctDNA results.  Thanks, Regan Rakers, NP

## 2020-08-27 NOTE — Telephone Encounter (Signed)
Patient has been made aware of his lab results and verbalized understanding.

## 2020-08-29 ENCOUNTER — Telehealth: Payer: Self-pay

## 2020-08-29 NOTE — Telephone Encounter (Signed)
MR Lingo called.  He states his daughters are nurses and they were wanting to understand why he would not need CT scans until April 2023

## 2020-09-04 ENCOUNTER — Encounter: Payer: Self-pay | Admitting: Nurse Practitioner

## 2020-09-04 NOTE — H&P (Addendum)
Previous H&P placed on 3/21 was incorrect.  This is updated, correct H&P   History of Present Illness  The patient is a 65 year old male who presents with colorectal cancer. 65 year old male who presents to the office with a new diagnosis of colon cancer.  He recently underwent a screening colonoscopy after noting a change in bowel habits.  He was found to have a large mass in the sigmoid colon, which was unable to be traversed.  This was tattooed.  He reports frequent loose bowel movements now.  He is not taking anything for this.  He reports a mass in the right side of his abdomen that has been there for several months.  He occasionally has some left lower quadrant pain.  CT scans of the chest, abdomen and pelvis were performed after biopsy confirmed invasive adenocarcinoma.  This showed no sign of metastatic disease and a large mass, which was felt to be arising from the sigmoid colon.   Past Surgical History  Knee Surgery   Right.  Diagnostic Studies History  Colonoscopy   within last year  Allergies Mammie Lorenzo, LPN; 9/67/8938 1:01 AM) No Known Drug Allergies   [06/02/2020]: Allergies Reconciled    Medication History  amLODIPine Besylate  (10MG  Tablet, Oral) Active. hydroCHLOROthiazide  (12.5MG  Tablet, Oral) Active. Losartan Potassium  (100MG  Tablet, Oral) Active. Medications Reconciled   Social History  Alcohol use   Occasional alcohol use. Caffeine use   Tea. No drug use   Tobacco use   Former smoker.  Family History  Respiratory Condition   Mother.  Other Problems  Hemorrhoids   High blood pressure      Review of Systems  General Present- Weight Loss. Not Present- Appetite Loss, Chills, Fatigue, Fever, Night Sweats and Weight Gain. Skin Not Present- Change in Wart/Mole, Dryness, Hives, Jaundice, New Lesions, Non-Healing Wounds, Rash and Ulcer. HEENT Present- Hearing Loss and Wears glasses/contact lenses. Not Present- Earache, Hoarseness, Nose Bleed, Oral Ulcers,  Ringing in the Ears, Seasonal Allergies, Sinus Pain, Sore Throat, Visual Disturbances and Yellow Eyes. Respiratory Not Present- Bloody sputum, Chronic Cough, Difficulty Breathing, Snoring and Wheezing. Breast Not Present- Breast Mass, Breast Pain, Nipple Discharge and Skin Changes. Cardiovascular Not Present- Chest Pain, Difficulty Breathing Lying Down, Leg Cramps, Palpitations, Rapid Heart Rate, Shortness of Breath and Swelling of Extremities. Gastrointestinal Present- Abdominal Pain, Bloating, Chronic diarrhea and Hemorrhoids. Not Present- Bloody Stool, Change in Bowel Habits, Constipation, Difficulty Swallowing, Excessive gas, Gets full quickly at meals, Indigestion, Nausea, Rectal Pain and Vomiting. Male Genitourinary Present- Nocturia. Not Present- Blood in Urine, Change in Urinary Stream, Frequency, Impotence, Painful Urination, Urgency and Urine Leakage. Musculoskeletal Not Present- Back Pain, Joint Pain, Joint Stiffness, Muscle Pain, Muscle Weakness and Swelling of Extremities. Neurological Not Present- Decreased Memory, Fainting, Headaches, Numbness, Seizures, Tingling, Tremor, Trouble walking and Weakness. Psychiatric Not Present- Anxiety, Bipolar, Change in Sleep Pattern, Depression, Fearful and Frequent crying. Endocrine Not Present- Cold Intolerance, Excessive Hunger, Hair Changes, Heat Intolerance, Hot flashes and New Diabetes. Hematology Not Present- Blood Thinners, Easy Bruising, Excessive bleeding, Gland problems, HIV and Persistent Infections.  Vitals  Weight: 263.6 lb   Height: 71.5 in  Body Surface Area: 2.38 m   Body Mass Index: 36.25 kg/m   Pulse: 123 (Regular)    BP: 144/82(Sitting, Right Arm, Standard)   Physical Exam  General Mental Status - Alert. General Appearance - Cooperative. CV:RRR Lungs: CTA Abdomen Palpation/Percussion Palpation and Percussion of the abdomen reveal - Soft. Tenderness - Note: Mass palpated  within suprapubic area which is tender to  palpation.    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 CT scan showed no sign of metastatic disease. The tumor is tattooed. I have asked him to use MiraLAX on a daily basis to help with bowel function prior to surgery. We will plan on an urgent robotic-assisted partial colectomy. We have discussed this in detail including risks, benefits and alternatives. All questions were answered. Once the pathology is removed, we will proceed accordingly. The surgery and anatomy were described to the patient as well as the risks of surgery and the possible complications. These include: Bleeding, deep abdominal infections and possible wound complications such as hernia and infection, damage to adjacent structures, leak of surgical connections, which can lead to other surgeries and possibly an ostomy, possible need for other procedures, such as abscess drains in radiology, possible prolonged hospital stay, possible diarrhea from removal of part of the colon, possible constipation from narcotics, possible bowel, bladder or sexual dysfunction if having rectal surgery, prolonged fatigue/weakness or appetite loss, possible early recurrence of of disease, possible complications of their medical problems such as heart disease or arrhythmias or lung problems, death (less than 1%). I believe the patient understands and wishes to proceed with the surgery.

## 2020-09-22 ENCOUNTER — Other Ambulatory Visit: Payer: Self-pay

## 2020-09-22 ENCOUNTER — Inpatient Hospital Stay: Payer: Medicare Other | Attending: Nurse Practitioner

## 2020-09-22 ENCOUNTER — Encounter: Payer: Self-pay | Admitting: Nurse Practitioner

## 2020-09-22 DIAGNOSIS — E538 Deficiency of other specified B group vitamins: Secondary | ICD-10-CM | POA: Insufficient documentation

## 2020-09-22 DIAGNOSIS — D519 Vitamin B12 deficiency anemia, unspecified: Secondary | ICD-10-CM

## 2020-09-22 MED ORDER — CYANOCOBALAMIN 1000 MCG/ML IJ SOLN
1000.0000 ug | Freq: Once | INTRAMUSCULAR | Status: AC
  Administered 2020-09-22: 1000 ug via INTRAMUSCULAR

## 2020-09-22 MED ORDER — CYANOCOBALAMIN 1000 MCG/ML IJ SOLN
INTRAMUSCULAR | Status: AC
  Filled 2020-09-22: qty 1

## 2020-09-23 LAB — GUARDANT 360

## 2020-09-24 ENCOUNTER — Telehealth: Payer: Self-pay

## 2020-09-24 NOTE — Telephone Encounter (Signed)
This nurse spoke with patient and made aware of negative Guardant Reveal per Eric Rue, NP.  Informed of last draw will be on 10/21/2020.  No further questions or concerns at this time.  Patient knows to call clinic with any questions, concerns or problems.

## 2020-09-26 ENCOUNTER — Encounter: Payer: Self-pay | Admitting: Nurse Practitioner

## 2020-10-20 ENCOUNTER — Telehealth: Payer: Self-pay | Admitting: Nurse Practitioner

## 2020-10-20 NOTE — Telephone Encounter (Signed)
R/s appts due to [name] being out. Pt aware.

## 2020-10-21 ENCOUNTER — Ambulatory Visit: Payer: Medicare Other | Admitting: Nurse Practitioner

## 2020-10-21 ENCOUNTER — Other Ambulatory Visit: Payer: Medicare Other

## 2020-10-21 ENCOUNTER — Inpatient Hospital Stay: Payer: Medicare Other

## 2020-10-21 ENCOUNTER — Ambulatory Visit: Payer: Medicare Other

## 2020-10-21 ENCOUNTER — Inpatient Hospital Stay: Payer: Medicare Other | Admitting: Nurse Practitioner

## 2020-10-23 ENCOUNTER — Telehealth: Payer: Self-pay | Admitting: Nurse Practitioner

## 2020-10-23 NOTE — Telephone Encounter (Signed)
Left message with changed MD visit to [place] video visit per 8/11 staff message.

## 2020-10-27 ENCOUNTER — Inpatient Hospital Stay: Payer: Medicare Other

## 2020-10-27 ENCOUNTER — Inpatient Hospital Stay (HOSPITAL_BASED_OUTPATIENT_CLINIC_OR_DEPARTMENT_OTHER): Payer: Medicare Other | Admitting: Nurse Practitioner

## 2020-10-27 ENCOUNTER — Inpatient Hospital Stay: Payer: Medicare Other | Attending: Nurse Practitioner

## 2020-10-27 ENCOUNTER — Telehealth: Payer: Self-pay | Admitting: Nurse Practitioner

## 2020-10-27 ENCOUNTER — Other Ambulatory Visit: Payer: Self-pay

## 2020-10-27 ENCOUNTER — Encounter: Payer: Self-pay | Admitting: Nurse Practitioner

## 2020-10-27 VITALS — BP 157/97 | HR 78 | Temp 98.2°F | Resp 17 | Wt 299.2 lb

## 2020-10-27 DIAGNOSIS — C187 Malignant neoplasm of sigmoid colon: Secondary | ICD-10-CM | POA: Diagnosis not present

## 2020-10-27 DIAGNOSIS — Z79899 Other long term (current) drug therapy: Secondary | ICD-10-CM | POA: Diagnosis not present

## 2020-10-27 DIAGNOSIS — D649 Anemia, unspecified: Secondary | ICD-10-CM | POA: Diagnosis not present

## 2020-10-27 DIAGNOSIS — D519 Vitamin B12 deficiency anemia, unspecified: Secondary | ICD-10-CM

## 2020-10-27 DIAGNOSIS — E538 Deficiency of other specified B group vitamins: Secondary | ICD-10-CM | POA: Diagnosis present

## 2020-10-27 LAB — CMP (CANCER CENTER ONLY)
ALT: 17 U/L (ref 0–44)
AST: 18 U/L (ref 15–41)
Albumin: 3.6 g/dL (ref 3.5–5.0)
Alkaline Phosphatase: 43 U/L (ref 38–126)
Anion gap: 10 (ref 5–15)
BUN: 13 mg/dL (ref 8–23)
CO2: 28 mmol/L (ref 22–32)
Calcium: 9.4 mg/dL (ref 8.9–10.3)
Chloride: 103 mmol/L (ref 98–111)
Creatinine: 1.21 mg/dL (ref 0.61–1.24)
GFR, Estimated: >60 mL/min (ref >60)
Glucose, Bld: 139 mg/dL — ABNORMAL HIGH (ref 70–99)
Potassium: 3.2 mmol/L — ABNORMAL LOW (ref 3.5–5.1)
Sodium: 141 mmol/L (ref 135–145)
Total Bilirubin: 0.4 mg/dL (ref 0.3–1.2)
Total Protein: 7.4 g/dL (ref 6.5–8.1)

## 2020-10-27 LAB — CBC WITH DIFFERENTIAL (CANCER CENTER ONLY)
Abs Immature Granulocytes: 0.02 10*3/uL (ref 0.00–0.07)
Basophils Absolute: 0 10*3/uL (ref 0.0–0.1)
Basophils Relative: 1 %
Eosinophils Absolute: 0.1 10*3/uL (ref 0.0–0.5)
Eosinophils Relative: 3 %
HCT: 42.1 % (ref 39.0–52.0)
Hemoglobin: 13.8 g/dL (ref 13.0–17.0)
Immature Granulocytes: 0 %
Lymphocytes Relative: 38 %
Lymphs Abs: 1.8 10*3/uL (ref 0.7–4.0)
MCH: 25 pg — ABNORMAL LOW (ref 26.0–34.0)
MCHC: 32.8 g/dL (ref 30.0–36.0)
MCV: 76.3 fL — ABNORMAL LOW (ref 80.0–100.0)
Monocytes Absolute: 0.4 10*3/uL (ref 0.1–1.0)
Monocytes Relative: 8 %
Neutro Abs: 2.3 10*3/uL (ref 1.7–7.7)
Neutrophils Relative %: 50 %
Platelet Count: 274 10*3/uL (ref 150–400)
RBC: 5.52 MIL/uL (ref 4.22–5.81)
RDW: 18.6 % — ABNORMAL HIGH (ref 11.5–15.5)
WBC Count: 4.6 10*3/uL (ref 4.0–10.5)
nRBC: 0 % (ref 0.0–0.2)

## 2020-10-27 LAB — FOLATE: Folate: 6.4 ng/mL (ref >5.9)

## 2020-10-27 LAB — CEA (IN HOUSE-CHCC): CEA (CHCC-In House): <1 ng/mL (ref 0.00–5.00)

## 2020-10-27 LAB — VITAMIN B12: Vitamin B-12: 555 pg/mL (ref 180–914)

## 2020-10-27 MED ORDER — CYANOCOBALAMIN 1000 MCG/ML IJ SOLN
1000.0000 ug | Freq: Once | INTRAMUSCULAR | Status: AC
  Administered 2020-10-27: 1000 ug via INTRAMUSCULAR
  Filled 2020-10-27: qty 1

## 2020-10-27 NOTE — Patient Instructions (Signed)
Vitamin B12 Injection What is this medication? Vitamin B12 (VAHY tuh min B12) prevents and treats low vitamin B12 levels in your body. It is used in people who do not get enough vitamin B12 from their diet or when their digestive tract does not absorb enough. Vitamin B12 plays an important role in maintaining the health of your nervous system and red bloodcells. This medicine may be used for other purposes; ask your health care provider orpharmacist if you have questions. COMMON BRAND NAME(S): B-12 Compliance Kit, B-12 Injection Kit, Cyomin, LA-12,Nutri-Twelve, Physicians EZ Use B-12, Primabalt What should I tell my care team before I take this medication? They need to know if you have any of these conditions: Kidney disease Leber's disease Megaloblastic anemia An unusual or allergic reaction to cyanocobalamin, cobalt, other medications, foods, dyes, or preservatives Pregnant or trying to get pregnant Breast-feeding How should I use this medication? This medication is injected into a muscle or deeply under the skin. It is usually given in a clinic or care team's office. However, your care team mayteach you how to inject yourself. Follow all instructions. Talk to your care team about the use of this medication in children. Specialcare may be needed. Overdosage: If you think you have taken too much of this medicine contact apoison control center or emergency room at once. NOTE: This medicine is only for you. Do not share this medicine with others. What if I miss a dose? If you are given your dose at a clinic or care team's office, call to reschedule your appointment. If you give your own injections, and you miss a dose, take it as soon as you can. If it is almost time for your next dose, takeonly that dose. Do not take double or extra doses. What may interact with this medication? Colchicine Heavy alcohol intake This list may not describe all possible interactions. Give your health care provider  a list of all the medicines, herbs, non-prescription drugs, or dietary supplements you use. Also tell them if you smoke, drink alcohol, or use illegaldrugs. Some items may interact with your medicine. What should I watch for while using this medication? Visit your care team regularly. You may need blood work done while you aretaking this medication. You may need to follow a special diet. Talk to your care team. Limit youralcohol intake and avoid smoking to get the best benefit. What side effects may I notice from receiving this medication? Side effects that you should report to your care team as soon as possible: Allergic reactions-skin rash, itching, hives, swelling of the face, lips, tongue, or throat Swelling of the ankles, hands, or feet Trouble breathing Side effects that usually do not require medical attention (report to your careteam if they continue or are bothersome): Diarrhea This list may not describe all possible side effects. Call your doctor for medical advice about side effects. You may report side effects to FDA at1-800-FDA-1088. Where should I keep my medication? Keep out of the reach of children. Store at room temperature between 15 and 30 degrees C (59 and 85 degrees F).Protect from light. Throw away any unused medication after the expiration date. NOTE: This sheet is a summary. It may not cover all possible information. If you have questions about this medicine, talk to your doctor, pharmacist, orhealth care provider.  2022 Elsevier/Gold Standard (2020-04-21 11:47:06)  

## 2020-10-27 NOTE — Progress Notes (Signed)
[facility]   [phone] [phone]   Clinic Follow up Note   Patient Care Team: Kristie Cowman, MD as PCP - General (Family Medicine) Alla Feeling, NP as Nurse Practitioner (Oncology) Truitt Merle, MD as Consulting Physician (Hematology and Oncology) Leighton Ruff, MD as Consulting Physician (General Surgery) 10/27/2020  I connected with Eric Macdonald on 10/27/20 at  9:30 AM EDT by video enabled telemedicine visit and verified that I am speaking with the correct person using two identifiers.   I discussed the limitations, risks, security and privacy concerns of performing an evaluation and management service by telemedicine and the availability of in-person appointments. I also discussed with the patient that there may be a patient responsible charge related to this service. The patient expressed understanding and agreed to proceed.   Other persons participating in the visit and their role in the encounter: None   Patient's location: [facility] exam room Provider's location: home  CHIEF COMPLAINT: Follow up colon cancer   SUMMARY OF ONCOLOGIC HISTORY: Oncology History  Cancer of sigmoid colon ([place])  05/15/2020 Procedure   Colonoscopy by Dr. [name]: obstructing mass found in the sigmoid colon located 35 cm from the anus.  Unable to advance past the mass.  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  No metastatic disease in the chest, abdomen, pelvis   06/12/2020 Tumor Marker   Baseline CEA 6.1   06/20/2020 Initial Diagnosis   Cancer of sigmoid  colon ([place])   06/20/2020 Cancer Staging   Staging form: Colon and Rectum, AJCC 8th Edition - Pathologic stage from 06/20/2020: Stage IIA (pT3, pN0, cM0) - Signed by Alla Feeling, NP on 07/21/2020 Stage prefix: Initial diagnosis Total positive nodes: 0 Histologic grading system: 4 grade system Histologic grade (G): G2   06/20/2020 Definitive Surgery   Laparoscopic partial colectomy, small bowel resection by Dr. Marcello Moores   06/20/2020 Pathology Results   FINAL MICROSCOPIC DIAGNOSIS:   A. COLON, SIGMOID AND ATTACHED SMALL BOWEL, RESECTION:  -  Adenocarcinoma, moderately differentiated, 12.7 cm  -  No carcinoma identified in twenty-four lymph nodes (0/24)  -  Margins uninvolved by carcinoma (0.2 cm; mesenteric margin)  -  Sessile serrated polyp  -  See oncology table and comment below   B. COLON, ADDITIONAL PROXIMAL MARGIN, RESECTION:  -  No residual adenocarcinoma identified  -  No carcinoma identified in three lymph nodes (0/3)   MMR normal, MSI-stable     CURRENT THERAPY: Surveillance   INTERVAL HISTORY: [name] presents for surveillance visit as scheduled. He is feeling well, gaining weight. Once weather cools he can walk up to 6 miles per day. He is very active. Bowels moving normally. Denies bloody or black stool, abdominal pain or bloating. Incision was itchy at first, healed without open area, redness, or drainage. The scar feels "like a rope." He takes oral iron and B12 daily. Not taking folic acid or potassium.   All other systems were reviewed with the patient and are negative.  MEDICAL HISTORY:  Past Medical History:  Diagnosis Date   Arthritis    Cancer ([place])  colon   Chronic low back pain    Is supposed to have surgery soon at L4-5   Hypertension    Knee pain     SURGICAL HISTORY: Past Surgical History:  Procedure Laterality Date   JOINT REPLACEMENT     knee 05/2011   KNEE SURGERY  08   rt arthroscopy   LAPAROSCOPIC PARTIAL COLECTOMY N/A 06/20/2020    Procedure: LAPAROSCOPIC PARTIAL COLECTOMY;  Surgeon: Leighton Ruff, MD;  Location: [facility];  Service: General;  Laterality: N/A;   TOTAL KNEE ARTHROPLASTY  05/05/2011   Procedure: TOTAL KNEE ARTHROPLASTY;  Surgeon: Ninetta Lights, MD;  Location: [facility];  Service: Orthopedics;  Laterality: Right;  DR MURPHY WANTS 90 MINUTES FOR THIS CASE    I have reviewed the social history and family history with the patient and they are unchanged from previous note.  ALLERGIES:  is allergic to other.  MEDICATIONS:  Current Outpatient Medications  Medication Sig Dispense Refill   acetaminophen (TYLENOL) 500 MG tablet Take 2 tablets (1,000 mg total) by mouth every 6 (six) hours. 30 tablet 0   amLODipine (NORVASC) 10 MG tablet Take 10 mg by mouth daily.     hydrochlorothiazide (MICROZIDE) 12.5 MG capsule Take 12.5 mg by mouth daily.     losartan (COZAAR) 100 MG tablet Take 100 mg by mouth daily.     traMADol (ULTRAM) 50 MG tablet Take 1-2 tablets (50-100 mg total) by mouth every 6 (six) hours as needed for moderate pain or severe pain. 30 tablet 0   No current facility-administered medications for this visit.    PHYSICAL EXAMINATION: ECOG PERFORMANCE STATUS: 0 - Asymptomatic  Vitals:   10/27/20 1024  BP: (!) 157/97  Pulse: 78  Resp: 17  Temp: 98.2 F (36.8 C)  SpO2: 97%   Filed Weights   10/27/20 1024  Weight: 299 lb 3.2 oz (135.7 kg)    GENERAL:alert, no distress and comfortable SKIN:  no rashes or significant lesions to exposed skin LUNGS:  normal breathing effort ABDOMEN: deferred  NEURO: alert & oriented x 3 with fluent speech Limited exam on virtual visit   LABORATORY DATA:  I have reviewed the data as listed CBC Latest Ref Rng & Units 10/27/2020 08/25/2020 07/21/2020  WBC 4.0 - 10.5 K/uL 4.6 4.5 5.5  Hemoglobin 13.0 - 17.0 g/dL 13.8 13.1 11.1(L)  Hematocrit 39.0 - 52.0 % 42.1 41.0 35.3(L)  Platelets 150 - 400 K/uL 274 359 375     CMP Latest Ref Rng & Units 10/27/2020 08/25/2020  07/21/2020  Glucose 70 - 99 mg/dL 139(H) 92 91  BUN 8 - 23 mg/dL 13 10 8   Creatinine 0.61 - 1.24 mg/dL 1.21 1.18 1.09  Sodium 135 - 145 mmol/L 141 139 140  Potassium 3.5 - 5.1 mmol/L 3.2(L) 3.8 3.5  Chloride 98 - 111 mmol/L 103 103 103  CO2 22 - 32 mmol/L 28 29 26   Calcium 8.9 - 10.3 mg/dL 9.4 10.1 9.4  Total Protein 6.5 - 8.1 g/dL 7.4 8.2(H) 8.0  Total Bilirubin 0.3 - 1.2 mg/dL 0.4 0.4 0.3  Alkaline Phos 38 - 126 U/L 43 55 64  AST 15 - 41 U/L 18 19 25   ALT 0 - 44 U/L 17 19 33      RADIOGRAPHIC STUDIES: I have personally reviewed the radiological images as listed and agreed with the findings in the report. No results found.   ASSESSMENT & PLAN: 65 yo male    1.  Adenocarcinoma of the sigmoid colon,  moderately differentiated G2, pT3pN0M0 stage IIA, MMR normal, MSI-stable -He presented with 2-6 months bloody diarrhea, fatigue, and 20 lbs weight loss. 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 Baseline CEA mildly elevated 6.1, staging work up negative for metastatic disease.  -S/p partial colectomy 06/20/20 by Dr. Marcello Moores, surgical path showed 12.7 cm carcinoma was removed, 0/27 LNs+, margins clear. There were no high risk features such as perforation, PNI, or LVI. -Given stage II colon cancer, adjuvant chemotherapy is not recommended. -Due to his moderate recurrence risk, he has been on surveillance, ctDNA not detected on 07/21/20 and 08/25/20, last draw done today (10/27/20) -continue surveillance plan which includes lab and f/up q3-4 months for 2-3 years then q6-12 months for 5 years of surveillance. Anticipate colonoscopy and surveillance CT 06/2021  2. Diarrhea, weight loss, fatigue -for 2-6 months prior to diagnosis -resolved after surgery -normal BMs, gaining weight (10/27/20)   3.  Anemia, secondary to tumor bleeding; Y80 and folic acid deficiency  -Hgb 9.5 on 3/31 pre-op; MCV 69 with thrombocytosis. Likely secondary to tumor bleeding -work up showed serum iron 34, TIBC 144,  ferritin 125; he was found to have low folate 3.5 and low B12 103 -he denies previous abdominal surgeries, intrinsic factor is normal. B12 and FA deficiencies might be from alcohol use.  -He is Jehovah's witness, does not accept blood products.  -He began oral iron and B12 with monthly B12 injections as well.  -10/27/20 B12 is normal, folate is pending  Disposition:  Mr. Attar is clinically doing well. Labs reviewed, anemia resolved, B12 is normal on once daily oral and monthly B12 injections. Folate is pending. K 3.2 on HCTZ and losartan. I recommend to add potassium supplement MWF. CEA is normal. I reviewed ctDNA not detected x2, with last draw today. There is no clinical concern for colon cancer recurrence.   Continue surveillance. I encouraged him to continue healthy lifestyle, physical exercise, normal diet, avoiding tobacco, and limiting alcohol. We reviewed s/sx of recurrence.   Return for lab and B12 injection monthly x3, with f/up in 3 months.   I discussed the assessment and treatment plan. The patient was provided an opportunity to ask questions and all were answered. He agreed and demonstrated an understanding of the instructions. The patient was advised to call back or seek an in-person evaluation if the symptoms worsen or if the condition fails to improve as anticipated. Total encounter time was 15 minutes.      Alla Feeling, NP 10/27/20

## 2020-10-27 NOTE — Telephone Encounter (Signed)
Scheduled follow-up appointments per 8/15 los. Patient is aware. 

## 2020-10-28 LAB — FOLATE RBC
Folate, Hemolysate: 362 ng/mL
Folate, RBC: 823 ng/mL (ref >498)
Hematocrit: 44 % (ref 37.5–51.0)

## 2020-11-19 ENCOUNTER — Telehealth: Payer: Self-pay

## 2020-11-19 NOTE — Telephone Encounter (Signed)
This nurse spoke with patient and made aware of Guardant Reveal test results.  Advised per Dr. Burr Medico that results were negative.  Reminded patient of lab appt and B-12 injection scheduled for Monday 9/12 at 3pm.  Patient acknowledged understanding.  No further questions or concerns at this time.

## 2020-11-21 LAB — GUARDANT 360

## 2020-11-24 ENCOUNTER — Other Ambulatory Visit: Payer: Self-pay

## 2020-11-24 ENCOUNTER — Inpatient Hospital Stay: Payer: Medicare Other | Attending: Nurse Practitioner

## 2020-11-24 ENCOUNTER — Inpatient Hospital Stay: Payer: Medicare Other

## 2020-11-24 DIAGNOSIS — C187 Malignant neoplasm of sigmoid colon: Secondary | ICD-10-CM | POA: Diagnosis present

## 2020-11-24 DIAGNOSIS — E538 Deficiency of other specified B group vitamins: Secondary | ICD-10-CM | POA: Diagnosis not present

## 2020-11-24 DIAGNOSIS — D519 Vitamin B12 deficiency anemia, unspecified: Secondary | ICD-10-CM

## 2020-11-24 LAB — CBC WITH DIFFERENTIAL (CANCER CENTER ONLY)
Abs Immature Granulocytes: 0 10*3/uL (ref 0.00–0.07)
Basophils Absolute: 0 10*3/uL (ref 0.0–0.1)
Basophils Relative: 1 %
Eosinophils Absolute: 0.1 10*3/uL (ref 0.0–0.5)
Eosinophils Relative: 2 %
HCT: 40 % (ref 39.0–52.0)
Hemoglobin: 13.4 g/dL (ref 13.0–17.0)
Immature Granulocytes: 0 %
Lymphocytes Relative: 43 %
Lymphs Abs: 1.9 10*3/uL (ref 0.7–4.0)
MCH: 25.9 pg — ABNORMAL LOW (ref 26.0–34.0)
MCHC: 33.5 g/dL (ref 30.0–36.0)
MCV: 77.2 fL — ABNORMAL LOW (ref 80.0–100.0)
Monocytes Absolute: 0.5 10*3/uL (ref 0.1–1.0)
Monocytes Relative: 11 %
Neutro Abs: 1.9 10*3/uL (ref 1.7–7.7)
Neutrophils Relative %: 43 %
Platelet Count: 283 10*3/uL (ref 150–400)
RBC: 5.18 MIL/uL (ref 4.22–5.81)
RDW: 19.3 % — ABNORMAL HIGH (ref 11.5–15.5)
WBC Count: 4.5 10*3/uL (ref 4.0–10.5)
nRBC: 0 % (ref 0.0–0.2)

## 2020-11-24 LAB — CMP (CANCER CENTER ONLY)
ALT: 22 U/L (ref 0–44)
AST: 28 U/L (ref 15–41)
Albumin: 3.7 g/dL (ref 3.5–5.0)
Alkaline Phosphatase: 50 U/L (ref 38–126)
Anion gap: 8 (ref 5–15)
BUN: 12 mg/dL (ref 8–23)
CO2: 29 mmol/L (ref 22–32)
Calcium: 9.4 mg/dL (ref 8.9–10.3)
Chloride: 104 mmol/L (ref 98–111)
Creatinine: 1.38 mg/dL — ABNORMAL HIGH (ref 0.61–1.24)
GFR, Estimated: 57 mL/min — ABNORMAL LOW (ref >60)
Glucose, Bld: 99 mg/dL (ref 70–99)
Potassium: 4.4 mmol/L (ref 3.5–5.1)
Sodium: 141 mmol/L (ref 135–145)
Total Bilirubin: 0.6 mg/dL (ref 0.3–1.2)
Total Protein: 7.6 g/dL (ref 6.5–8.1)

## 2020-11-24 MED ORDER — CYANOCOBALAMIN 1000 MCG/ML IJ SOLN
1000.0000 ug | Freq: Once | INTRAMUSCULAR | Status: AC
Start: 2020-11-24 — End: 2020-11-24
  Administered 2020-11-24: 1000 ug via INTRAMUSCULAR
  Filled 2020-11-24: qty 1

## 2020-12-22 ENCOUNTER — Other Ambulatory Visit: Payer: Medicare Other

## 2020-12-22 ENCOUNTER — Ambulatory Visit: Payer: Medicare Other

## 2020-12-29 ENCOUNTER — Other Ambulatory Visit: Payer: Medicare Other

## 2020-12-29 ENCOUNTER — Ambulatory Visit: Payer: Medicare Other

## 2021-01-13 ENCOUNTER — Other Ambulatory Visit: Payer: Self-pay

## 2021-01-13 ENCOUNTER — Inpatient Hospital Stay: Payer: Medicare Other

## 2021-01-13 ENCOUNTER — Inpatient Hospital Stay: Payer: Medicare Other | Attending: Nurse Practitioner

## 2021-01-13 DIAGNOSIS — D6489 Other specified anemias: Secondary | ICD-10-CM | POA: Diagnosis not present

## 2021-01-13 DIAGNOSIS — E538 Deficiency of other specified B group vitamins: Secondary | ICD-10-CM | POA: Insufficient documentation

## 2021-01-13 DIAGNOSIS — C187 Malignant neoplasm of sigmoid colon: Secondary | ICD-10-CM | POA: Diagnosis not present

## 2021-01-13 DIAGNOSIS — D519 Vitamin B12 deficiency anemia, unspecified: Secondary | ICD-10-CM

## 2021-01-13 LAB — CMP (CANCER CENTER ONLY)
ALT: 20 U/L (ref 0–44)
AST: 16 U/L (ref 15–41)
Albumin: 3.6 g/dL (ref 3.5–5.0)
Alkaline Phosphatase: 51 U/L (ref 38–126)
Anion gap: 7 (ref 5–15)
BUN: 18 mg/dL (ref 8–23)
CO2: 29 mmol/L (ref 22–32)
Calcium: 9.1 mg/dL (ref 8.9–10.3)
Chloride: 104 mmol/L (ref 98–111)
Creatinine: 1.37 mg/dL — ABNORMAL HIGH (ref 0.61–1.24)
GFR, Estimated: 57 mL/min — ABNORMAL LOW (ref >60)
Glucose, Bld: 132 mg/dL — ABNORMAL HIGH (ref 70–99)
Potassium: 3.9 mmol/L (ref 3.5–5.1)
Sodium: 140 mmol/L (ref 135–145)
Total Bilirubin: 0.4 mg/dL (ref 0.3–1.2)
Total Protein: 7.8 g/dL (ref 6.5–8.1)

## 2021-01-13 LAB — CBC WITH DIFFERENTIAL (CANCER CENTER ONLY)
Abs Immature Granulocytes: 0.02 10*3/uL (ref 0.00–0.07)
Basophils Absolute: 0 10*3/uL (ref 0.0–0.1)
Basophils Relative: 1 %
Eosinophils Absolute: 0.2 10*3/uL (ref 0.0–0.5)
Eosinophils Relative: 3 %
HCT: 41.3 % (ref 39.0–52.0)
Hemoglobin: 13.1 g/dL (ref 13.0–17.0)
Immature Granulocytes: 0 %
Lymphocytes Relative: 39 %
Lymphs Abs: 2.3 10*3/uL (ref 0.7–4.0)
MCH: 25.7 pg — ABNORMAL LOW (ref 26.0–34.0)
MCHC: 31.7 g/dL (ref 30.0–36.0)
MCV: 81 fL (ref 80.0–100.0)
Monocytes Absolute: 0.5 10*3/uL (ref 0.1–1.0)
Monocytes Relative: 9 %
Neutro Abs: 2.8 10*3/uL (ref 1.7–7.7)
Neutrophils Relative %: 48 %
Platelet Count: 339 10*3/uL (ref 150–400)
RBC: 5.1 MIL/uL (ref 4.22–5.81)
RDW: 17.6 % — ABNORMAL HIGH (ref 11.5–15.5)
WBC Count: 5.8 10*3/uL (ref 4.0–10.5)
nRBC: 0 % (ref 0.0–0.2)

## 2021-01-13 MED ORDER — CYANOCOBALAMIN 1000 MCG/ML IJ SOLN
1000.0000 ug | Freq: Once | INTRAMUSCULAR | Status: AC
  Administered 2021-01-13: 1000 ug via INTRAMUSCULAR
  Filled 2021-01-13: qty 1

## 2021-01-15 ENCOUNTER — Telehealth: Payer: Self-pay

## 2021-01-15 NOTE — Telephone Encounter (Signed)
Spoke with pt via telephone regarding following up with his PCP De. Kristie Cowman, MD w/[facility] about his renal dysfunction.  Went over pt's follow-up appt scheduled with Dr. Burr Medico and Lab on 01/26/2021.  Pt confirmed appts and verbalized understanding to follow-up with his PCP regarding his kidneys.

## 2021-01-19 ENCOUNTER — Other Ambulatory Visit: Payer: Medicare Other

## 2021-01-19 ENCOUNTER — Ambulatory Visit: Payer: Medicare Other | Admitting: Hematology

## 2021-01-19 ENCOUNTER — Ambulatory Visit: Payer: Medicare Other

## 2021-01-26 ENCOUNTER — Other Ambulatory Visit: Payer: Self-pay

## 2021-01-26 ENCOUNTER — Other Ambulatory Visit: Payer: Self-pay | Admitting: Hematology

## 2021-01-26 ENCOUNTER — Encounter: Payer: Self-pay | Admitting: Hematology

## 2021-01-26 ENCOUNTER — Inpatient Hospital Stay (HOSPITAL_BASED_OUTPATIENT_CLINIC_OR_DEPARTMENT_OTHER): Payer: Medicare Other | Admitting: Hematology

## 2021-01-26 ENCOUNTER — Ambulatory Visit: Payer: Medicare Other

## 2021-01-26 ENCOUNTER — Inpatient Hospital Stay: Payer: Medicare Other

## 2021-01-26 VITALS — BP 141/100 | HR 98 | Temp 98.4°F | Resp 18 | Wt 316.8 lb

## 2021-01-26 DIAGNOSIS — C187 Malignant neoplasm of sigmoid colon: Secondary | ICD-10-CM | POA: Diagnosis not present

## 2021-01-26 LAB — CBC WITH DIFFERENTIAL (CANCER CENTER ONLY)
Abs Immature Granulocytes: 0.01 10*3/uL (ref 0.00–0.07)
Basophils Absolute: 0 10*3/uL (ref 0.0–0.1)
Basophils Relative: 1 %
Eosinophils Absolute: 0.1 10*3/uL (ref 0.0–0.5)
Eosinophils Relative: 2 %
HCT: 43.5 % (ref 39.0–52.0)
Hemoglobin: 14.4 g/dL (ref 13.0–17.0)
Immature Granulocytes: 0 %
Lymphocytes Relative: 36 %
Lymphs Abs: 1.9 10*3/uL (ref 0.7–4.0)
MCH: 26.1 pg (ref 26.0–34.0)
MCHC: 33.1 g/dL (ref 30.0–36.0)
MCV: 78.9 fL — ABNORMAL LOW (ref 80.0–100.0)
Monocytes Absolute: 0.6 10*3/uL (ref 0.1–1.0)
Monocytes Relative: 12 %
Neutro Abs: 2.7 10*3/uL (ref 1.7–7.7)
Neutrophils Relative %: 49 %
Platelet Count: 306 10*3/uL (ref 150–400)
RBC: 5.51 MIL/uL (ref 4.22–5.81)
RDW: 17.1 % — ABNORMAL HIGH (ref 11.5–15.5)
WBC Count: 5.4 10*3/uL (ref 4.0–10.5)
nRBC: 0 % (ref 0.0–0.2)

## 2021-01-26 LAB — CMP (CANCER CENTER ONLY)
ALT: 23 U/L (ref 0–44)
AST: 20 U/L (ref 15–41)
Albumin: 3.9 g/dL (ref 3.5–5.0)
Alkaline Phosphatase: 53 U/L (ref 38–126)
Anion gap: 10 (ref 5–15)
BUN: 13 mg/dL (ref 8–23)
CO2: 27 mmol/L (ref 22–32)
Calcium: 9.7 mg/dL (ref 8.9–10.3)
Chloride: 102 mmol/L (ref 98–111)
Creatinine: 1.33 mg/dL — ABNORMAL HIGH (ref 0.61–1.24)
GFR, Estimated: 59 mL/min — ABNORMAL LOW (ref >60)
Glucose, Bld: 89 mg/dL (ref 70–99)
Potassium: 3.6 mmol/L (ref 3.5–5.1)
Sodium: 139 mmol/L (ref 135–145)
Total Bilirubin: 0.6 mg/dL (ref 0.3–1.2)
Total Protein: 8.3 g/dL — ABNORMAL HIGH (ref 6.5–8.1)

## 2021-01-26 LAB — FOLATE: Folate: 9.7 ng/mL (ref >5.9)

## 2021-01-26 LAB — CEA (IN HOUSE-CHCC): CEA (CHCC-In House): <1 ng/mL (ref 0.00–5.00)

## 2021-01-26 LAB — VITAMIN B12: Vitamin B-12: 584 pg/mL (ref 180–914)

## 2021-01-26 NOTE — Progress Notes (Signed)
Eric Macdonald   Telephone:(336) [phone] Fax:(336) [phone]   Clinic Follow up Note   Patient Care Team: Kristie Cowman, MD as PCP - General (Family Medicine) Alla Feeling, NP as Nurse Practitioner (Oncology) Truitt Merle, MD as Consulting Physician (Hematology and Oncology) Leighton Ruff, MD as Consulting Physician (General Surgery)  Date of Service:  01/26/2021  CHIEF COMPLAINT: f/u of colon cancer  CURRENT THERAPY:  Surveillance  ASSESSMENT & PLAN:  Eric Macdonald is a 65 y.o. male with   1.  Adenocarcinoma of the sigmoid colon, moderately differentiated G2, pT3pN0M0 stage IIA, MMR normal, MSI-stable -He presented with 2-6 months bloody diarrhea, fatigue, and 20 lbs weight loss. 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 Baseline CEA mildly elevated 6.1, staging work up negative for metastatic disease.  -S/p partial colectomy 06/20/20 by Dr. Marcello Moores, surgical path showed 12.7 cm carcinoma was removed, 0/27 LNs+, margins clear. There were no high risk features such as perforation, PNI, or LVI. -Given stage II colon cancer, adjuvant chemotherapy is not recommended. -Due to his moderate recurrence risk, he has been on surveillance, ctDNA not detected on 07/21/20, 08/25/20, and 10/27/20 -he is clinically doing well in terms of his colon cancer. Labs reviewed, CBC and CMP overall stable. Other labs pending. Physical exam unremarkable. -continue surveillance plan which includes lab and f/up q3-4 months for 2-3 years then q6-12 months for 5 years of surveillance. Anticipate colonoscopy and surveillance CT 05/2021 -today's [name] is pending    2. Weight Gain, Left Knee Pain -he is now gaining weight due to difficulties with his left knee. He has gained ~50 lbs since his surgery.   3. Anemia, secondary to tumor bleeding; L87 and folic acid deficiency  -Hgb 9.5 on 3/31 pre-op; MCV 69 with thrombocytosis. Likely secondary to tumor bleeding -work up showed serum iron  34, TIBC 144, ferritin 125; he was found to have low folate 3.5 and low B12 103 -he denies previous abdominal surgeries, intrinsic factor is normal. B12 and FA deficiencies might be from alcohol use.  -He is Jehovah's witness, does not accept blood products.  -He began oral iron and B12 with monthly B12 injections as well.  -10/27/20 B12 and folate normal     PLAN: -f/u in 4-5 months with lab and restaging CT several days before   No problem-specific Assessment & Plan notes found for this encounter.   SUMMARY OF ONCOLOGIC HISTORY: Oncology History  Cancer of sigmoid colon ([name])  05/15/2020 Procedure   Colonoscopy by Dr. [name]: obstructing mass found in the sigmoid colon located 35 cm from the anus.  Unable to advance past the mass.  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  No metastatic disease in the chest, abdomen, pelvis   06/12/2020 Tumor Marker   Baseline CEA 6.1   06/20/2020 Initial Diagnosis   Cancer of sigmoid colon ([name])   06/20/2020 Cancer Staging   Staging form: Colon and Rectum, AJCC 8th Edition - Pathologic stage from 06/20/2020: Stage IIA (pT3, pN0, cM0) - Signed by Alla Feeling, NP on 07/21/2020 Stage prefix: Initial diagnosis Total positive nodes: 0 Histologic grading  system: 4 grade system Histologic grade (G): G2    06/20/2020 Definitive Surgery   Laparoscopic partial colectomy, small bowel resection by Dr. Marcello Moores   06/20/2020 Pathology Results   FINAL MICROSCOPIC DIAGNOSIS:   A. COLON, SIGMOID AND ATTACHED SMALL BOWEL, RESECTION:  -  Adenocarcinoma,  moderately differentiated, 12.7 cm  -  No carcinoma identified in twenty-four lymph nodes (0/24)  -  Margins uninvolved by carcinoma (0.2 cm; mesenteric margin)  -  Sessile serrated polyp  -  See oncology table and comment below   B. COLON, ADDITIONAL PROXIMAL MARGIN, RESECTION:  -  No residual adenocarcinoma identified  -  No carcinoma identified in three lymph nodes (0/3)   MMR normal, MSI-stable      INTERVAL HISTORY:  Eric Macdonald is here for a follow up of colon cancer. He was last seen by NP [name] on 10/27/20. He presents to the clinic alone. He reports he is having left knee pain. He notes he underwent surgery to his right knee years ago (chart review shows this was 2013), which helped greatly. He notes this is effecting his activity level-- "I used to walk 6 miles." He notes he recently returned from visiting his daughter in [place], and he had difficulty with the stairs to her apartment. 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   All other systems were reviewed with the patient and are negative.  MEDICAL HISTORY:  Past Medical History:  Diagnosis Date   Arthritis    Cancer ([place])    colon   Chronic low back pain    Is supposed to have surgery soon at L4-5   Hypertension    Knee pain     SURGICAL HISTORY: Past Surgical History:  Procedure Laterality Date   JOINT REPLACEMENT     knee 05/2011   KNEE SURGERY  08   rt arthroscopy   LAPAROSCOPIC PARTIAL COLECTOMY N/A 06/20/2020   Procedure: LAPAROSCOPIC PARTIAL COLECTOMY;  Surgeon: Leighton Ruff, MD;  Location: [facility];  Service: General;  Laterality: N/A;   TOTAL KNEE ARTHROPLASTY  05/05/2011   Procedure: TOTAL KNEE ARTHROPLASTY;  Surgeon: Ninetta Lights, MD;  Location: [facility];  Service: Orthopedics;  Laterality: Right;  DR MURPHY WANTS 90  MINUTES FOR THIS CASE    I have reviewed the social history and family history with the patient and they are unchanged from previous note.  ALLERGIES:  is allergic to other.  MEDICATIONS:  Current Outpatient Medications  Medication Sig Dispense Refill   acetaminophen (TYLENOL) 500 MG tablet Take 2 tablets (1,000 mg total) by mouth every 6 (six) hours. 30 tablet 0   amLODipine (NORVASC) 10 MG tablet Take 10 mg by mouth daily.     hydrochlorothiazide (MICROZIDE) 12.5 MG capsule Take 12.5 mg by mouth daily.     losartan (COZAAR) 100 MG tablet Take 100 mg by mouth daily.     traMADol (ULTRAM) 50 MG tablet Take 1-2 tablets (50-100 mg total) by mouth every 6 (six) hours as needed for moderate pain or severe pain. 30 tablet 0   No current facility-administered medications for this visit.    PHYSICAL EXAMINATION: ECOG PERFORMANCE STATUS: 1 - Symptomatic but completely ambulatory  Vitals:   01/26/21 1441  BP: (!) 141/100  Pulse: 98  Resp: 18  Temp: 98.4 F (36.9 C)  SpO2: 95%   Wt Readings from Last 3 Encounters:  01/26/21 (!) 316 lb 12.8 oz (143.7 kg)  10/27/20 299 lb 3.2 oz (135.7 kg)  07/21/20 276 lb 9.6 oz (125.5 kg)     GENERAL:alert, no distress and comfortable SKIN: skin color, texture, turgor are normal, no rashes or significant lesions EYES: normal, Conjunctiva are pink and non-injected, sclera clear  NECK: supple, thyroid normal size, non-tender, without nodularity LYMPH:  no palpable lymphadenopathy in the cervical, axillary  LUNGS: clear to auscultation and percussion with normal breathing effort HEART: regular rate & rhythm and no murmurs and no lower extremity edema ABDOMEN:abdomen soft, non-tender and normal bowel sounds Musculoskeletal:no cyanosis of digits and no clubbing  NEURO: alert & oriented x 3 with fluent speech, no focal motor/sensory deficits  LABORATORY DATA:  I have reviewed the data as listed CBC Latest Ref Rng & Units 01/26/2021 01/13/2021  11/24/2020  WBC 4.0 - 10.5 K/uL 5.4 5.8 4.5  Hemoglobin 13.0 - 17.0 g/dL 14.4 13.1 13.4  Hematocrit 39.0 - 52.0 % 43.5 41.3 40.0  Platelets 150 - 400 K/uL 306 339 283     CMP Latest Ref Rng & Units 01/26/2021 01/13/2021 11/24/2020  Glucose 70 - 99 mg/dL 89 132(H) 99  BUN 8 - 23 mg/dL 13 18 12   Creatinine 0.61 - 1.24 mg/dL 1.33(H) 1.37(H) 1.38(H)  Sodium 135 - 145 mmol/L 139 140 141  Potassium 3.5 - 5.1 mmol/L 3.6 3.9 4.4  Chloride 98 - 111 mmol/L 102 104 104  CO2 22 - 32 mmol/L 27 29 29   Calcium 8.9 - 10.3 mg/dL 9.7 9.1 9.4  Total Protein 6.5 - 8.1 g/dL 8.3(H) 7.8 7.6  Total Bilirubin 0.3 - 1.2 mg/dL 0.6 0.4 0.6  Alkaline Phos 38 - 126 U/L 53 51 50  AST 15 - 41 U/L 20 16 28   ALT 0 - 44 U/L 23 20 22       RADIOGRAPHIC STUDIES: I have personally reviewed the radiological images as listed and agreed with the findings in the report. No results found.    Orders Placed This Encounter  Procedures   CT CHEST ABDOMEN PELVIS W CONTRAST    Hold iv contrast if EGFR<50    Standing Status:   Future    Standing Expiration Date:   01/26/2022    Order Specific Question:   Preferred imaging location?    Answer:   [facility]    Order Specific Question:   Is Oral Contrast requested for this exam?    Answer:   Yes, Per Radiology protocol   All questions were answered. The patient knows to call the clinic with any problems, questions or concerns. No barriers to learning was detected. The total time spent in the appointment was 30 minutes.     Truitt Merle, MD 01/26/2021   I, Wilburn Mylar, am acting as scribe for Truitt Merle, MD.   I have reviewed the above documentation for accuracy and completeness, and I agree with the above.

## 2021-01-27 ENCOUNTER — Telehealth: Payer: Self-pay | Admitting: *Deleted

## 2021-01-27 LAB — FOLATE RBC
Folate, Hemolysate: 390 ng/mL
Folate, RBC: 878 ng/mL (ref >498)
Hematocrit: 44.4 % (ref 37.5–51.0)

## 2021-01-27 NOTE — Telephone Encounter (Signed)
Per Dr.Feng, called pt with message below. Pt verbalized understanding.

## 2021-01-27 NOTE — Telephone Encounter (Signed)
-----   Message from Alla Feeling, NP sent at 01/27/2021 11:33 AM EST ----- Please let pt know CBC, B12, folate, and CEA are normal/unremarkable. CMP shows mild kidney dysfunction, f/up with PCP for that and hydrate. We will contact him with the guardant reveal result.  Thanks, Regan Rakers, NP

## 2021-02-03 ENCOUNTER — Telehealth: Payer: Self-pay | Admitting: Hematology

## 2021-02-03 NOTE — Telephone Encounter (Signed)
Scheduled follow-up appointments per 11/14 los. Patient is aware. Mailed calendar.

## 2021-02-06 ENCOUNTER — Telehealth: Payer: Self-pay

## 2021-02-06 NOTE — Telephone Encounter (Signed)
Spoke with pt via telephone regarding his recent [place] test results.  Informed pt that his test results were negative.  Pt has no further questions or concerns at this time.

## 2021-02-17 ENCOUNTER — Other Ambulatory Visit: Payer: Self-pay

## 2021-02-17 ENCOUNTER — Inpatient Hospital Stay: Payer: Medicare Other | Attending: Nurse Practitioner

## 2021-02-17 DIAGNOSIS — E538 Deficiency of other specified B group vitamins: Secondary | ICD-10-CM | POA: Insufficient documentation

## 2021-02-17 DIAGNOSIS — C187 Malignant neoplasm of sigmoid colon: Secondary | ICD-10-CM | POA: Diagnosis present

## 2021-02-17 DIAGNOSIS — D519 Vitamin B12 deficiency anemia, unspecified: Secondary | ICD-10-CM

## 2021-02-17 MED ORDER — CYANOCOBALAMIN 1000 MCG/ML IJ SOLN
1000.0000 ug | Freq: Once | INTRAMUSCULAR | Status: AC
  Administered 2021-02-17: 1000 ug via INTRAMUSCULAR
  Filled 2021-02-17: qty 1

## 2021-02-19 LAB — GUARDANT 360

## 2021-03-23 ENCOUNTER — Other Ambulatory Visit: Payer: Self-pay

## 2021-03-23 NOTE — Progress Notes (Signed)
Spoke with pt via telephone to give him his [place] results.  Pt's [place] Reveal results were reviewed by Dr. Burr Medico and they were negative.  Pt had no further questions or concerns at this time.

## 2021-05-22 ENCOUNTER — Ambulatory Visit (HOSPITAL_COMMUNITY)
Admission: RE | Admit: 2021-05-22 | Discharge: 2021-05-22 | Disposition: A | Discharge status: Home / Self Care | Payer: Medicare Other | Source: Ambulatory Visit | Attending: Hematology | Admitting: Hematology

## 2021-05-22 ENCOUNTER — Encounter: Payer: Self-pay | Admitting: Hematology

## 2021-05-22 ENCOUNTER — Inpatient Hospital Stay: Payer: Medicare Other | Attending: Nurse Practitioner

## 2021-05-22 ENCOUNTER — Other Ambulatory Visit: Payer: Self-pay

## 2021-05-22 DIAGNOSIS — C187 Malignant neoplasm of sigmoid colon: Secondary | ICD-10-CM | POA: Insufficient documentation

## 2021-05-22 LAB — CBC WITH DIFFERENTIAL (CANCER CENTER ONLY)
Abs Immature Granulocytes: 0.02 10*3/uL (ref 0.00–0.07)
Basophils Absolute: 0 10*3/uL (ref 0.0–0.1)
Basophils Relative: 1 %
Eosinophils Absolute: 0.2 10*3/uL (ref 0.0–0.5)
Eosinophils Relative: 4 %
HCT: 43.6 % (ref 39.0–52.0)
Hemoglobin: 14.3 g/dL (ref 13.0–17.0)
Immature Granulocytes: 0 %
Lymphocytes Relative: 34 %
Lymphs Abs: 1.9 10*3/uL (ref 0.7–4.0)
MCH: 25.5 pg — ABNORMAL LOW (ref 26.0–34.0)
MCHC: 32.8 g/dL (ref 30.0–36.0)
MCV: 77.9 fL — ABNORMAL LOW (ref 80.0–100.0)
Monocytes Absolute: 0.6 10*3/uL (ref 0.1–1.0)
Monocytes Relative: 11 %
Neutro Abs: 2.9 10*3/uL (ref 1.7–7.7)
Neutrophils Relative %: 50 %
Platelet Count: 307 10*3/uL (ref 150–400)
RBC: 5.6 MIL/uL (ref 4.22–5.81)
RDW: 17.5 % — ABNORMAL HIGH (ref 11.5–15.5)
WBC Count: 5.8 10*3/uL (ref 4.0–10.5)
nRBC: 0 % (ref 0.0–0.2)

## 2021-05-22 LAB — VITAMIN B12: Vitamin B-12: 284 pg/mL (ref 180–914)

## 2021-05-22 LAB — CMP (CANCER CENTER ONLY)
ALT: 24 U/L (ref 0–44)
AST: 20 U/L (ref 15–41)
Albumin: 4.2 g/dL (ref 3.5–5.0)
Alkaline Phosphatase: 55 U/L (ref 38–126)
Anion gap: 6 (ref 5–15)
BUN: 14 mg/dL (ref 8–23)
CO2: 32 mmol/L (ref 22–32)
Calcium: 9.5 mg/dL (ref 8.9–10.3)
Chloride: 102 mmol/L (ref 98–111)
Creatinine: 1.18 mg/dL (ref 0.61–1.24)
GFR, Estimated: >60 mL/min (ref >60)
Glucose, Bld: 115 mg/dL — ABNORMAL HIGH (ref 70–99)
Potassium: 4 mmol/L (ref 3.5–5.1)
Sodium: 140 mmol/L (ref 135–145)
Total Bilirubin: 0.4 mg/dL (ref 0.3–1.2)
Total Protein: 7.8 g/dL (ref 6.5–8.1)

## 2021-05-22 LAB — FOLATE: Folate: 11.2 ng/mL (ref >5.9)

## 2021-05-22 MED ORDER — IOHEXOL 300 MG/ML  SOLN
80.0000 mL | Freq: Once | INTRAMUSCULAR | Status: AC | PRN
  Administered 2021-05-22: 80 mL via INTRAVENOUS

## 2021-05-22 MED ORDER — SODIUM CHLORIDE (PF) 0.9 % IJ SOLN
INTRAMUSCULAR | Status: AC
  Filled 2021-05-22: qty 50

## 2021-05-24 LAB — FOLATE RBC
Folate, Hemolysate: 456 ng/mL
Folate, RBC: 1020 ng/mL (ref >498)
Hematocrit: 44.7 % (ref 37.5–51.0)

## 2021-05-25 ENCOUNTER — Encounter: Payer: Self-pay | Admitting: Hematology

## 2021-05-25 ENCOUNTER — Inpatient Hospital Stay (HOSPITAL_BASED_OUTPATIENT_CLINIC_OR_DEPARTMENT_OTHER): Payer: Medicare Other | Admitting: Hematology

## 2021-05-25 DIAGNOSIS — C187 Malignant neoplasm of sigmoid colon: Secondary | ICD-10-CM

## 2021-05-25 NOTE — Progress Notes (Signed)
[place]   Telephone:(336) [phone] Fax:(336) [phone]   Clinic Follow up Note   Patient Care Team: Kristie Cowman, MD as PCP - General (Family Medicine) Alla Feeling, NP as Nurse Practitioner (Oncology) Truitt Merle, MD as Consulting Physician (Hematology and Oncology) Leighton Ruff, MD as Consulting Physician (General Surgery)  Date of Service:  05/25/2021  I connected with Eric Macdonald on 05/25/2021 at  2:20 PM EDT by telephone visit and verified that I am speaking with the correct person using two identifiers.  I discussed the limitations, risks, security and privacy concerns of performing an evaluation and management service by telephone and the availability of in person appointments. I also discussed with the patient that there may be a patient responsible charge related to this service. The patient expressed understanding and agreed to proceed.   Other persons participating in the visit and their role in the encounter:  none  Patient's location:  home Provider's location:  my office  CHIEF COMPLAINT: f/u of colon cancer  CURRENT THERAPY:  Surveillance  ASSESSMENT & PLAN:  Eric Macdonald is a 66 y.o. male with   1.  Adenocarcinoma of the sigmoid colon, moderately differentiated G2, pT3pN0M0 stage IIA, MMR normal, MSI-stable -He presented with 2-6 months bloody diarrhea, fatigue, and 20 lbs weight loss. 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 Baseline CEA mildly elevated 6.1, staging work up negative for metastatic disease.  -S/p partial colectomy 06/20/20 by Dr. Marcello Moores, surgical path showed 12.7 cm carcinoma was removed, 0/27 LNs+, margins clear. There were no high risk features such as perforation, PNI, or LVI. -Given stage II colon cancer, adjuvant chemotherapy is not recommended. -Due to his moderate recurrence risk, he has been on surveillance, ctDNA not detected on 07/21/20, 08/25/20, 10/27/20, and 01/26/21 -most recent CT CAP from 05/22/21 showed  no convincing evidence of metastatic disease. I reviewed the results with him today. -he is clinically doing well in terms of his colon cancer. Labs from 05/22/21 reviewed, CBC and CMP overall stable.  -continue surveillance. He is due for surveillance colonoscopy now. I advised him to reach back out to his GI.   2. Weight Gain, Fatty Liver -he gained ~50 lbs following his surgery. -he was noted to have diffuse hepatic steatosis with focal fatty sparing on CT CAP on 05/22/21.   3. Anemia, secondary to tumor bleeding; H63 and folic acid deficiency  -Hgb 9.5 on 3/31 pre-op; MCV 69 with thrombocytosis. Likely secondary to tumor bleeding. Work up showed serum iron 34, TIBC 144, ferritin 125; he was found to have low folate 3.5 and low B12 103 -He is Jehovah's witness, does not accept blood products.  -he received B12 injections monthly in 2022. -B12 and folate WNL since 10/2020 -he notes he is not on oral B12. Lab from 05/22/21 showed a drop in his B12. I advised him to restart oral B12 supplement.     PLAN: -Lab and CT scan reviewed, NED  -He will call his gastroenterologist at the [facility] to schedule colonoscopy  -lab and f/u with NP [name] in 4 months   No problem-specific Assessment & Plan notes found for this encounter.   SUMMARY OF ONCOLOGIC HISTORY: Oncology History Overview Note   Cancer Staging  Cancer of sigmoid colon [facility]) Staging form: Colon and Rectum, AJCC 8th Edition - Pathologic stage from 06/20/2020: Stage IIA (pT3, pN0, cM0) - Signed by Alla Feeling, NP on 07/21/2020    Cancer of sigmoid colon ([place])  05/15/2020 Procedure   Colonoscopy by Dr. [name]: obstructing mass found in the sigmoid colon located 35 cm from the anus.  Unable to advance past the mass.  Medium-sized internal hemorrhoids were found   05/15/2020 Initial Biopsy   Sigmoid colon mass biopsy: Invasive moderately differentiated adenocarcinoma with ulceration, appears to be arising from a  tubulovillous adenoma with high-grade dysplasia   05/19/2020 Imaging   Staging CT CAP: Large exophytic mass appears to arise from the sigmoid colon measuring approximately 12.7 x 7.3 x 8.6 cm located in the central and rightward aspect of the abdomen.  No metastatic disease in the chest, abdomen, pelvis   06/12/2020 Tumor Marker   Baseline CEA 6.1   06/20/2020 Initial Diagnosis   Cancer of sigmoid colon ([place])   06/20/2020 Cancer Staging   Staging form: Colon and Rectum, AJCC 8th Edition - Pathologic stage from 06/20/2020: Stage IIA (pT3, pN0, cM0) - Signed by Alla Feeling, NP on 07/21/2020 Stage prefix: Initial diagnosis Total positive nodes: 0 Histologic grading system: 4 grade system Histologic grade (G): G2    06/20/2020 Definitive Surgery   Laparoscopic partial colectomy, small bowel resection by Dr. Marcello Moores   06/20/2020 Pathology Results   FINAL MICROSCOPIC DIAGNOSIS:   A. COLON, SIGMOID AND ATTACHED SMALL BOWEL, RESECTION:  -  Adenocarcinoma, moderately differentiated, 12.7 cm  -  No carcinoma identified in twenty-four lymph nodes (0/24)  -  Margins uninvolved by carcinoma (0.2 cm; mesenteric margin)  -  Sessile serrated polyp  -  See oncology table and comment below   B. COLON, ADDITIONAL PROXIMAL MARGIN, RESECTION:  -  No residual adenocarcinoma identified  -  No carcinoma identified in three lymph nodes (0/3)   MMR normal, MSI-stable   05/22/2021 Imaging   EXAM: CT CHEST, ABDOMEN, AND PELVIS WITH CONTRAST  IMPRESSION: 1. Prior partial sigmoidectomy with anastomotic sutures in the superior pelvis, without evidence of local recurrence/residual disease. 2. No convincing evidence of metastatic disease in the chest, abdomen or pelvis. 3. Prominent left common iliac lymph node measures 8 mm, is nonspecific. Attention on follow-up imaging suggested. 4. Generalized decrease in hepatic parenchymal attenuation with focal geographic areas of relative hyperattenuation,  almost certainly reflects diffuse hepatic steatosis with focal fatty sparing. This could be more definitively characterized with hepatic protocol abdominal MRI with and without contrast if clinically indicated. 5.  Aortic Atherosclerosis (ICD10-I70.0).      INTERVAL HISTORY:  Eric Macdonald was contacted for a follow up of colon cancer. He was last seen by me on 01/26/21. He reports he is doing well overall. He denies loss of appetite, bowel disturbances, or pain.    All other systems were reviewed with the patient and are negative.  MEDICAL HISTORY:  Past Medical History:  Diagnosis Date   Arthritis    Cancer ([place])    colon   Chronic low back pain    Is supposed to have surgery soon at L4-5   Hypertension    Knee pain     SURGICAL HISTORY: Past Surgical History:  Procedure Laterality Date   JOINT REPLACEMENT     knee 05/2011   KNEE SURGERY  08   rt arthroscopy   LAPAROSCOPIC PARTIAL COLECTOMY N/A 06/20/2020   Procedure: LAPAROSCOPIC PARTIAL COLECTOMY;  Surgeon: Leighton Ruff, MD;  Location: [facility];  Service: General;  Laterality: N/A;   TOTAL KNEE ARTHROPLASTY  05/05/2011   Procedure: TOTAL KNEE ARTHROPLASTY;  Surgeon: Ninetta Lights, MD;  Location: [facility];  Service: Orthopedics;  Laterality: Right;  DR MURPHY WANTS 90 MINUTES FOR THIS CASE    I have reviewed the social history and family history with the patient and they are unchanged from previous note.  ALLERGIES:  is allergic to other.  MEDICATIONS:  Current Outpatient Medications  Medication Sig Dispense Refill   acetaminophen (TYLENOL) 500 MG tablet Take 2 tablets (1,000 mg total) by mouth every 6 (six) hours. 30 tablet 0   amLODipine (NORVASC) 10 MG tablet Take 10 mg by mouth daily.     hydrochlorothiazide (MICROZIDE) 12.5 MG capsule Take 12.5 mg by mouth daily.     losartan (COZAAR) 100 MG tablet Take 100 mg by mouth daily.     traMADol (ULTRAM) 50 MG tablet Take 1-2 tablets (50-100 mg total) by mouth every 6  (six) hours as needed for moderate pain or severe pain. 30 tablet 0   No current facility-administered medications for this visit.    PHYSICAL EXAMINATION: ECOG PERFORMANCE STATUS: 0 - Asymptomatic  There were no vitals filed for this visit. Wt Readings from Last 3 Encounters:  01/26/21 (!) 316 lb 12.8 oz (143.7 kg)  10/27/20 299 lb 3.2 oz (135.7 kg)  07/21/20 276 lb 9.6 oz (125.5 kg)     No vitals taken today, Exam not performed today  LABORATORY DATA:  I have reviewed the data as listed CBC Latest Ref Rng & Units 05/22/2021 05/22/2021 01/26/2021  WBC 4.0 - 10.5 K/uL 5.8 - 5.4  Hemoglobin 13.0 - 17.0 g/dL 14.3 - 14.4  Hematocrit 39.0 - 52.0 % 43.6 44.7 43.5  Platelets 150 - 400 K/uL 307 - 306     CMP Latest Ref Rng & Units 05/22/2021 01/26/2021 01/13/2021  Glucose 70 - 99 mg/dL 115(H) 89 132(H)  BUN 8 - 23 mg/dL 14 13 18   Creatinine 0.61 - 1.24 mg/dL 1.18 1.33(H) 1.37(H)  Sodium 135 - 145 mmol/L 140 139 140  Potassium 3.5 - 5.1 mmol/L 4.0 3.6 3.9  Chloride 98 - 111 mmol/L 102 102 104  CO2 22 - 32 mmol/L 32 27 29  Calcium 8.9 - 10.3 mg/dL 9.5 9.7 9.1  Total Protein 6.5 - 8.1 g/dL 7.8 8.3(H) 7.8  Total Bilirubin 0.3 - 1.2 mg/dL 0.4 0.6 0.4  Alkaline Phos 38 - 126 U/L 55 53 51  AST 15 - 41 U/L 20 20 16   ALT 0 - 44 U/L 24 23 20       RADIOGRAPHIC STUDIES: I have personally reviewed the radiological images as listed and agreed with the findings in the report. No results found.    No orders of the defined types were placed in this encounter.  All questions were answered. The patient knows to call the clinic with any problems, questions or concerns. No barriers to learning was detected. The total time spent in the appointment was 22 minutes.      Truitt Merle, MD 05/25/2021   I, Wilburn Mylar, am acting as scribe for Truitt Merle, MD.   I have reviewed the above documentation for accuracy and completeness, and I agree with the above.

## 2021-05-26 ENCOUNTER — Other Ambulatory Visit: Payer: Self-pay | Admitting: *Deleted

## 2021-05-26 ENCOUNTER — Telehealth: Payer: Self-pay | Admitting: Hematology

## 2021-05-26 DIAGNOSIS — C187 Malignant neoplasm of sigmoid colon: Secondary | ICD-10-CM

## 2021-05-26 NOTE — Telephone Encounter (Signed)
Scheduled follow-up appointment per 3/13 los. Patient is aware. Mailed calendar. ?

## 2021-06-03 ENCOUNTER — Other Ambulatory Visit: Payer: Self-pay

## 2021-06-16 LAB — GUARDANT 360

## 2021-07-02 ENCOUNTER — Other Ambulatory Visit: Payer: Self-pay

## 2021-07-02 ENCOUNTER — Telehealth: Payer: Self-pay

## 2021-07-02 NOTE — Telephone Encounter (Signed)
LVM regarding pt's May 22, 2021 Guardant Reveal results.  Informed pt that his Guardant Reveal results are negative.  Instructed pt to contact Dr. Ernestina Penna office should he have additional questions or concerns. ?

## 2021-09-20 NOTE — Progress Notes (Deleted)
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 [name]: obstructing mass found in the sigmoid colon located 35 cm from the anus.  Unable to advance past the mass.  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  No metastatic disease in the chest, abdomen, pelvis   06/12/2020 Tumor Marker   Baseline CEA 6.1   06/20/2020 Initial Diagnosis   Cancer of sigmoid colon ([place])   06/20/2020 Cancer Staging   Staging form: Colon and Rectum, AJCC 8th Edition - Pathologic stage from 06/20/2020: Stage IIA (pT3, pN0, cM0) - Signed by Alla Feeling, NP on 07/21/2020 Stage prefix: Initial diagnosis Total positive nodes: 0 Histologic grading system: 4 grade system Histologic grade (G): G2   06/20/2020 Definitive Surgery   Laparoscopic partial colectomy, small bowel resection by Dr. Marcello Moores   06/20/2020  Pathology Results   FINAL MICROSCOPIC DIAGNOSIS:   A. COLON, SIGMOID AND ATTACHED SMALL BOWEL, RESECTION:  -  Adenocarcinoma, moderately differentiated, 12.7 cm  -  No carcinoma identified in twenty-four lymph nodes (0/24)  -  Margins uninvolved by carcinoma (0.2 cm; mesenteric margin)  -  Sessile serrated polyp  -  See oncology table and comment below   B. COLON, ADDITIONAL PROXIMAL MARGIN, RESECTION:  -  No residual adenocarcinoma identified  -  No carcinoma identified in three lymph nodes (0/3)   MMR normal, MSI-stable   05/22/2021 Imaging   EXAM: CT CHEST, ABDOMEN, AND PELVIS WITH CONTRAST  IMPRESSION: 1. Prior partial sigmoidectomy with anastomotic sutures in the superior pelvis, without evidence of local recurrence/residual disease. 2. No convincing evidence of metastatic disease in the chest, abdomen or pelvis. 3. Prominent left common iliac lymph node measures 8 mm, is nonspecific. Attention on follow-up imaging suggested. 4. Generalized decrease in hepatic parenchymal attenuation with focal geographic areas of relative hyperattenuation, almost certainly reflects diffuse hepatic steatosis with focal fatty sparing. This could be more definitively characterized with hepatic protocol abdominal MRI with and without contrast if clinically indicated. 5.  Aortic Atherosclerosis (ICD10-I70.0).     CURRENT THERAPY: Surveillance   INTERVAL HISTORY: Mr. Crisman returns for follow up as scheduled. Last seen by Dr. Burr Medico 05/25/21, ctDNA remained not detectable. He has/has not *** undergone surveillance colonoscopy.    REVIEW OF SYSTEMS:   Constitutional: Denies fevers, chills or abnormal weight loss Eyes: Denies blurriness of vision Ears, nose, mouth, throat, and face: Denies mucositis or sore throat Respiratory: Denies cough, dyspnea or wheezes  Cardiovascular: Denies palpitation, chest discomfort or lower extremity swelling Gastrointestinal:  Denies nausea, heartburn or change  in bowel habits Skin: Denies abnormal skin rashes Lymphatics: Denies new lymphadenopathy or easy bruising Neurological:Denies numbness, tingling or new weaknesses Behavioral/Psych: Mood is stable, no new changes  All other systems were reviewed with the patient and are negative.  MEDICAL HISTORY:  Past Medical History:  Diagnosis Date   Arthritis    Cancer ([place])    colon   Chronic low back pain    Is supposed to have surgery soon at L4-5   Hypertension    Knee pain     SURGICAL HISTORY: Past Surgical History:  Procedure Laterality Date   JOINT REPLACEMENT     knee 05/2011   KNEE SURGERY  08   rt arthroscopy   LAPAROSCOPIC PARTIAL COLECTOMY N/A 06/20/2020   Procedure: LAPAROSCOPIC PARTIAL COLECTOMY;  Surgeon: Leighton Ruff, MD;  Location: [facility];  Service: General;  Laterality: N/A;   TOTAL KNEE ARTHROPLASTY  05/05/2011   Procedure: TOTAL KNEE ARTHROPLASTY;  Surgeon: Ninetta Lights, MD;  Location: [facility];  Service: Orthopedics;  Laterality: Right;  DR MURPHY WANTS 90 MINUTES FOR THIS CASE    I have reviewed the social history and family history with the patient and they are unchanged from previous note.  ALLERGIES:  is allergic to other.  MEDICATIONS:  Current Outpatient Medications  Medication Sig Dispense Refill   acetaminophen (TYLENOL) 500 MG tablet Take 2 tablets (1,000 mg total) by mouth every 6 (six) hours. 30 tablet 0   amLODipine (NORVASC) 10 MG tablet Take 10 mg by mouth daily.     hydrochlorothiazide (MICROZIDE) 12.5 MG capsule Take 12.5 mg by mouth daily.     losartan (COZAAR) 100 MG tablet Take 100 mg by mouth daily.     traMADol (ULTRAM) 50 MG tablet Take 1-2 tablets (50-100 mg total) by mouth every 6 (six) hours as needed for moderate pain or severe pain. 30 tablet 0   No current facility-administered medications for this visit.    PHYSICAL EXAMINATION: ECOG PERFORMANCE STATUS: {CHL ONC ECOG PS:[id number]}  There were no vitals filed for this  visit. There were no vitals filed for this visit.  GENERAL:alert, no distress and comfortable SKIN: skin color, texture, turgor are normal, no rashes or significant lesions EYES: normal, Conjunctiva are pink and non-injected, sclera clear OROPHARYNX:no exudate, no erythema and lips, buccal mucosa, and tongue normal  NECK: supple, thyroid normal size, non-tender, without nodularity LYMPH:  no palpable lymphadenopathy in the cervical, axillary or inguinal LUNGS: clear to auscultation and percussion with normal breathing effort HEART: regular rate & rhythm and no murmurs and no lower extremity edema ABDOMEN:abdomen soft, non-tender and normal bowel sounds Musculoskeletal:no cyanosis of digits and no clubbing  NEURO: alert & oriented x 3 with fluent speech, no focal motor/sensory deficits  LABORATORY DATA:  I have reviewed the data as listed    Latest Ref Rng & Units 05/22/2021    9:40 AM 05/22/2021    9:39 AM 01/26/2021    1:15 PM  CBC  WBC 4.0 - 10.5 K/uL 5.8   5.4   Hemoglobin 13.0 - 17.0 g/dL 14.3   14.4   Hematocrit 39.0 - 52.0 % 43.6  44.7  44.4    43.5   Platelets 150 - 400 K/uL 307   306         Latest Ref Rng & Units 05/22/2021    9:40 AM 01/26/2021    1:15 PM  01/13/2021   10:10 AM  CMP  Glucose 70 - 99 mg/dL 115  89  132   BUN 8 - 23 mg/dL 14  13  18    Creatinine 0.61 - 1.24 mg/dL 1.18  1.33  1.37   Sodium 135 - 145 mmol/L 140  139  140   Potassium 3.5 - 5.1 mmol/L 4.0  3.6  3.9   Chloride 98 - 111 mmol/L 102  102  104   CO2 22 - 32 mmol/L 32  27  29   Calcium 8.9 - 10.3 mg/dL 9.5  9.7  9.1   Total Protein 6.5 - 8.1 g/dL 7.8  8.3  7.8   Total Bilirubin 0.3 - 1.2 mg/dL 0.4  0.6  0.4   Alkaline Phos 38 - 126 U/L 55  53  51   AST 15 - 41 U/L 20  20  16    ALT 0 - 44 U/L 24  23  20        RADIOGRAPHIC STUDIES: I have personally reviewed the radiological images as listed and agreed with the findings in the report. No results found.   ASSESSMENT & PLAN:  No  problem-specific Assessment & Plan notes found for this encounter.   No orders of the defined types were placed in this encounter.  All questions were answered. The patient knows to call the clinic with any problems, questions or concerns. No barriers to learning was detected. I spent {CHL ONC TIME VISIT - FMZUA:[id number]} counseling the patient face to face. The total time spent in the appointment was {CHL ONC TIME VISIT - VUFCZ:[id number]} and more than 50% was on counseling and review of test results     Alla Feeling, NP 09/20/21

## 2021-09-23 ENCOUNTER — Other Ambulatory Visit: Payer: Self-pay

## 2021-09-23 DIAGNOSIS — C187 Malignant neoplasm of sigmoid colon: Secondary | ICD-10-CM

## 2021-09-23 DIAGNOSIS — D519 Vitamin B12 deficiency anemia, unspecified: Secondary | ICD-10-CM

## 2021-09-24 ENCOUNTER — Inpatient Hospital Stay: Payer: Medicare Other

## 2021-09-24 ENCOUNTER — Inpatient Hospital Stay: Payer: Medicare Other | Attending: Nurse Practitioner | Admitting: Nurse Practitioner

## 2021-09-25 ENCOUNTER — Telehealth: Payer: Self-pay | Admitting: Nurse Practitioner

## 2021-09-25 NOTE — Telephone Encounter (Signed)
.  Called patient to schedule appointment per 7/14 inbasket, patient is aware of date and time.

## 2021-10-18 NOTE — Progress Notes (Deleted)
[place]   Telephone:(336) [phone] Fax:(336) [phone]   Clinic Follow up Note   Patient Care Team: Kristie Cowman, MD as PCP - General (Family Medicine) Alla Feeling, NP as Nurse Practitioner (Oncology) Truitt Merle, MD as Consulting Physician (Hematology and Oncology) Leighton Ruff, MD as Consulting Physician (General Surgery) 10/18/2021  CHIEF COMPLAINT: Follow up colon cancer   SUMMARY OF ONCOLOGIC HISTORY: Oncology History Overview Note   Cancer Staging  Cancer of sigmoid colon [facility]) Staging form: Colon and Rectum, AJCC 8th Edition - Pathologic stage from 06/20/2020: Stage IIA (pT3, pN0, cM0) - Signed by Alla Feeling, NP on 07/21/2020    Cancer of sigmoid colon ([place])  05/15/2020 Procedure   Colonoscopy by Dr. [name]: obstructing mass found in the sigmoid colon located 35 cm from the anus.  Unable to advance past the mass.  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  No metastatic disease in the chest, abdomen, pelvis   06/12/2020 Tumor Marker   Baseline CEA 6.1   06/20/2020 Initial Diagnosis   Cancer of sigmoid colon ([name])   06/20/2020 Cancer Staging   Staging form: Colon and Rectum, AJCC 8th Edition - Pathologic stage from 06/20/2020: Stage IIA (pT3, pN0, cM0) - Signed by Alla Feeling, NP on 07/21/2020 Stage prefix: Initial diagnosis Total positive nodes: 0 Histologic grading system: 4 grade system Histologic grade (G): G2   06/20/2020 Definitive Surgery   Laparoscopic partial colectomy, small bowel resection by Dr. Marcello Moores   06/20/2020 Pathology  Results   FINAL MICROSCOPIC DIAGNOSIS:   A. COLON, SIGMOID AND ATTACHED SMALL BOWEL, RESECTION:  -  Adenocarcinoma, moderately differentiated, 12.7 cm  -  No carcinoma identified in twenty-four lymph nodes (0/24)  -  Margins uninvolved by carcinoma (0.2 cm; mesenteric margin)  -  Sessile serrated polyp  -  See oncology table and comment below   B. COLON, ADDITIONAL PROXIMAL MARGIN, RESECTION:  -  No residual adenocarcinoma identified  -  No carcinoma identified in three lymph nodes (0/3)   MMR normal, MSI-stable   05/22/2021 Imaging   EXAM: CT CHEST, ABDOMEN, AND PELVIS WITH CONTRAST  IMPRESSION: 1. Prior partial sigmoidectomy with anastomotic sutures in the superior pelvis, without evidence of local recurrence/residual disease. 2. No convincing evidence of metastatic disease in the chest, abdomen or pelvis. 3. Prominent left common iliac lymph node measures 8 mm, is nonspecific. Attention on follow-up imaging suggested. 4. Generalized decrease in hepatic parenchymal attenuation with focal geographic areas of relative hyperattenuation, almost certainly reflects diffuse hepatic steatosis with focal fatty sparing. This could be more definitively characterized with hepatic protocol abdominal MRI with and without contrast if clinically indicated. 5.  Aortic Atherosclerosis (ICD10-I70.0).     CURRENT THERAPY: Surveillance   INTERVAL HISTORY: Mr. Reinig returns for follow up as scheduled. Last seen 05/25/21 for telehealth to review surveillance CT.    REVIEW OF SYSTEMS:   Constitutional: Denies fevers, chills or abnormal weight loss Eyes: Denies blurriness of vision Ears, nose, mouth, throat, and face: Denies mucositis or sore throat Respiratory: Denies cough, dyspnea or wheezes Cardiovascular: Denies palpitation, chest discomfort or lower extremity swelling  Gastrointestinal:  Denies nausea, heartburn or change in bowel habits Skin: Denies abnormal skin rashes Lymphatics:  Denies new lymphadenopathy or easy bruising Neurological:Denies numbness, tingling or new weaknesses Behavioral/Psych: Mood is stable, no new changes  All other systems were reviewed with the patient and are negative.  MEDICAL HISTORY:  Past Medical History:  Diagnosis Date   Arthritis    Cancer ([place])    colon   Chronic low back pain    Is supposed to have surgery soon at L4-5   Hypertension    Knee pain     SURGICAL HISTORY: Past Surgical History:  Procedure Laterality Date   JOINT REPLACEMENT     knee 05/2011   KNEE SURGERY  08   rt arthroscopy   LAPAROSCOPIC PARTIAL COLECTOMY N/A 06/20/2020   Procedure: LAPAROSCOPIC PARTIAL COLECTOMY;  Surgeon: Leighton Ruff, MD;  Location: [facility];  Service: General;  Laterality: N/A;   TOTAL KNEE ARTHROPLASTY  05/05/2011   Procedure: TOTAL KNEE ARTHROPLASTY;  Surgeon: Ninetta Lights, MD;  Location: [place];  Service: Orthopedics;  Laterality: Right;  DR MURPHY WANTS 90 MINUTES FOR THIS CASE    I have reviewed the social history and family history with the patient and they are unchanged from previous note.  ALLERGIES:  is allergic to other.  MEDICATIONS:  Current Outpatient Medications  Medication Sig Dispense Refill   acetaminophen (TYLENOL) 500 MG tablet Take 2 tablets (1,000 mg total) by mouth every 6 (six) hours. 30 tablet 0   amLODipine (NORVASC) 10 MG tablet Take 10 mg by mouth daily.     hydrochlorothiazide (MICROZIDE) 12.5 MG capsule Take 12.5 mg by mouth daily.     losartan (COZAAR) 100 MG tablet Take 100 mg by mouth daily.     traMADol (ULTRAM) 50 MG tablet Take 1-2 tablets (50-100 mg total) by mouth every 6 (six) hours as needed for moderate pain or severe pain. 30 tablet 0   No current facility-administered medications for this visit.    PHYSICAL EXAMINATION: ECOG PERFORMANCE STATUS: {CHL ONC ECOG [phone]}  There were no vitals filed for this visit. There were no vitals filed for this visit.  GENERAL:alert, no  distress and comfortable SKIN: skin color, texture, turgor are normal, no rashes or significant lesions EYES: normal, Conjunctiva are pink and non-injected, sclera clear OROPHARYNX:no exudate, no erythema and lips, buccal mucosa, and tongue normal  NECK: supple, thyroid normal size, non-tender, without nodularity LYMPH:  no palpable lymphadenopathy in the cervical, axillary or inguinal LUNGS: clear to auscultation and percussion with normal breathing effort HEART: regular rate & rhythm and no murmurs and no lower extremity edema ABDOMEN:abdomen soft, non-tender and normal bowel sounds Musculoskeletal:no cyanosis of digits and no clubbing  NEURO: alert & oriented x 3 with fluent speech, no focal motor/sensory deficits  LABORATORY DATA:  I have reviewed the data as listed    Latest Ref Rng & Units 05/22/2021    9:40 AM 05/22/2021    9:39 AM 01/26/2021    1:15 PM  CBC  WBC 4.0 - 10.5 K/uL 5.8   5.4   Hemoglobin 13.0 - 17.0 g/dL 14.3   14.4   Hematocrit 39.0 - 52.0 % 43.6  44.7  44.4    43.5   Platelets 150 - 400 K/uL 307   306         Latest Ref Rng & Units 05/22/2021    9:40 AM 01/26/2021    1:15 PM 01/13/2021   10:10 AM  CMP  Glucose  70 - 99 mg/dL 115  89  132   BUN 8 - 23 mg/dL _0 Creatinine 0.61 - 1.24 mg/dL 1.18  1.33  1.37   Sodium 135 - 145 mmol/L 140  139  140   Potassium 3.5 - 5.1 mmol/L 4.0  3.6  3.9   Chloride 98 - 111 mmol/L 102  102  104   CO2 22 - 32 mmol/L 32  27  29   Calcium 8.9 - 10.3 mg/dL 9.5  9.7  9.1   Total Protein 6.5 - 8.1 g/dL 7.8  8.3  7.8   Total Bilirubin 0.3 - 1.2 mg/dL 0.4  0.6  0.4   Alkaline Phos 38 - 126 U/L 55  53  51   AST 15 - 41 U/L _1 ALT 0 - 44 U/L _2 RADIOGRAPHIC STUDIES: I have personally reviewed the radiological images as listed and agreed with the findings in the report. No results found.   ASSESSMENT & PLAN:  No problem-specific Assessment & Plan notes found for this encounter.   No  orders of the defined types were placed in this encounter.  All questions were answered. The patient knows to call the clinic with any problems, questions or concerns. No barriers to learning was detected. I spent {CHL ONC TIME VISIT - JDBZM:[id number]} counseling the patient face to face. The total time spent in the appointment was {CHL ONC TIME VISIT - AESLP:[id number]} and more than 50% was on counseling and review of test results     Alla Feeling, NP 10/18/21

## 2021-10-20 ENCOUNTER — Inpatient Hospital Stay: Payer: Medicare Other | Admitting: Nurse Practitioner

## 2021-10-20 ENCOUNTER — Inpatient Hospital Stay: Payer: Medicare Other | Attending: Nurse Practitioner

## 2021-11-03 ENCOUNTER — Telehealth: Payer: Self-pay | Admitting: Nurse Practitioner

## 2021-11-03 NOTE — Telephone Encounter (Signed)
Scheduled per 8/22 in basket, message has been left  

## 2021-11-16 ENCOUNTER — Other Ambulatory Visit: Payer: Self-pay | Admitting: Nurse Practitioner

## 2021-11-16 DIAGNOSIS — C187 Malignant neoplasm of sigmoid colon: Secondary | ICD-10-CM

## 2021-11-16 NOTE — Progress Notes (Addendum)
[place]   [phone] [phone]   Clinic Follow up Note   Patient Care Team: Kristie Cowman, MD as PCP - General (Family Medicine) Alla Feeling, NP as Nurse Practitioner (Oncology) Truitt Merle, MD as Consulting Physician (Hematology and Oncology) Leighton Ruff, MD as Consulting Physician (General Surgery) 11/17/2021  CHIEF COMPLAINT: Follow up colon cancer   SUMMARY OF ONCOLOGIC HISTORY: Oncology History Overview Note   Cancer Staging  Cancer of sigmoid colon [facility]) Staging form: Colon and Rectum, AJCC 8th Edition - Pathologic stage from 06/20/2020: Stage IIA (pT3, pN0, cM0) - Signed by Alla Feeling, NP on 07/21/2020    Cancer of sigmoid colon ([place])  05/15/2020 Procedure   Colonoscopy by Dr. [name]: obstructing mass found in the sigmoid colon located 35 cm from the anus.  Unable to advance past the mass.  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  No metastatic disease in the chest, abdomen, pelvis   06/12/2020 Tumor Marker   Baseline CEA 6.1   06/20/2020 Initial Diagnosis   Cancer of sigmoid colon ([place])   06/20/2020 Cancer Staging   Staging form: Colon and Rectum, AJCC 8th Edition - Pathologic stage from 06/20/2020: Stage IIA (pT3, pN0, cM0) - Signed by Alla Feeling, NP on 07/21/2020 Stage prefix: Initial diagnosis Total positive nodes: 0 Histologic grading system: 4 grade system Histologic grade (G): G2   06/20/2020 Definitive Surgery   Laparoscopic partial colectomy, small bowel resection by Dr. Marcello Moores   06/20/2020 Pathology  Results   FINAL MICROSCOPIC DIAGNOSIS:   A. COLON, SIGMOID AND ATTACHED SMALL BOWEL, RESECTION:  -  Adenocarcinoma, moderately differentiated, 12.7 cm  -  No carcinoma identified in twenty-four lymph nodes (0/24)  -  Margins uninvolved by carcinoma (0.2 cm; mesenteric margin)  -  Sessile serrated polyp  -  See oncology table and comment below   B. COLON, ADDITIONAL PROXIMAL MARGIN, RESECTION:  -  No residual adenocarcinoma identified  -  No carcinoma identified in three lymph nodes (0/3)   MMR normal, MSI-stable   05/22/2021 Imaging   EXAM: CT CHEST, ABDOMEN, AND PELVIS WITH CONTRAST  IMPRESSION: 1. Prior partial sigmoidectomy with anastomotic sutures in the superior pelvis, without evidence of local recurrence/residual disease. 2. No convincing evidence of metastatic disease in the chest, abdomen or pelvis. 3. Prominent left common iliac lymph node measures 8 mm, is nonspecific. Attention on follow-up imaging suggested. 4. Generalized decrease in hepatic parenchymal attenuation with focal geographic areas of relative hyperattenuation, almost certainly reflects diffuse hepatic steatosis with focal fatty sparing. This could be more definitively characterized with hepatic protocol abdominal MRI with and without contrast if clinically indicated. 5.  Aortic Atherosclerosis (ICD10-I70.0).     CURRENT THERAPY: Surveillance   INTERVAL HISTORY: Mr. Altier returns for follow up, last seen by Dr. Annamaria Boots 05/25/2021.  He rescheduled visits from July and August because colonoscopy had not been done, this is scheduled for Friday 9/8.  He presents today by himself.  He feels well in general, good quality of life but sounds like some survivors guilt.  He has not been exercising as much due to  left knee pain.  Plans to start aqua aerobics soon.  Appetite is normal.  Denies change in bowel function, abdominal pain/bloating, hematochezia, fatigue, or unintentional weight loss.  He has not been on oral  or B12 inj since December 2022.  All other systems were reviewed with the patient and are negative.  MEDICAL HISTORY:  Past Medical History:  Diagnosis Date   Arthritis    Cancer ([place])    colon   Chronic low back pain    Is supposed to have surgery soon at [facility]   Hypertension    Knee pain     SURGICAL HISTORY: Past Surgical History:  Procedure Laterality Date   JOINT REPLACEMENT     knee 05/2011   KNEE SURGERY  08   rt arthroscopy   LAPAROSCOPIC PARTIAL COLECTOMY N/A 06/20/2020   Procedure: LAPAROSCOPIC PARTIAL COLECTOMY;  Surgeon: Leighton Ruff, MD;  Location: [facility];  Service: General;  Laterality: N/A;   TOTAL KNEE ARTHROPLASTY  05/05/2011   Procedure: TOTAL KNEE ARTHROPLASTY;  Surgeon: Ninetta Lights, MD;  Location: [facility];  Service: Orthopedics;  Laterality: Right;  DR MURPHY WANTS 90 MINUTES FOR THIS CASE    I have reviewed the social history and family history with the patient and they are unchanged from previous note.  ALLERGIES:  is allergic to other.  MEDICATIONS:  Current Outpatient Medications  Medication Sig Dispense Refill   acetaminophen (TYLENOL) 500 MG tablet Take 2 tablets (1,000 mg total) by mouth every 6 (six) hours. 30 tablet 0   amLODipine (NORVASC) 10 MG tablet Take 10 mg by mouth daily.     hydrochlorothiazide (MICROZIDE) 12.5 MG capsule Take 12.5 mg by mouth daily.     losartan (COZAAR) 100 MG tablet Take 100 mg by mouth daily.     traMADol (ULTRAM) 50 MG tablet Take 1-2 tablets (50-100 mg total) by mouth every 6 (six) hours as needed for moderate pain or severe pain. 30 tablet 0   No current facility-administered medications for this visit.    PHYSICAL EXAMINATION: ECOG PERFORMANCE STATUS: 0 - Asymptomatic  Vitals:   11/17/21 1102  BP: (!) 160/97  Pulse: 86  Resp: 18  Temp: 98.8 F (37.1 C)  SpO2: 98%   Filed Weights   11/17/21 1102  Weight: (!) 329 lb 8 oz (149.5 kg)    GENERAL:alert, no distress and comfortable SKIN: No  rash EYES: sclera clear LUNGS: clear with normal breathing effort HEART: regular rate & rhythm, mild bilateral lower extremity edema ABDOMEN:abdomen soft, non-tender and normal bowel sounds NEURO: alert & oriented x 3 with fluent speech, no focal motor/sensory deficits   LABORATORY DATA:  I have reviewed the data as listed    Latest Ref Rng & Units 11/17/2021   10:42 AM 05/22/2021    9:40 AM 05/22/2021    9:39 AM  CBC  WBC 4.0 - 10.5 K/uL 5.9  5.8    Hemoglobin 13.0 - 17.0 g/dL 14.7  14.3    Hematocrit 39.0 - 52.0 % 44.0  43.6  44.7   Platelets 150 - 400 K/uL 293  307          Latest Ref Rng & Units 11/17/2021   10:42 AM 05/22/2021    9:40 AM 01/26/2021    1:15 PM  CMP  Glucose 70 - 99 mg/dL 165  115  89   BUN 8 - 23 mg/dL 15  14  13    Creatinine 0.61 - 1.24 mg/dL 1.14  1.18  1.33  Sodium 135 - 145 mmol/L 138  140  139   Potassium 3.5 - 5.1 mmol/L 3.5  4.0  3.6   Chloride 98 - 111 mmol/L 103  102  102   CO2 22 - 32 mmol/L 29  32  27   Calcium 8.9 - 10.3 mg/dL 9.3  9.5  9.7   Total Protein 6.5 - 8.1 g/dL 7.8  7.8  8.3   Total Bilirubin 0.3 - 1.2 mg/dL 0.4  0.4  0.6   Alkaline Phos 38 - 126 U/L 50  55  53   AST 15 - 41 U/L 16  20  20    ALT 0 - 44 U/L 18  24  23        RADIOGRAPHIC STUDIES: I have personally reviewed the radiological images as listed and agreed with the findings in the report. No results found.   ASSESSMENT & PLAN: CYPRESS FANFAN is a 66 y.o. male with    1.  Adenocarcinoma of the sigmoid colon, moderately differentiated G2, pT3pN0M0 stage IIA, MMR normal, MSI-stable -He presented with 2-6 months bloody diarrhea, fatigue, and 20 lbs weight loss. 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 Baseline CEA mildly elevated 6.1, staging work up negative for metastatic disease.  -S/p partial colectomy 06/20/20 by Dr. Marcello Moores, surgical path showed 12.7 cm carcinoma was removed, 0/27 LNs+, margins clear. There were no high risk features such as perforation,  PNI, or LVI. -Given stage II colon cancer, adjuvant chemotherapy is not recommended. -The recurrence risk is moderate.   -ctDNA not detected for the first year after definitive surgery (07/21/2020, 08/25/2020, 10/27/2020, 01/26/2021 and 05/25/2021) -Surveillance CT 05/22/2021 showed no definitive evidence of recurrence -Mr. Gabrielson is clinically doing well on surveillance.  No red flags.  Labs are unremarkable, exam is benign.  -He will proceed with colonoscopy 11/20/2021 at [facility] as scheduled, we will request the report -Continue surveillance, next CT 05/2022, will order at next visit -Follow-up in 4 months, or sooner if needed   2.  Family history, genetics -He tells me that a first cousin and his sister have pancreatic cancer, his cousin recently is deceased.  This was not known at initial consult -I reviewed the guidelines for genetic testing and those with family history of pancreatic cancer.  -Encouraged him to consider genetic testing which she will think about   3. Weight Gain, Fatty Liver -he gained ~50 lbs following his surgery. -he was noted to have diffuse hepatic steatosis with focal fatty sparing on CT CAP on 05/22/21. -He continues to gain weight, he attributes lack of exercise to the left knee arthritis.  He plans to start aqua aerobics at the [facility] aquatic center where he can swim for $2 per visit   4. Anemia, secondary to tumor bleeding; G50 and folic acid deficiency  -Hgb 9.5 on 3/31 pre-op; MCV 69 with thrombocytosis. Likely secondary to tumor bleeding. Work up showed serum iron 34, TIBC 144, ferritin 125; he was found to have low folate 3.5 and low B12 103 -He is Jehovah's witness, does not accept blood products.  -he received B12 injections monthly through 02/2021, switch to oral -B12 and folate WNL since 10/2020 -Currently not on B12 supplement, we will follow-up the pending level from today  Plan: -Labs reviewed, we will follow-up on the pending B12, folate, and  CEA from today -Continue colon cancer surveillance -Colonoscopy 9/8 at [facility] (Dr. [name]) as scheduled, will request report -Patient will consider genetic testing for family  history of pancreatic cancer -Follow-up in 4 months, sooner if needed -Next surveillance CT 05/2022, will order at next visit    All questions were answered. The patient knows to call the clinic with any problems, questions or concerns. No barriers to learning was detected. I spent 20 minutes counseling the patient face to face. The total time spent in the appointment was 30 minutes and more than 50% was on counseling and review of test results     Alla Feeling, NP 11/17/21   Addendum: B12 level in low-normal range 212 and folate 7.4. Pt prefers B12 injection rather than oral. Will restart B12 inj q6 weeks. I also recommend to start mens daily multivitamin, he agrees. No further needs at this time.   Cira Rue, NP 11/17/21

## 2021-11-17 ENCOUNTER — Other Ambulatory Visit: Payer: Self-pay

## 2021-11-17 ENCOUNTER — Inpatient Hospital Stay (HOSPITAL_BASED_OUTPATIENT_CLINIC_OR_DEPARTMENT_OTHER): Payer: Medicare Other | Admitting: Nurse Practitioner

## 2021-11-17 ENCOUNTER — Encounter: Payer: Self-pay | Admitting: Nurse Practitioner

## 2021-11-17 ENCOUNTER — Inpatient Hospital Stay: Payer: Medicare Other | Attending: Nurse Practitioner

## 2021-11-17 VITALS — BP 160/97 | HR 86 | Temp 98.8°F | Resp 18 | Wt 329.5 lb

## 2021-11-17 DIAGNOSIS — C187 Malignant neoplasm of sigmoid colon: Secondary | ICD-10-CM

## 2021-11-17 DIAGNOSIS — D519 Vitamin B12 deficiency anemia, unspecified: Secondary | ICD-10-CM | POA: Diagnosis not present

## 2021-11-17 DIAGNOSIS — E538 Deficiency of other specified B group vitamins: Secondary | ICD-10-CM | POA: Diagnosis not present

## 2021-11-17 LAB — CEA (IN HOUSE-CHCC): CEA (CHCC-In House): 1.07 ng/mL (ref 0.00–5.00)

## 2021-11-17 LAB — VITAMIN B12: Vitamin B-12: 212 pg/mL (ref 180–914)

## 2021-11-17 LAB — CBC WITH DIFFERENTIAL (CANCER CENTER ONLY)
Abs Immature Granulocytes: 0.03 10*3/uL (ref 0.00–0.07)
Basophils Absolute: 0 10*3/uL (ref 0.0–0.1)
Basophils Relative: 1 %
Eosinophils Absolute: 0.2 10*3/uL (ref 0.0–0.5)
Eosinophils Relative: 3 %
HCT: 44 % (ref 39.0–52.0)
Hemoglobin: 14.7 g/dL (ref 13.0–17.0)
Immature Granulocytes: 1 %
Lymphocytes Relative: 29 %
Lymphs Abs: 1.7 10*3/uL (ref 0.7–4.0)
MCH: 25.5 pg — ABNORMAL LOW (ref 26.0–34.0)
MCHC: 33.4 g/dL (ref 30.0–36.0)
MCV: 76.3 fL — ABNORMAL LOW (ref 80.0–100.0)
Monocytes Absolute: 0.5 10*3/uL (ref 0.1–1.0)
Monocytes Relative: 8 %
Neutro Abs: 3.5 10*3/uL (ref 1.7–7.7)
Neutrophils Relative %: 58 %
Platelet Count: 293 10*3/uL (ref 150–400)
RBC: 5.77 MIL/uL (ref 4.22–5.81)
RDW: 18.7 % — ABNORMAL HIGH (ref 11.5–15.5)
WBC Count: 5.9 10*3/uL (ref 4.0–10.5)
nRBC: 0 % (ref 0.0–0.2)

## 2021-11-17 LAB — CMP (CANCER CENTER ONLY)
ALT: 18 U/L (ref 0–44)
AST: 16 U/L (ref 15–41)
Albumin: 4.1 g/dL (ref 3.5–5.0)
Alkaline Phosphatase: 50 U/L (ref 38–126)
Anion gap: 6 (ref 5–15)
BUN: 15 mg/dL (ref 8–23)
CO2: 29 mmol/L (ref 22–32)
Calcium: 9.3 mg/dL (ref 8.9–10.3)
Chloride: 103 mmol/L (ref 98–111)
Creatinine: 1.14 mg/dL (ref 0.61–1.24)
GFR, Estimated: >60 mL/min (ref >60)
Glucose, Bld: 165 mg/dL — ABNORMAL HIGH (ref 70–99)
Potassium: 3.5 mmol/L (ref 3.5–5.1)
Sodium: 138 mmol/L (ref 135–145)
Total Bilirubin: 0.4 mg/dL (ref 0.3–1.2)
Total Protein: 7.8 g/dL (ref 6.5–8.1)

## 2021-11-17 LAB — FOLATE: Folate: 7.4 ng/mL (ref >5.9)

## 2021-11-17 NOTE — Progress Notes (Signed)
Office notes from today's visit were faxed via Epic to Dr. Koleen Distance, Gastroenterology at [facility] per provider request. No further concerns at this time.

## 2021-11-18 LAB — FOLATE RBC
Folate, Hemolysate: 375 ng/mL
Folate, RBC: 839 ng/mL (ref >498)
Hematocrit: 44.7 % (ref 37.5–51.0)

## 2021-11-19 ENCOUNTER — Telehealth: Payer: Self-pay | Admitting: Nurse Practitioner

## 2021-11-19 NOTE — Telephone Encounter (Signed)
Left message with follow-up appointments per 9/5 los.

## 2021-11-24 ENCOUNTER — Inpatient Hospital Stay: Payer: Medicare Other

## 2021-11-25 ENCOUNTER — Telehealth: Payer: Self-pay

## 2021-11-25 NOTE — Telephone Encounter (Signed)
This nurse reached out to patient and made him aware of lab results and provider recommendations mentioned below.  No further concerns or questions noted.  Patient knows to call clinic if there are any concerns or questions.

## 2021-11-25 NOTE — Telephone Encounter (Signed)
-----   Message from Alla Feeling, NP sent at 11/18/2021  1:35 PM EDT ----- Please let pt know CEA remains normal.  Thanks, Regan Rakers NP

## 2022-01-05 ENCOUNTER — Other Ambulatory Visit: Payer: Self-pay

## 2022-01-05 ENCOUNTER — Inpatient Hospital Stay: Payer: Medicare Other | Attending: Nurse Practitioner

## 2022-01-05 DIAGNOSIS — E538 Deficiency of other specified B group vitamins: Secondary | ICD-10-CM | POA: Diagnosis present

## 2022-01-05 DIAGNOSIS — C187 Malignant neoplasm of sigmoid colon: Secondary | ICD-10-CM | POA: Diagnosis present

## 2022-01-05 DIAGNOSIS — D519 Vitamin B12 deficiency anemia, unspecified: Secondary | ICD-10-CM

## 2022-01-05 MED ORDER — CYANOCOBALAMIN 1000 MCG/ML IJ SOLN
1000.0000 ug | Freq: Once | INTRAMUSCULAR | Status: AC
  Administered 2022-01-05: 1000 ug via INTRAMUSCULAR
  Filled 2022-01-05: qty 1

## 2022-02-16 ENCOUNTER — Inpatient Hospital Stay: Payer: Medicare Other | Attending: Nurse Practitioner

## 2022-02-16 DIAGNOSIS — D519 Vitamin B12 deficiency anemia, unspecified: Secondary | ICD-10-CM

## 2022-02-16 DIAGNOSIS — E538 Deficiency of other specified B group vitamins: Secondary | ICD-10-CM | POA: Diagnosis present

## 2022-02-16 DIAGNOSIS — C187 Malignant neoplasm of sigmoid colon: Secondary | ICD-10-CM | POA: Insufficient documentation

## 2022-02-16 MED ORDER — CYANOCOBALAMIN 1000 MCG/ML IJ SOLN
1000.0000 ug | Freq: Once | INTRAMUSCULAR | Status: AC
  Administered 2022-02-16: 1000 ug via INTRAMUSCULAR
  Filled 2022-02-16: qty 1

## 2022-02-16 NOTE — Patient Instructions (Signed)
Vitamin B12 and Folate Test Why am I having this test? Vitamin B12 and folate (folic acid) are B vitamins needed to make red blood cells and keep your nervous system healthy. Vitamin B12 is in foods such as meats, eggs, dairy products, and fish. Folate is in fruits, beans, and leafy green vegetables. Some foods, such as whole grains, bread, and cereals have vitamin B12 added to them (are fortified). You may not have enough of these B vitamins (have a deficiency) if your diet lacks these vitamins. Low levels can also be caused by diseases or having had surgeries on your stomach or small intestine that interfere with your ability to absorb the vitamins from your food. You may have a vitamin B12 and folate test if: You have symptoms of vitamin B12 or folate deficiency, such as tiredness (fatigue), headache, confusion, poor balance, or tingling and numbness in your hands and feet. You are pregnant or breastfeeding. Women who are pregnant or breastfeeding need more folate and may need to take dietary supplements. Your red blood cell count is low (anemia). You are an older person and have mental confusion. You have a disease or condition that may lead to a deficiency of these B vitamins. What is being tested? This test measures the amount of vitamin B12 and folate in your blood. The tests for vitamin B12 and folate may be done together or separately. What kind of sample is taken?  A blood sample is required for this test. It is usually collected by inserting a needle into a blood vessel. How do I prepare for this test? Follow instructions from your health care provider about eating and drinking before the test. Tell a health care provider about: All medicines you are taking, including vitamins, herbs, eye drops, creams, and over-the-counter medicines. Any medical conditions you have. Whether you are pregnant or may be pregnant. How often you drink alcohol. How are the results reported? Your test  results will be reported as values that identify the amount of vitamin B12 and folate in your blood. Your health care provider will compare your results to normal ranges that were established after testing a large group of people (reference ranges). Reference ranges may vary among labs and hospitals. For this test, common reference ranges are: Vitamin B12: 160-950 pg/mL or 118-701 pmol/L (SI units). Folate: 5-25 ng/mL or 11-57 nmol/L (SI units). What do the results mean? Results within the reference range are considered normal. Vitamin B12 or folate levels that are lower than the reference range may be caused by: Poor nutrition or eating a vegetarian or vegan diet that does not include any foods that come from animals. Having alcoholism. Having certain diseases that make it hard to absorb vitamin B12. These diseases include Crohn's disease, chronic pancreatitis, and cystic fibrosis. Taking certain medicines. Having had surgeries on your stomach or small intestine. High levels of vitamin B12 are rare, but they may happen if you have: Cancer. Liver disease. High levels of folate may happen if: You have anemia. You are vegetarian. You have had a recent blood transfusion. Talk with your health care provider about what your results mean. Questions to ask your health care provider Ask your health care provider, or the department that is doing the test: When will my results be ready? How will I get my results? What are my treatment options? What other tests do I need? What are my next steps? Summary Vitamin B12 and folate (folic acid) are both B vitamins that are needed to   make red blood cells and to keep your nervous system healthy. You may not have enough B vitamins in your body if you do not get enough in your diet or if you have a disease that makes it hard to absorb vitamin B12. This test measures the amount of vitamin B12 and folate in your blood. A blood sample is required for the  test. Talk with your health care provider about what your results mean. This information is not intended to replace advice given to you by your health care provider. Make sure you discuss any questions you have with your health care provider. Document Revised: 10/24/2020 Document Reviewed: 10/24/2020 Elsevier Patient Education  2023 [employer] 

## 2022-02-17 ENCOUNTER — Other Ambulatory Visit: Payer: Self-pay

## 2022-02-17 DIAGNOSIS — D519 Vitamin B12 deficiency anemia, unspecified: Secondary | ICD-10-CM

## 2022-02-17 DIAGNOSIS — C187 Malignant neoplasm of sigmoid colon: Secondary | ICD-10-CM

## 2022-02-24 ENCOUNTER — Inpatient Hospital Stay: Payer: Medicare Other

## 2022-02-24 ENCOUNTER — Other Ambulatory Visit: Payer: Self-pay

## 2022-02-24 DIAGNOSIS — D519 Vitamin B12 deficiency anemia, unspecified: Secondary | ICD-10-CM

## 2022-02-24 DIAGNOSIS — C187 Malignant neoplasm of sigmoid colon: Secondary | ICD-10-CM

## 2022-03-12 ENCOUNTER — Encounter: Payer: Self-pay | Admitting: Hematology

## 2022-03-16 ENCOUNTER — Telehealth: Payer: Self-pay

## 2022-03-16 LAB — GUARDANT 360

## 2022-03-16 NOTE — Telephone Encounter (Signed)
  Called the pt and verbalized with him, his Guardant Reveal Test Results which is "NOT DETECTED" Patient understood the test results and had no further questions or concerns.

## 2022-03-23 ENCOUNTER — Encounter: Payer: Self-pay | Admitting: Nurse Practitioner

## 2022-03-29 NOTE — Progress Notes (Unsigned)
Patient Care Team: Kristie Cowman, MD as PCP - General (Family Medicine) Alla Feeling, NP as Nurse Practitioner (Oncology) Truitt Merle, MD as Consulting Physician (Hematology and Oncology) Leighton Ruff, MD as Consulting Physician (General Surgery) Koleen Distance, MD as Referring Physician (Gastroenterology)   CHIEF COMPLAINT: Follow-up colon cancer  Oncology History Overview Note   Cancer Staging  Cancer of sigmoid colon [facility]) Staging form: Colon and Rectum, AJCC 8th Edition - Pathologic stage from 06/20/2020: Stage IIA (pT3, pN0, cM0) - Signed by Alla Feeling, NP on 07/21/2020    Cancer of sigmoid colon ([place])  05/15/2020 Procedure   Colonoscopy by Dr. [name]: obstructing mass found in the sigmoid colon located 35 cm from the anus.  Unable to advance past the mass.  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  No metastatic disease in the chest, abdomen, pelvis   06/12/2020 Tumor Marker   Baseline CEA 6.1   06/20/2020 Initial Diagnosis   Cancer of sigmoid colon ([place])   06/20/2020 Cancer Staging   Staging form: Colon and Rectum, AJCC 8th Edition - Pathologic stage from 06/20/2020: Stage IIA (pT3, pN0, cM0) - Signed by Alla Feeling, NP on 07/21/2020 Stage prefix: Initial diagnosis Total positive nodes: 0 Histologic grading system: 4 grade system Histologic grade (G): G2   06/20/2020 Definitive Surgery   Laparoscopic partial colectomy, small bowel resection by Dr. Marcello Moores   06/20/2020 Pathology Results   FINAL MICROSCOPIC DIAGNOSIS:   A. COLON, SIGMOID AND ATTACHED SMALL  BOWEL, RESECTION:  -  Adenocarcinoma, moderately differentiated, 12.7 cm  -  No carcinoma identified in twenty-four lymph nodes (0/24)  -  Margins uninvolved by carcinoma (0.2 cm; mesenteric margin)  -  Sessile serrated polyp  -  See oncology table and comment below   B. COLON, ADDITIONAL PROXIMAL MARGIN, RESECTION:  -  No residual adenocarcinoma identified  -  No carcinoma identified in three lymph nodes (0/3)   MMR normal, MSI-stable   05/22/2021 Imaging   EXAM: CT CHEST, ABDOMEN, AND PELVIS WITH CONTRAST  IMPRESSION: 1. Prior partial sigmoidectomy with anastomotic sutures in the superior pelvis, without evidence of local recurrence/residual disease. 2. No convincing evidence of metastatic disease in the chest, abdomen or pelvis. 3. Prominent left common iliac lymph node measures 8 mm, is nonspecific. Attention on follow-up imaging suggested. 4. Generalized decrease in hepatic parenchymal attenuation with focal geographic areas of relative hyperattenuation, almost certainly reflects diffuse hepatic steatosis with focal fatty sparing. This could be more definitively characterized with hepatic protocol abdominal MRI with and without contrast if clinically indicated. 5.  Aortic Atherosclerosis (ICD10-I70.0).      CURRENT THERAPY: Surveillance  INTERVAL HISTORY Mr Eric Macdonald returns for follow-up as scheduled, last seen by me 11/17/2021.  Last guardant reveal in December was not detected. He is doing well, enjoys playing guitar and spending time with 10 grandchildren. Eating and drinking well, good energy. Denies change in bowel habits, rectal bleeding, or abdominal pain/bloating.   ROS  All other systems reviewed and negative  Past Medical History:  Diagnosis Date   Arthritis    Cancer ([place])    colon  Chronic low back pain    Is supposed to have surgery soon at [facility]   Hypertension    Knee pain      Past Surgical History:  Procedure Laterality Date   JOINT REPLACEMENT      knee 05/2011   KNEE SURGERY  08   rt arthroscopy   LAPAROSCOPIC PARTIAL COLECTOMY N/A 06/20/2020   Procedure: LAPAROSCOPIC PARTIAL COLECTOMY;  Surgeon: Leighton Ruff, MD;  Location: [facility];  Service: General;  Laterality: N/A;   TOTAL KNEE ARTHROPLASTY  05/05/2011   Procedure: TOTAL KNEE ARTHROPLASTY;  Surgeon: Ninetta Lights, MD;  Location: [facility];  Service: Orthopedics;  Laterality: Right;  DR MURPHY WANTS 90 MINUTES FOR THIS CASE     Outpatient Encounter Medications as of 03/30/2022  Medication Sig   amLODipine (NORVASC) 10 MG tablet Take 10 mg by mouth daily.   hydrochlorothiazide (MICROZIDE) 12.5 MG capsule Take 12.5 mg by mouth daily.   losartan (COZAAR) 100 MG tablet Take 100 mg by mouth daily.   acetaminophen (TYLENOL) 500 MG tablet Take 2 tablets (1,000 mg total) by mouth every 6 (six) hours. (Patient not taking: Reported on 03/30/2022)   traMADol (ULTRAM) 50 MG tablet Take 1-2 tablets (50-100 mg total) by mouth every 6 (six) hours as needed for moderate pain or severe pain. (Patient not taking: Reported on 03/30/2022)   [DISCONTINUED] cloNIDine (CATAPRES) 0.1 MG tablet Take 0.1 mg by mouth 2 (two) times daily.    No facility-administered encounter medications on file as of 03/30/2022.     Today's Vitals   03/30/22 1017 03/30/22 1024  BP: 122/78   Pulse: (!) 106   Resp: 19   Temp: 98.4 F (36.9 C)   TempSrc: Oral   SpO2: 96%   Weight: (!) 331 lb 12.8 oz (150.5 kg)   Height: 5' 11.5" (1.816 m)   PainSc:  0-No pain   Body mass index is 45.63 kg/m.   PHYSICAL EXAM GENERAL:alert, no distress and comfortable SKIN: no rash  EYES: sclera clear LUNGS: clear with normal breathing effort HEART: regular rate & rhythm, no lower extremity edema ABDOMEN: abdomen soft, non-tender and normal bowel sounds NEURO: alert & oriented x 3 with fluent speech, no focal motor/sensory deficits   CBC    Component Value Date/Time   WBC 6.8 03/30/2022 0955   WBC 8.5 06/12/2020 1427   RBC  5.85 (H) 03/30/2022 0955   HGB 15.1 03/30/2022 0955   HCT 45.4 03/30/2022 0955   HCT 44.7 11/17/2021 1042   PLT 320 03/30/2022 0955   MCV 77.6 (L) 03/30/2022 0955   MCH 25.8 (L) 03/30/2022 0955   MCHC 33.3 03/30/2022 0955   RDW 18.2 (H) 03/30/2022 0955   LYMPHSABS 2.5 03/30/2022 0955   MONOABS 0.6 03/30/2022 0955   EOSABS 0.2 03/30/2022 0955   BASOSABS 0.1 03/30/2022 0955     CMP     Component Value Date/Time   NA 140 03/30/2022 0955   K 3.4 (L) 03/30/2022 0955   CL 102 03/30/2022 0955   CO2 32 03/30/2022 0955   GLUCOSE 160 (H) 03/30/2022 0955   BUN 12 03/30/2022 0955   CREATININE 1.23 03/30/2022 0955   CALCIUM 9.4 03/30/2022 0955   PROT 7.9 03/30/2022 0955   ALBUMIN 4.0 03/30/2022 0955   AST 19 03/30/2022 0955   ALT 26 03/30/2022 0955   ALKPHOS 52 03/30/2022 0955   BILITOT 0.5 03/30/2022 0955   GFRNONAA >60 03/30/2022 0955   GFRAA >90 05/07/2011 0515  ASSESSMENT & PLAN:Eric Macdonald is a 67 y.o. male with    1.  Adenocarcinoma of the sigmoid colon, moderately differentiated G2, pT3pN0M0 stage IIA, MMR normal, MSI-stable -He presented with 2-6 months bloody diarrhea, fatigue, and 20 lbs weight loss. 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 Baseline CEA mildly elevated 6.1, staging work up negative for metastatic disease.  -S/p partial colectomy 06/20/20 by Dr. Marcello Moores, surgical path showed 12.7 cm carcinoma was removed, 0/27 LNs+, margins clear. There were no high risk features such as perforation, PNI, or LVI. -Given stage II colon cancer, adjuvant chemotherapy is not recommended. -The recurrence risk is moderate.   -ctDNA not detected (07/21/2020, 08/25/2020, 10/27/2020, 01/26/2021 and 05/25/2021, and 02/2022) -Surveillance CT 05/22/2021 showed no definitive evidence of recurrence -I reviewed surveillance colonoscopy from 11/2021 by Dr. Alan Ripper at [place] which was negative, 3-year recall -Mr. Shere is clinically doing well.  Exam is benign, labs are  unremarkable.  CEA is pending.  Overall no clinical concern for recurrence.   -Continue surveillance, next CT due 05/2022 which will likely be his last surveillance scan, then phone visit in a few days after -Follow-up in 6 months, or sooner if needed   2.  Family history, genetics -He tells me that a first cousin and his half sister have pancreatic cancer, which was not known at initial consult -I reviewed the guidelines for genetic testing in those with family history of pancreatic cancer.  Given that he has 2 relatives with pancreatic cancer, he qualifies -Declined genetic testing today   3. Weight Gain, Fatty Liver -he gained ~50 lbs following his surgery. -he was noted to have diffuse hepatic steatosis with focal fatty sparing on CT CAP on 05/22/21. -He continues to gain weight, he attributes lack of exercise to the left knee arthritis.  He plans to start aqua aerobics at the [facility] aquatic center where he can swim for $2 per visit   4.  Microcytic anemia, secondary to tumor bleeding; Z61 and folic acid deficiency  -Hgb 9.5 on 3/31 pre-op; MCV 69 with thrombocytosis. Likely secondary to tumor bleeding. Work up showed serum iron 34, TIBC 144, ferritin 125; he was found to have low folate 3.5 and low B12 103 -He is Jehovah's witness, does not accept blood products.  -B12 and folate WNL since 10/2020 -He began B12 injections in 2022, B12 level normalized.  He prefers to continue B12 injection rather than take oral pill  -He has mild persistent microcytosis without anemia   PLAN: -Recent colonoscopy and today's labs reviewed, CEA pending -B12 injection today, continue monthly  -Surveillance CT in 05/2022, phone f/up few days after -Lab, f/up in 6 months, or sooner if needed   Orders Placed This Encounter  Procedures   CT CHEST ABDOMEN PELVIS W CONTRAST    Standing Status:   Future    Standing Expiration Date:   03/31/2023    Order Specific Question:   If indicated for the ordered  procedure, I authorize the administration of contrast media per Radiology protocol    Answer:   Yes    Order Specific Question:   Does the patient have a contrast media/X-ray dye allergy?    Answer:   No    Order Specific Question:   Preferred imaging location?    Answer:   [facility]    Order Specific Question:   Is Oral Contrast requested for this exam?    Answer:   Yes, Per Radiology protocol  All questions were answered. The patient knows to call the clinic with any problems, questions or concerns. No barriers to learning were detected. I spent 20 minutes counseling the patient face to face. The total time spent in the appointment was 30 minutes and more than 50% was on counseling, review of test results, and coordination of care.   Cira Rue, NP-C 03/30/2022

## 2022-03-30 ENCOUNTER — Encounter: Payer: Self-pay | Admitting: Nurse Practitioner

## 2022-03-30 ENCOUNTER — Inpatient Hospital Stay: Payer: 59

## 2022-03-30 ENCOUNTER — Inpatient Hospital Stay: Payer: 59 | Attending: Nurse Practitioner | Admitting: Nurse Practitioner

## 2022-03-30 ENCOUNTER — Other Ambulatory Visit: Payer: Self-pay

## 2022-03-30 VITALS — BP 122/78 | HR 106 | Temp 98.4°F | Resp 19 | Ht 71.5 in | Wt 331.8 lb

## 2022-03-30 DIAGNOSIS — D519 Vitamin B12 deficiency anemia, unspecified: Secondary | ICD-10-CM

## 2022-03-30 DIAGNOSIS — C187 Malignant neoplasm of sigmoid colon: Secondary | ICD-10-CM | POA: Insufficient documentation

## 2022-03-30 DIAGNOSIS — E538 Deficiency of other specified B group vitamins: Secondary | ICD-10-CM | POA: Insufficient documentation

## 2022-03-30 LAB — CMP (CANCER CENTER ONLY)
ALT: 26 U/L (ref 0–44)
AST: 19 U/L (ref 15–41)
Albumin: 4 g/dL (ref 3.5–5.0)
Alkaline Phosphatase: 52 U/L (ref 38–126)
Anion gap: 6 (ref 5–15)
BUN: 12 mg/dL (ref 8–23)
CO2: 32 mmol/L (ref 22–32)
Calcium: 9.4 mg/dL (ref 8.9–10.3)
Chloride: 102 mmol/L (ref 98–111)
Creatinine: 1.23 mg/dL (ref 0.61–1.24)
GFR, Estimated: >60 mL/min (ref >60)
Glucose, Bld: 160 mg/dL — ABNORMAL HIGH (ref 70–99)
Potassium: 3.4 mmol/L — ABNORMAL LOW (ref 3.5–5.1)
Sodium: 140 mmol/L (ref 135–145)
Total Bilirubin: 0.5 mg/dL (ref 0.3–1.2)
Total Protein: 7.9 g/dL (ref 6.5–8.1)

## 2022-03-30 LAB — CBC WITH DIFFERENTIAL (CANCER CENTER ONLY)
Abs Immature Granulocytes: 0.01 10*3/uL (ref 0.00–0.07)
Basophils Absolute: 0.1 10*3/uL (ref 0.0–0.1)
Basophils Relative: 1 %
Eosinophils Absolute: 0.2 10*3/uL (ref 0.0–0.5)
Eosinophils Relative: 2 %
HCT: 45.4 % (ref 39.0–52.0)
Hemoglobin: 15.1 g/dL (ref 13.0–17.0)
Immature Granulocytes: 0 %
Lymphocytes Relative: 36 %
Lymphs Abs: 2.5 10*3/uL (ref 0.7–4.0)
MCH: 25.8 pg — ABNORMAL LOW (ref 26.0–34.0)
MCHC: 33.3 g/dL (ref 30.0–36.0)
MCV: 77.6 fL — ABNORMAL LOW (ref 80.0–100.0)
Monocytes Absolute: 0.6 10*3/uL (ref 0.1–1.0)
Monocytes Relative: 9 %
Neutro Abs: 3.5 10*3/uL (ref 1.7–7.7)
Neutrophils Relative %: 52 %
Platelet Count: 320 10*3/uL (ref 150–400)
RBC: 5.85 MIL/uL — ABNORMAL HIGH (ref 4.22–5.81)
RDW: 18.2 % — ABNORMAL HIGH (ref 11.5–15.5)
WBC Count: 6.8 10*3/uL (ref 4.0–10.5)
nRBC: 0 % (ref 0.0–0.2)

## 2022-03-30 LAB — CEA (IN HOUSE-CHCC): CEA (CHCC-In House): 1.06 ng/mL (ref 0.00–5.00)

## 2022-03-30 MED ORDER — CYANOCOBALAMIN 1000 MCG/ML IJ SOLN
1000.0000 ug | Freq: Once | INTRAMUSCULAR | Status: AC
  Administered 2022-03-30: 1000 ug via INTRAMUSCULAR
  Filled 2022-03-30: qty 1

## 2022-03-31 ENCOUNTER — Telehealth: Payer: Self-pay | Admitting: Nurse Practitioner

## 2022-03-31 ENCOUNTER — Telehealth: Payer: Self-pay

## 2022-03-31 NOTE — Telephone Encounter (Signed)
Patient aware of all upcoming appointments 

## 2022-03-31 NOTE — Telephone Encounter (Addendum)
Called and relayed message below. Patient voiced understanding.    ----- Message from Alla Feeling, NP sent at 03/30/2022 11:52 PM EST ----- Please let pt know CEA is normal, no concerns.  Thanks, Regan Rakers NP

## 2022-05-03 ENCOUNTER — Other Ambulatory Visit: Payer: Self-pay

## 2022-05-03 ENCOUNTER — Inpatient Hospital Stay: Payer: 59 | Attending: Nurse Practitioner

## 2022-05-03 DIAGNOSIS — E538 Deficiency of other specified B group vitamins: Secondary | ICD-10-CM | POA: Insufficient documentation

## 2022-05-03 DIAGNOSIS — D519 Vitamin B12 deficiency anemia, unspecified: Secondary | ICD-10-CM

## 2022-05-03 MED ORDER — CYANOCOBALAMIN 1000 MCG/ML IJ SOLN
1000.0000 ug | Freq: Once | INTRAMUSCULAR | Status: AC
  Administered 2022-05-03: 1000 ug via INTRAMUSCULAR
  Filled 2022-05-03: qty 1

## 2022-05-03 NOTE — Patient Instructions (Signed)
Vitamin B12 and Folate Test Why am I having this test? Vitamin 123456 and folate (folic acid) are B vitamins needed to make red blood cells and keep your nervous system healthy. Vitamin B12 is in foods such as meats, eggs, dairy products, and fish. Folate is in fruits, beans, and leafy green vegetables. Some foods, such as whole grains, bread, and cereals have vitamin B12 added to them (are fortified). You may not have enough of these B vitamins (have a deficiency) if your diet lacks these vitamins. Low levels can also be caused by diseases or having had surgeries on your stomach or small intestine that interfere with your ability to absorb the vitamins from your food. You may have a vitamin B12 and folate test if: You have symptoms of vitamin B12 or folate deficiency, such as tiredness (fatigue), headache, confusion, poor balance, or tingling and numbness in your hands and feet. You are pregnant or breastfeeding. Women who are pregnant or breastfeeding need more folate and may need to take dietary supplements. Your red blood cell count is low (anemia). You are an older person and have mental confusion. You have a disease or condition that may lead to a deficiency of these B vitamins. What is being tested? This test measures the amount of vitamin B12 and folate in your blood. The tests for vitamin B12 and folate may be done together or separately. What kind of sample is taken?  A blood sample is required for this test. It is usually collected by inserting a needle into a blood vessel. How do I prepare for this test? Follow instructions from your health care provider about eating and drinking before the test. Tell a health care provider about: All medicines you are taking, including vitamins, herbs, eye drops, creams, and over-the-counter medicines. Any medical conditions you have. Whether you are pregnant or may be pregnant. How often you drink alcohol. How are the results reported? Your test  results will be reported as values that identify the amount of vitamin B12 and folate in your blood. Your health care provider will compare your results to normal ranges that were established after testing a large group of people (reference ranges). Reference ranges may vary among labs and hospitals. For this test, common reference ranges are: Vitamin B12: 160-950 pg/mL or 118-701 pmol/L (SI units). Folate: 5-25 ng/mL or 11-57 nmol/L (SI units). What do the results mean? Results within the reference range are considered normal. Vitamin B12 or folate levels that are lower than the reference range may be caused by: Poor nutrition or eating a vegetarian or vegan diet that does not include any foods that come from animals. Having alcoholism. Having certain diseases that make it hard to absorb vitamin B12. These diseases include Crohn's disease, chronic pancreatitis, and cystic fibrosis. Taking certain medicines. Having had surgeries on your stomach or small intestine. High levels of vitamin B12 are rare, but they may happen if you have: Cancer. Liver disease. High levels of folate may happen if: You have anemia. You are vegetarian. You have had a recent blood transfusion. Talk with your health care provider about what your results mean. Questions to ask your health care provider Ask your health care provider, or the department that is doing the test: When will my results be ready? How will I get my results? What are my treatment options? What other tests do I need? What are my next steps? Summary Vitamin 123456 and folate (folic acid) are both B vitamins that are needed to  make red blood cells and to keep your nervous system healthy. You may not have enough B vitamins in your body if you do not get enough in your diet or if you have a disease that makes it hard to absorb vitamin B12. This test measures the amount of vitamin B12 and folate in your blood. A blood sample is required for the  test. Talk with your health care provider about what your results mean. This information is not intended to replace advice given to you by your health care provider. Make sure you discuss any questions you have with your health care provider. Document Revised: 10/24/2020 Document Reviewed: 10/24/2020 Elsevier Patient Education  [place].

## 2022-05-04 ENCOUNTER — Telehealth: Payer: Self-pay

## 2022-05-04 ENCOUNTER — Other Ambulatory Visit: Payer: Self-pay

## 2022-05-04 NOTE — Telephone Encounter (Signed)
Pt called requesting if his sister could be removed from chart.  Pt stated if we could call his daughter Heloise Purpura if we are unable to reach him.  Updated pt's chart per pt's request.  Pt had no further questions or concerns at this time.

## 2022-05-31 ENCOUNTER — Inpatient Hospital Stay: Payer: 59 | Attending: Nurse Practitioner

## 2022-05-31 ENCOUNTER — Other Ambulatory Visit: Payer: Self-pay

## 2022-05-31 VITALS — BP 145/77 | HR 89 | Temp 98.1°F | Resp 18

## 2022-05-31 DIAGNOSIS — D519 Vitamin B12 deficiency anemia, unspecified: Secondary | ICD-10-CM

## 2022-05-31 DIAGNOSIS — E538 Deficiency of other specified B group vitamins: Secondary | ICD-10-CM | POA: Diagnosis not present

## 2022-05-31 MED ORDER — CYANOCOBALAMIN 1000 MCG/ML IJ SOLN: 1000.0000 ug | Freq: Once | INTRAMUSCULAR | Status: DC

## 2022-05-31 MED ORDER — CYANOCOBALAMIN 1000 MCG/ML IJ SOLN
1000.0000 ug | Freq: Once | INTRAMUSCULAR | Status: AC
  Administered 2022-05-31: 1000 ug via INTRAMUSCULAR
  Filled 2022-05-31: qty 1

## 2022-06-02 ENCOUNTER — Inpatient Hospital Stay: Payer: 59 | Admitting: Hematology

## 2022-06-02 NOTE — Assessment & Plan Note (Deleted)
G2, pT3pN0M0 stage IIA, MMR normal, MSI-stable -He presented with 2-6 months bloody diarrhea, fatigue, and 20 lbs weight loss. 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 Baseline CEA mildly elevated 6.1, staging work up negative for metastatic disease.  -S/p partial colectomy 06/20/20 by Dr. Marcello Moores, surgical path showed 12.7 cm carcinoma was removed, 0/27 LNs+, margins clear. There were no high risk features such as perforation, PNI, or LVI. -Given stage II colon cancer, adjuvant chemotherapy is not recommended. -Due to his moderate recurrence risk, he has been on surveillance, ctDNA not detected on 07/21/20, 08/25/20, 10/27/20, and 01/26/21 -most recent CT CAP from 05/22/21 showed no convincing evidence of metastatic disease

## 2022-06-17 ENCOUNTER — Ambulatory Visit: Payer: 59 | Admitting: Family Medicine

## 2022-06-18 ENCOUNTER — Ambulatory Visit (HOSPITAL_COMMUNITY): Payer: 59

## 2022-06-28 ENCOUNTER — Telehealth: Payer: Self-pay | Admitting: Hematology

## 2022-06-30 ENCOUNTER — Other Ambulatory Visit: Payer: Self-pay

## 2022-06-30 ENCOUNTER — Inpatient Hospital Stay: Payer: 59 | Attending: Nurse Practitioner

## 2022-06-30 VITALS — BP 122/88 | HR 100 | Temp 98.8°F | Resp 16

## 2022-06-30 DIAGNOSIS — Z85038 Personal history of other malignant neoplasm of large intestine: Secondary | ICD-10-CM | POA: Insufficient documentation

## 2022-06-30 DIAGNOSIS — E538 Deficiency of other specified B group vitamins: Secondary | ICD-10-CM | POA: Insufficient documentation

## 2022-06-30 DIAGNOSIS — D519 Vitamin B12 deficiency anemia, unspecified: Secondary | ICD-10-CM

## 2022-06-30 MED ORDER — CYANOCOBALAMIN 1000 MCG/ML IJ SOLN
1000.0000 ug | Freq: Once | INTRAMUSCULAR | Status: AC
  Administered 2022-06-30: 1000 ug via INTRAMUSCULAR
  Filled 2022-06-30: qty 1

## 2022-07-07 ENCOUNTER — Encounter: Payer: Self-pay | Admitting: Nurse Practitioner

## 2022-07-08 ENCOUNTER — Ambulatory Visit (HOSPITAL_COMMUNITY)
Admission: RE | Admit: 2022-07-08 | Discharge: 2022-07-08 | Disposition: A | Discharge status: Home / Self Care | Payer: 59 | Source: Ambulatory Visit | Attending: Nurse Practitioner | Admitting: Nurse Practitioner

## 2022-07-08 DIAGNOSIS — C187 Malignant neoplasm of sigmoid colon: Secondary | ICD-10-CM | POA: Insufficient documentation

## 2022-07-08 MED ORDER — IOHEXOL 9 MG/ML PO SOLN
ORAL | Status: AC
  Filled 2022-07-08: qty 1000

## 2022-07-08 MED ORDER — IOHEXOL 9 MG/ML PO SOLN
500.0000 mL | ORAL | Status: AC
  Administered 2022-07-08 (×2): 500 mL via ORAL

## 2022-07-08 MED ORDER — IOHEXOL 300 MG/ML  SOLN
100.0000 mL | Freq: Once | INTRAMUSCULAR | Status: AC | PRN
  Administered 2022-07-08: 100 mL via INTRAVENOUS

## 2022-07-13 ENCOUNTER — Telehealth: Payer: Self-pay

## 2022-07-13 NOTE — Telephone Encounter (Addendum)
Called patient and relayed message below as per Santiago Glad NP, patient voiced full understanding.   ----- Message from Pollyann Samples, NP sent at 07/13/2022 10:07 AM EDT ----- Please call patient to let him know surveillance CT looks good, stable, no evidence of recurrent colon cancer. No change to his current schedule.   Thanks, Clayborn Heron NP

## 2022-08-02 ENCOUNTER — Inpatient Hospital Stay: Payer: 59 | Attending: Nurse Practitioner

## 2022-08-02 DIAGNOSIS — E538 Deficiency of other specified B group vitamins: Secondary | ICD-10-CM | POA: Insufficient documentation

## 2022-08-02 DIAGNOSIS — D519 Vitamin B12 deficiency anemia, unspecified: Secondary | ICD-10-CM

## 2022-08-02 MED ORDER — CYANOCOBALAMIN 1000 MCG/ML IJ SOLN
1000.0000 ug | Freq: Once | INTRAMUSCULAR | Status: AC
  Administered 2022-08-02: 1000 ug via INTRAMUSCULAR
  Filled 2022-08-02: qty 1

## 2022-08-13 ENCOUNTER — Other Ambulatory Visit: Payer: Self-pay

## 2022-08-13 DIAGNOSIS — D519 Vitamin B12 deficiency anemia, unspecified: Secondary | ICD-10-CM

## 2022-08-13 DIAGNOSIS — C187 Malignant neoplasm of sigmoid colon: Secondary | ICD-10-CM

## 2022-08-30 ENCOUNTER — Other Ambulatory Visit: Payer: Self-pay

## 2022-08-30 ENCOUNTER — Inpatient Hospital Stay: Payer: 59 | Attending: Nurse Practitioner

## 2022-08-30 ENCOUNTER — Other Ambulatory Visit: Payer: Self-pay | Admitting: Nurse Practitioner

## 2022-08-30 ENCOUNTER — Inpatient Hospital Stay: Payer: 59

## 2022-08-30 DIAGNOSIS — D519 Vitamin B12 deficiency anemia, unspecified: Secondary | ICD-10-CM

## 2022-08-30 DIAGNOSIS — D649 Anemia, unspecified: Secondary | ICD-10-CM | POA: Diagnosis not present

## 2022-08-30 DIAGNOSIS — R970 Elevated carcinoembryonic antigen [CEA]: Secondary | ICD-10-CM | POA: Diagnosis not present

## 2022-08-30 DIAGNOSIS — E538 Deficiency of other specified B group vitamins: Secondary | ICD-10-CM | POA: Insufficient documentation

## 2022-08-30 DIAGNOSIS — C187 Malignant neoplasm of sigmoid colon: Secondary | ICD-10-CM

## 2022-08-30 LAB — CMP (CANCER CENTER ONLY)
ALT: 22 U/L (ref 0–44)
AST: 17 U/L (ref 15–41)
Albumin: 3.7 g/dL (ref 3.5–5.0)
Alkaline Phosphatase: 51 U/L (ref 38–126)
Anion gap: 8 (ref 5–15)
BUN: 11 mg/dL (ref 8–23)
CO2: 31 mmol/L (ref 22–32)
Calcium: 9.6 mg/dL (ref 8.9–10.3)
Chloride: 99 mmol/L (ref 98–111)
Creatinine: 1.35 mg/dL — ABNORMAL HIGH (ref 0.61–1.24)
GFR, Estimated: 58 mL/min — ABNORMAL LOW (ref >60)
Glucose, Bld: 129 mg/dL — ABNORMAL HIGH (ref 70–99)
Potassium: 3.2 mmol/L — ABNORMAL LOW (ref 3.5–5.1)
Sodium: 138 mmol/L (ref 135–145)
Total Bilirubin: 0.5 mg/dL (ref 0.3–1.2)
Total Protein: 7.3 g/dL (ref 6.5–8.1)

## 2022-08-30 LAB — CBC WITH DIFFERENTIAL (CANCER CENTER ONLY)
Abs Immature Granulocytes: 0.01 10*3/uL (ref 0.00–0.07)
Basophils Absolute: 0.1 10*3/uL (ref 0.0–0.1)
Basophils Relative: 1 %
Eosinophils Absolute: 0.2 10*3/uL (ref 0.0–0.5)
Eosinophils Relative: 3 %
HCT: 45.5 % (ref 39.0–52.0)
Hemoglobin: 14.8 g/dL (ref 13.0–17.0)
Immature Granulocytes: 0 %
Lymphocytes Relative: 35 %
Lymphs Abs: 2.2 10*3/uL (ref 0.7–4.0)
MCH: 25 pg — ABNORMAL LOW (ref 26.0–34.0)
MCHC: 32.5 g/dL (ref 30.0–36.0)
MCV: 76.9 fL — ABNORMAL LOW (ref 80.0–100.0)
Monocytes Absolute: 0.8 10*3/uL (ref 0.1–1.0)
Monocytes Relative: 12 %
Neutro Abs: 3.3 10*3/uL (ref 1.7–7.7)
Neutrophils Relative %: 49 %
Platelet Count: 337 10*3/uL (ref 150–400)
RBC: 5.92 MIL/uL — ABNORMAL HIGH (ref 4.22–5.81)
RDW: 18.4 % — ABNORMAL HIGH (ref 11.5–15.5)
WBC Count: 6.5 10*3/uL (ref 4.0–10.5)
nRBC: 0 % (ref 0.0–0.2)

## 2022-08-30 LAB — CEA (IN HOUSE-CHCC): CEA (CHCC-In House): <1 ng/mL (ref 0.00–5.00)

## 2022-08-30 MED ORDER — POTASSIUM CHLORIDE CRYS ER 20 MEQ PO TBCR: 20.0000 meq | EXTENDED_RELEASE_TABLET | Freq: Two times a day (BID) | ORAL | 0 refills | Status: DC

## 2022-08-30 MED ORDER — CYANOCOBALAMIN 1000 MCG/ML IJ SOLN
1000.0000 ug | Freq: Once | INTRAMUSCULAR | Status: AC
  Administered 2022-08-30: 1000 ug via INTRAMUSCULAR
  Filled 2022-08-30: qty 1

## 2022-08-31 ENCOUNTER — Telehealth: Payer: Self-pay

## 2022-08-31 ENCOUNTER — Encounter: Payer: Self-pay | Admitting: Nurse Practitioner

## 2022-08-31 NOTE — Telephone Encounter (Addendum)
Called patient and relayed the message below as per Santiago Glad NP. Patient voiced full understanding.   ----- Message from Pollyann Samples, NP sent at 08/30/2022 10:21 PM EDT ----- Please call pt, his K has decreased and Scr is elevated. May be related to dehydration and/or BP med. I have prescribed oral K 20 mEq BID. He is establishing with new PCP this week, please have him discuss this.   Thanks, Clayborn Heron, NP

## 2022-08-31 NOTE — Progress Notes (Signed)
HPI: EricEric Macdonald is a 67 y.o. male, who is here today to establish care.  Former PCP: Dr Yetta Barre at [facility].  He has a history of hypertension and is currently taking hydrochlorothiazide 25 mg daily and amlodipine 10 mg daily. He has been prescribed potassium supplementation due to hypokalemia, has not picked up prescription, started OTC potassium 2 days ago.  Reports hx of colon cancer and iron deficiency anemia, he has been receiving iron infusions at his hematologist's office. 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 05/15/20 obstructing mass found in the sigmoid, unable to complete colonoscopy. S/P laparoscopic partial colectomy,small bowel resection on 06/20/20.  Regarding his current concerns, he reports experiencing urinary frequency, although he states that his blood pressure has been well-controlled during office visits. He does not check his blood pressure at home and denies experiencing any unusual headaches, chest pain, or difficulty breathing.  Lab Results  Component Value Date   CREATININE 1.35 (H) 08/30/2022   BUN 11 08/30/2022   NA 138 08/30/2022   K 3.2 (L) 08/30/2022   CL 99 08/30/2022   CO2 31 08/30/2022   He acknowledges the need for more exercise and mentions having undergone a right knee replacement eight years ago. Currently, his left knee is causing him discomfort, limiting his ability to walk as he normally would. Per records, he has a history of depression, but he denies feeling depressed at this time. He has been sad due to the recent loss of his sister to pancreatic cancer.  His glucose was 160 on June 17, no known hx of diabetes. He admits to having a weakness for cakes and pies, which he consumes every other day.  He has not had an eye exam in over ten years and reports trouble hearing.   Lab Results  Component Value Date   WBC 6.5 08/30/2022   HGB 14.8 08/30/2022   HCT 45.5 08/30/2022    MCV 76.9 (L) 08/30/2022   PLT 337 08/30/2022   Aortic atherosclerosis seen on chest CT 05/2021 as well as hepatic steatosis. Lab Results  Component Value Date   ALT 22 08/30/2022   AST 17 08/30/2022   ALKPHOS 51 08/30/2022   BILITOT 0.5 08/30/2022   Review of Systems  Constitutional:  Negative for chills and fever.  HENT:  Negative for mouth sores and sore throat.   Respiratory:  Negative for cough and wheezing.   Gastrointestinal:  Negative for abdominal pain, nausea and vomiting.  Endocrine: Negative for cold intolerance and heat intolerance.  Genitourinary:  Negative for decreased urine volume, dysuria and hematuria.  Musculoskeletal:  Positive for back pain.  Skin:  Negative for rash.  Neurological:  Negative for syncope, facial asymmetry and headaches.  Psychiatric/Behavioral:  Negative for confusion and hallucinations.   See other pertinent positives and negatives in HPI.  Current Outpatient Medications on File Prior to Visit  Medication Sig Dispense Refill   [DISCONTINUED] cloNIDine (CATAPRES) 0.1 MG tablet Take 0.1 mg by mouth 2 (two) times daily.      No current facility-administered medications on file prior to visit.   Past Medical History:  Diagnosis Date   Arthritis    Cancer (HCC)    colon   Chronic low back pain    Is supposed to have surgery soon at L4-[facility]   Hypertension    Knee pain    Allergies  Allergen Reactions   Other     Blood products Refusal  Family History  Problem Relation Age of Onset   Cancer Sister        pancreatic   Cancer Maternal Uncle        colon   Cancer Maternal Grandmother        colon   Cancer Cousin        pancreatic    Social History   Socioeconomic History   Marital status: Divorced    Spouse name: Not on file   Number of children: 3   Years of education: Not on file   Highest education level: Not on file  Occupational History   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  Tobacco Use    Smoking status: Former    Packs/day: 0.50    Years: 4.00    Additional pack years: 0.00    Total pack years: 2.00    Types: Cigarettes    Quit date: 04/27/1986    Years since quitting: 36.3   Smokeless tobacco: Never  Vaping Use   Vaping Use: Never used  Substance and Sexual Activity   Alcohol use: Yes    Comment: drinks alcohol 2 days per week    Drug use: No   Sexual activity: Yes  Other Topics Concern   Not on file  Social History Narrative   Not on file   Social Determinants of Health   Financial Resource Strain: Not on file  Food Insecurity: Not on file  Transportation Needs: Not on file  Physical Activity: Not on file  Stress: Not on file  Social Connections: Not on file   Vitals:   09/01/22 1343  BP: 126/80  Pulse: 100  Resp: 16  Temp: 99.1 F (37.3 C)  SpO2: 96%   Body mass index is 43.91 kg/m.  Physical Exam Vitals and nursing note reviewed.  Constitutional:      General: He is not in acute distress.    Appearance: He is well-developed.  HENT:     Head: Normocephalic and atraumatic.     Mouth/Throat:     Dentition: Has dentures.  Eyes:     Conjunctiva/sclera: Conjunctivae normal.  Cardiovascular:     Rate and Rhythm: Normal rate and regular rhythm.     Pulses:          Dorsalis pedis pulses are 2+ on the right side and 2+ on the left side.     Heart sounds: No murmur heard.    Comments: Trace pitting LE edema, bilateral. Pulmonary:     Effort: Pulmonary effort is normal. No respiratory distress.     Breath sounds: Normal breath sounds.  Abdominal:     Palpations: Abdomen is soft. There is no hepatomegaly or mass.     Tenderness: There is no abdominal tenderness.  Lymphadenopathy:     Cervical: No cervical adenopathy.  Skin:    General: Skin is warm.     Findings: No erythema or rash.  Neurological:     Mental Status: He is alert and oriented to person, place, and time.     Cranial Nerves: No cranial nerve deficit.     Gait: Gait normal.   Psychiatric:        Mood and Affect: Mood and affect normal.   ASSESSMENT AND PLAN:  [name] was seen today for establish care.  Diagnoses and all orders for this visit: Lab Results  Component Value Date   HGBA1C 6.9 (A) 09/01/2022   Lab Results  Component Value Date   [employer]  4.4 (H) 09/01/2022   Type 2 diabetes mellitus with other specified complication, without long-term current use of insulin (HCC) Assessment & Plan: New Dx. We discussed Dx,prognosis,and treatment options. HgA1C today 6.9. Recommend non pharmacologic for now, appt with nutritionist/diabetes educator will be arranged. Educated about the importance of appropriate foot care and periodic eye exam. F/U in 3-4 months.  Orders: -     Amb Referral to Nutrition and Diabetic Education -     Microalbumin / creatinine urine ratio; Future -     Accu-Chek Aviva Plus; As directed.  Dispense: 1 kit; Refill: 0  Essential (primary) hypertension Assessment & Plan: BP adequately controlled. Continue Amlodipine 10 mg daily and hydrochlorothiazide 25 mg daily. Low salt diet also recommended. Monitor BP at home. Overdue for eye exam, reports hx of hypertension like retinal changes, ? Retinopathy, over 10 years ago..  Orders: -     amLODIPine Besylate; Take 1 tablet (10 mg total) by mouth daily.  Dispense: 90 tablet; Refill: 1 -     hydroCHLOROthiazide; Take 1 tablet (25 mg total) by mouth daily.  Dispense: 90 tablet; Refill: 1  Hypokalemia Assessment & Plan: He has not picked up Rx for KLOR 20 meq. Would like prescription to be sent to his local pharmacy. He does not want to stop hydrochlorothiazide. He has blood work regularly at his oncologist's office.  Orders: -     Potassium Chloride Crys ER; Take 1 tablet (20 mEq total) by mouth 2 (two) times daily.  Dispense: 30 tablet; Refill: 1  Elevated serum glucose -     POCT glycosylated hemoglobin (Hb A1C)  Aortic atherosclerosis (HCC) Assessment & Plan: He  is not on statin medication. Will hold on adding medications today until I get records with last FLP results from [facility].   I spent a total of 58 minutes in both face to face and non face to face activities for this visit on the date of this encounter. During this time history was obtained and documented, examination was performed, prior labs/imaging reviewed, and assessment/plan discussed.  Return in about 4 months (around 01/01/2023) for chronic problems.  Jonavon Trieu G. [name], MD  [facility]. [place] office.

## 2022-09-01 ENCOUNTER — Encounter: Payer: Self-pay | Admitting: Family Medicine

## 2022-09-01 ENCOUNTER — Encounter: Payer: Self-pay | Admitting: Nurse Practitioner

## 2022-09-01 ENCOUNTER — Ambulatory Visit (INDEPENDENT_AMBULATORY_CARE_PROVIDER_SITE_OTHER): Payer: 59 | Admitting: Family Medicine

## 2022-09-01 VITALS — BP 126/80 | HR 100 | Temp 99.1°F | Resp 16 | Ht 71.5 in | Wt 319.2 lb

## 2022-09-01 DIAGNOSIS — R739 Hyperglycemia, unspecified: Secondary | ICD-10-CM

## 2022-09-01 DIAGNOSIS — I700 Atherosclerosis of aorta: Secondary | ICD-10-CM

## 2022-09-01 DIAGNOSIS — E876 Hypokalemia: Secondary | ICD-10-CM

## 2022-09-01 DIAGNOSIS — E119 Type 2 diabetes mellitus without complications: Secondary | ICD-10-CM | POA: Insufficient documentation

## 2022-09-01 DIAGNOSIS — E1169 Type 2 diabetes mellitus with other specified complication: Secondary | ICD-10-CM

## 2022-09-01 DIAGNOSIS — I10 Essential (primary) hypertension: Secondary | ICD-10-CM | POA: Diagnosis not present

## 2022-09-01 DIAGNOSIS — E1129 Type 2 diabetes mellitus with other diabetic kidney complication: Secondary | ICD-10-CM | POA: Insufficient documentation

## 2022-09-01 LAB — POCT GLYCOSYLATED HEMOGLOBIN (HGB A1C): Hemoglobin A1C: 6.9 % — AB (ref 4.0–5.6)

## 2022-09-01 LAB — MICROALBUMIN / CREATININE URINE RATIO
Creatinine,U: 126.8 mg/dL
Microalb Creat Ratio: 3.5 mg/g (ref 0.0–30.0)
Microalb, Ur: 4.4 mg/dL — ABNORMAL HIGH (ref 0.0–1.9)

## 2022-09-01 MED ORDER — HYDROCHLOROTHIAZIDE 25 MG PO TABS
25.0000 mg | ORAL_TABLET | Freq: Every day | ORAL | 1 refills | Status: DC
Start: 2022-09-01 — End: 2022-09-01

## 2022-09-01 MED ORDER — AMLODIPINE BESYLATE 10 MG PO TABS
10.0000 mg | ORAL_TABLET | Freq: Every day | ORAL | 1 refills | Status: DC
Start: 2022-09-01 — End: 2023-03-21

## 2022-09-01 MED ORDER — POTASSIUM CHLORIDE CRYS ER 20 MEQ PO TBCR
20.0000 meq | EXTENDED_RELEASE_TABLET | Freq: Two times a day (BID) | ORAL | 1 refills | Status: DC
Start: 2022-09-01 — End: 2022-09-01

## 2022-09-01 MED ORDER — ACCU-CHEK AVIVA PLUS W/DEVICE KIT: PACK | 0 refills | Status: DC

## 2022-09-01 MED ORDER — POTASSIUM CHLORIDE CRYS ER 20 MEQ PO TBCR
20.0000 meq | EXTENDED_RELEASE_TABLET | Freq: Two times a day (BID) | ORAL | 1 refills | Status: DC
Start: 2022-09-01 — End: 2023-04-15

## 2022-09-01 MED ORDER — AMLODIPINE BESYLATE 10 MG PO TABS
10.0000 mg | ORAL_TABLET | Freq: Every day | ORAL | 1 refills | Status: DC
Start: 2022-09-01 — End: 2022-09-01

## 2022-09-01 MED ORDER — HYDROCHLOROTHIAZIDE 25 MG PO TABS
25.0000 mg | ORAL_TABLET | Freq: Every day | ORAL | 1 refills | Status: DC
Start: 2022-09-01 — End: 2023-03-07

## 2022-09-01 NOTE — Assessment & Plan Note (Signed)
He has not picked up Rx for KLOR 20 meq. Would like prescription to be sent to his local pharmacy. He does not want to stop hydrochlorothiazide. He has blood work regularly at his oncologist's office.

## 2022-09-01 NOTE — Assessment & Plan Note (Addendum)
BP adequately controlled. Continue Amlodipine 10 mg daily and hydrochlorothiazide 25 mg daily. Low salt diet also recommended. Monitor BP at home. Overdue for eye exam, reports hx of hypertension like retinal changes, ? Retinopathy, over 10 years ago.Marland Kitchen

## 2022-09-01 NOTE — Assessment & Plan Note (Signed)
New Dx. We discussed Dx,prognosis,and treatment options. HgA1C today 6.9. Recommend non pharmacologic for now, appt with nutritionist/diabetes educator will be arranged. Educated about the importance of appropriate foot care and periodic eye exam. F/U in 3-4 months.

## 2022-09-01 NOTE — Patient Instructions (Addendum)
A few things to remember from today's visit:  Essential (primary) hypertension - Plan: amLODipine (NORVASC) 10 MG tablet, hydrochlorothiazide (HYDRODIURIL) 25 MG tablet, DISCONTINUED: amLODipine (NORVASC) 10 MG tablet, DISCONTINUED: hydrochlorothiazide (HYDRODIURIL) 25 MG tablet  Hypokalemia - Plan: potassium chloride SA (KLOR-CON M) 20 MEQ tablet, DISCONTINUED: potassium chloride SA (KLOR-CON M) 20 MEQ tablet  Elevated serum glucose - Plan: POC HgB A1c  Type 2 diabetes mellitus with other specified complication, without long-term current use of insulin (HCC) - Plan: Amb Referral to Nutrition and Diabetic Education, Microalbumin / creatinine urine ratio, Blood Glucose Monitoring Suppl (ACCU-CHEK AVIVA PLUS) w/Device KIT  Hemoglobin A1C 6.9, which means diabetes. Medication is not needed at this time. Appt with nutritionist will be arranged. You need an eye exam.  If you need refills for medications you take chronically, please call your pharmacy. Do not use My Chart to request refills or for acute issues that need immediate attention. If you send a my chart message, it may take a few days to be addressed, specially if I am not in the office.  Please be sure medication list is accurate. If a new problem present, please set up appointment sooner than planned today.

## 2022-09-01 NOTE — Assessment & Plan Note (Signed)
He is not on statin medication. Will hold on adding medications today until I get records with last FLP results from [facility].

## 2022-09-07 ENCOUNTER — Telehealth: Payer: Self-pay

## 2022-09-07 ENCOUNTER — Encounter: Payer: Self-pay | Admitting: Hematology

## 2022-09-07 NOTE — Telephone Encounter (Signed)
Spoke with pt via telephone to inform pt that his recent Guardant Reveal results are back and they were (-) ctDNA Not detected.  Pt was glad to hear the results were negative.  Pt had no further questions or concerns.  Call ended.

## 2022-09-09 LAB — GUARDANT REVEAL

## 2022-09-28 ENCOUNTER — Other Ambulatory Visit: Payer: Self-pay

## 2022-09-28 DIAGNOSIS — C187 Malignant neoplasm of sigmoid colon: Secondary | ICD-10-CM

## 2022-09-28 DIAGNOSIS — D519 Vitamin B12 deficiency anemia, unspecified: Secondary | ICD-10-CM

## 2022-09-29 ENCOUNTER — Inpatient Hospital Stay: Payer: 59

## 2022-09-29 ENCOUNTER — Other Ambulatory Visit: Payer: Self-pay

## 2022-09-29 ENCOUNTER — Encounter: Payer: Self-pay | Admitting: Nurse Practitioner

## 2022-09-29 ENCOUNTER — Inpatient Hospital Stay (HOSPITAL_BASED_OUTPATIENT_CLINIC_OR_DEPARTMENT_OTHER): Payer: 59 | Admitting: Nurse Practitioner

## 2022-09-29 ENCOUNTER — Inpatient Hospital Stay: Payer: 59 | Attending: Nurse Practitioner

## 2022-09-29 VITALS — BP 132/79 | HR 88 | Temp 98.4°F | Resp 17 | Wt 321.4 lb

## 2022-09-29 DIAGNOSIS — D519 Vitamin B12 deficiency anemia, unspecified: Secondary | ICD-10-CM

## 2022-09-29 DIAGNOSIS — E538 Deficiency of other specified B group vitamins: Secondary | ICD-10-CM | POA: Insufficient documentation

## 2022-09-29 DIAGNOSIS — C187 Malignant neoplasm of sigmoid colon: Secondary | ICD-10-CM | POA: Insufficient documentation

## 2022-09-29 DIAGNOSIS — D509 Iron deficiency anemia, unspecified: Secondary | ICD-10-CM | POA: Insufficient documentation

## 2022-09-29 DIAGNOSIS — I700 Atherosclerosis of aorta: Secondary | ICD-10-CM

## 2022-09-29 LAB — CMP (CANCER CENTER ONLY)
ALT: 19 U/L (ref 0–44)
AST: 16 U/L (ref 15–41)
Albumin: 3.9 g/dL (ref 3.5–5.0)
Alkaline Phosphatase: 50 U/L (ref 38–126)
Anion gap: 8 (ref 5–15)
BUN: 16 mg/dL (ref 8–23)
CO2: 31 mmol/L (ref 22–32)
Calcium: 9.2 mg/dL (ref 8.9–10.3)
Chloride: 98 mmol/L (ref 98–111)
Creatinine: 1.29 mg/dL — ABNORMAL HIGH (ref 0.61–1.24)
GFR, Estimated: >60 mL/min (ref >60)
Glucose, Bld: 191 mg/dL — ABNORMAL HIGH (ref 70–99)
Potassium: 3.1 mmol/L — ABNORMAL LOW (ref 3.5–5.1)
Sodium: 137 mmol/L (ref 135–145)
Total Bilirubin: 0.5 mg/dL (ref 0.3–1.2)
Total Protein: 7.7 g/dL (ref 6.5–8.1)

## 2022-09-29 LAB — CBC WITH DIFFERENTIAL (CANCER CENTER ONLY)
Abs Immature Granulocytes: 0.03 10*3/uL (ref 0.00–0.07)
Basophils Absolute: 0.1 10*3/uL (ref 0.0–0.1)
Basophils Relative: 1 %
Eosinophils Absolute: 0.1 10*3/uL (ref 0.0–0.5)
Eosinophils Relative: 2 %
HCT: 44.1 % (ref 39.0–52.0)
Hemoglobin: 14.7 g/dL (ref 13.0–17.0)
Immature Granulocytes: 1 %
Lymphocytes Relative: 31 %
Lymphs Abs: 2.1 10*3/uL (ref 0.7–4.0)
MCH: 25.1 pg — ABNORMAL LOW (ref 26.0–34.0)
MCHC: 33.3 g/dL (ref 30.0–36.0)
MCV: 75.4 fL — ABNORMAL LOW (ref 80.0–100.0)
Monocytes Absolute: 0.6 10*3/uL (ref 0.1–1.0)
Monocytes Relative: 9 %
Neutro Abs: 3.8 10*3/uL (ref 1.7–7.7)
Neutrophils Relative %: 56 %
Platelet Count: 306 10*3/uL (ref 150–400)
RBC: 5.85 MIL/uL — ABNORMAL HIGH (ref 4.22–5.81)
RDW: 17.8 % — ABNORMAL HIGH (ref 11.5–15.5)
WBC Count: 6.7 10*3/uL (ref 4.0–10.5)
nRBC: 0 % (ref 0.0–0.2)

## 2022-09-29 MED ORDER — CYANOCOBALAMIN 1000 MCG/ML IJ SOLN
1000.0000 ug | Freq: Once | INTRAMUSCULAR | Status: AC
  Administered 2022-09-29: 1000 ug via INTRAMUSCULAR
  Filled 2022-09-29: qty 1

## 2022-09-29 NOTE — Patient Instructions (Signed)
Vitamin B12 Deficiency Vitamin B12 deficiency occurs when the body does not have enough of this important vitamin. The body needs this vitamin: To make red blood cells. To make DNA. This is the genetic material inside cells. To help the nerves work properly so they can carry messages from the brain to the body. Vitamin B12 deficiency can cause health problems, such as not having enough red blood cells in the blood (anemia). This can lead to nerve damage if untreated. What are the causes? This condition may be caused by: Not eating enough foods that contain vitamin B12. Not having enough stomach acid and digestive fluids to properly absorb vitamin B12 from the food that you eat. Having certain diseases that make it hard to absorb vitamin B12. These diseases include Crohn's disease, chronic pancreatitis, and cystic fibrosis. An autoimmune disorder in which the body does not make enough of a protein (intrinsic factor) within the stomach, resulting in not enough absorption of vitamin B12. Having a surgery in which part of the stomach or small intestine is removed. Taking certain medicines that make it hard for the body to absorb vitamin B12. These include: Heartburn medicines, such as antacids and proton pump inhibitors. Some medicines that are used to treat diabetes. What increases the risk? The following factors may make you more likely to develop a vitamin B12 deficiency: Being an older adult. Eating a vegetarian or vegan diet that does not include any foods that come from animals. Eating a poor diet while you are pregnant. Taking certain medicines. Having alcoholism. What are the signs or symptoms? In some cases, there are no symptoms of this condition. If the condition leads to anemia or nerve damage, various symptoms may occur, such as: Weakness. Tiredness (fatigue). Loss of appetite. Numbness or tingling in your hands and feet. Redness and burning of the tongue. Depression,  confusion, or memory problems. Trouble walking. If anemia is severe, symptoms can include: Shortness of breath. Dizziness. Rapid heart rate. How is this diagnosed? This condition may be diagnosed with a blood test to measure the level of vitamin B12 in your blood. You may also have other tests, including: A group of tests that measure certain characteristics of blood cells (complete blood count, CBC). A blood test to measure intrinsic factor. A procedure where a thin tube with a camera on the end is used to look into your stomach or intestines (endoscopy). Other tests may be needed to discover the cause of the deficiency. How is this treated? Treatment for this condition depends on the cause. This condition may be treated by: Changing your eating and drinking habits, such as: Eating more foods that contain vitamin B12. Drinking less alcohol or no alcohol. Getting vitamin B12 injections. Taking vitamin B12 supplements by mouth (orally). Your health care provider will tell you which dose is best for you. Follow these instructions at home: Eating and drinking  Include foods in your diet that come from animals and contain a lot of vitamin B12. These include: Meats and poultry. This includes beef, pork, chicken, turkey, and organ meats, such as liver. Seafood. This includes clams, rainbow trout, salmon, tuna, and haddock. Eggs. Dairy foods such as milk, yogurt, and cheese. Eat foods that have vitamin B12 added to them (are fortified), such as ready-to-eat breakfast cereals. Check the label on the package to see if a food is fortified. The items listed above may not be a complete list of foods and beverages you can eat and drink. Contact a dietitian for   more information. Alcohol use Do not drink alcohol if: Your health care provider tells you not to drink. You are pregnant, may be pregnant, or are planning to become pregnant. If you drink alcohol: Limit how much you have to: 0-1 drink a  day for women. 0-2 drinks a day for men. Know how much alcohol is in your drink. In the [place] one drink equals one 12 oz bottle of beer (355 mL), one 5 oz glass of wine (148 mL), or one 1 oz glass of hard liquor (44 mL). General instructions Get vitamin B12 injections if told to by your health care provider. Take supplements only as told by your health care provider. Follow the directions carefully. Keep all follow-up visits. This is important. Contact a health care provider if: Your symptoms come back. Your symptoms get worse or do not improve with treatment. Get help right away: You develop shortness of breath. You have a rapid heart rate. You have chest pain. You become dizzy or you faint. These symptoms may be an emergency. Get help right away. Call 911. Do not wait to see if the symptoms will go away. Do not drive yourself to the hospital. Summary Vitamin B12 deficiency occurs when the body does not have enough of this important vitamin. Common causes include not eating enough foods that contain vitamin B12, not being able to absorb vitamin B12 from the food that you eat, having a surgery in which part of the stomach or small intestine is removed, or taking certain medicines. Eat foods that have vitamin B12 in them. Treatment may include making a change in the way you eat and drink, getting vitamin B12 injections, or taking vitamin B12 supplements. This information is not intended to replace advice given to you by your health care provider. Make sure you discuss any questions you have with your health care provider. Document Revised: 10/24/2020 Document Reviewed: 10/24/2020 Elsevier Patient Education  2024 [employer]  

## 2022-09-29 NOTE — Progress Notes (Signed)
Patient Care Team: [name], [name], MD as PCP - General (Family Medicine) Pollyann Samples, NP as Nurse Practitioner (Oncology) Malachy Mood, MD as Consulting Physician (Hematology and Oncology) Romie Levee, MD as Consulting Physician (General Surgery) Eleanora Neighbor, MD as Referring Physician (Gastroenterology)   CHIEF COMPLAINT: Follow-up colon cancer  Oncology History Overview Note   Cancer Staging  Cancer of sigmoid colon [facility]) Staging form: Colon and Rectum, AJCC 8th Edition - Pathologic stage from 06/20/2020: Stage IIA (pT3, pN0, cM0) - Signed by Pollyann Samples, NP on 07/21/2020    Cancer of sigmoid colon (HCC)  05/15/2020 Procedure   Colonoscopy by Dr. Al Corpus Shahid: obstructing mass found in the sigmoid colon located 35 cm from the anus.  Unable to advance past the mass.  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  No metastatic disease in the chest, abdomen, pelvis   06/12/2020 Tumor Marker   Baseline CEA 6.1   06/20/2020 Initial Diagnosis   Cancer of sigmoid colon (HCC)   06/20/2020 Cancer Staging   Staging form: Colon and Rectum, AJCC 8th Edition - Pathologic stage from 06/20/2020: Stage IIA (pT3, pN0, cM0) - Signed by Pollyann Samples, NP on 07/21/2020 Stage prefix: Initial diagnosis Total positive nodes: 0 Histologic grading system: 4 grade system Histologic grade (G): G2   06/20/2020 Definitive Surgery   Laparoscopic partial colectomy, small bowel resection by Dr. Maisie Fus   06/20/2020 Pathology Results   FINAL MICROSCOPIC DIAGNOSIS:   A. COLON, SIGMOID AND ATTACHED SMALL  BOWEL, RESECTION:  -  Adenocarcinoma, moderately differentiated, 12.7 cm  -  No carcinoma identified in twenty-four lymph nodes (0/24)  -  Margins uninvolved by carcinoma (0.2 cm; mesenteric margin)  -  Sessile serrated polyp  -  See oncology table and comment below   B. COLON, ADDITIONAL PROXIMAL MARGIN, RESECTION:  -  No residual adenocarcinoma identified  -  No carcinoma identified in three lymph nodes (0/3)   MMR normal, MSI-stable   05/22/2021 Imaging   EXAM: CT CHEST, ABDOMEN, AND PELVIS WITH CONTRAST  IMPRESSION: 1. Prior partial sigmoidectomy with anastomotic sutures in the superior pelvis, without evidence of local recurrence/residual disease. 2. No convincing evidence of metastatic disease in the chest, abdomen or pelvis. 3. Prominent left common iliac lymph node measures 8 mm, is nonspecific. Attention on follow-up imaging suggested. 4. Generalized decrease in hepatic parenchymal attenuation with focal geographic areas of relative hyperattenuation, almost certainly reflects diffuse hepatic steatosis with focal fatty sparing. This could be more definitively characterized with hepatic protocol abdominal MRI with and without contrast if clinically indicated. 5.  Aortic Atherosclerosis (ICD10-I70.0).      CURRENT THERAPY: Surveillance  INTERVAL HISTORY Mr. Albarran returns for follow-up as scheduled, last seen by me 03/30/2022.  Last guardant reveal/ctDNA in June was not detected.  Doing well overall, no specific concerns or changes in his health except plantar fasciitis in his foot.  He has been told he is prediabetic and to adjust his diet.  He remains busy, enjoys time with his family and playing the guitar.  Denies unintentional weight loss, abdominal pain/bloating, change in bowel habits, or bloody  stools.  ROS  All other systems reviewed and negative  Past Medical History:  Diagnosis Date   Arthritis    Cancer (HCC)    colon   Chronic low back pain    Is  supposed to have surgery soon at L4-5   Diabetes mellitus without complication (HCC)    Dx'ed 09/01/22 with HgA1C 6.9   Hypertension    Knee pain      Past Surgical History:  Procedure Laterality Date   JOINT REPLACEMENT     knee 05/2011   KNEE SURGERY  08   rt arthroscopy   LAPAROSCOPIC PARTIAL COLECTOMY N/A 06/20/2020   Procedure: LAPAROSCOPIC PARTIAL COLECTOMY;  Surgeon: Romie Levee, MD;  Location: [facility];  Service: General;  Laterality: N/A;   TOTAL KNEE ARTHROPLASTY  05/05/2011   Procedure: TOTAL KNEE ARTHROPLASTY;  Surgeon: Loreta Ave, MD;  Location: [facility] OR;  Service: Orthopedics;  Laterality: Right;  DR MURPHY WANTS 90 MINUTES FOR THIS CASE     Outpatient Encounter Medications as of 09/29/2022  Medication Sig   amLODipine (NORVASC) 10 MG tablet Take 1 tablet (10 mg total) by mouth daily.   hydrochlorothiazide (HYDRODIURIL) 25 MG tablet Take 1 tablet (25 mg total) by mouth daily.   potassium chloride SA (KLOR-CON M) 20 MEQ tablet Take 1 tablet (20 mEq total) by mouth 2 (two) times daily.   Blood Glucose Monitoring Suppl (ACCU-CHEK AVIVA PLUS) w/Device KIT As directed.   [DISCONTINUED] cloNIDine (CATAPRES) 0.1 MG tablet Take 0.1 mg by mouth 2 (two) times daily.    No facility-administered encounter medications on file as of 09/29/2022.     Today's Vitals   09/29/22 1059  BP: 132/79  Pulse: 88  Resp: 17  Temp: 98.4 F (36.9 C)  TempSrc: Oral  SpO2: 98%  Weight: (!) 321 lb 6.4 oz (145.8 kg)  PainSc: 0-No pain   Body mass index is 44.2 kg/m.   PHYSICAL EXAM GENERAL:alert, no distress and comfortable SKIN: no rash  EYES: sclera clear LUNGS: clear with normal breathing effort HEART: regular rate & rhythm, no lower extremity edema ABDOMEN: abdomen soft, non-tender and normal bowel sounds NEURO: alert & oriented x 3 with fluent speech, no focal motor/sensory deficits   CBC    Component Value Date/Time   WBC 6.7 09/29/2022 1002   WBC 8.5 06/12/2020 1427   RBC  5.85 (H) 09/29/2022 1002   HGB 14.7 09/29/2022 1002   HCT 44.1 09/29/2022 1002   HCT 44.7 11/17/2021 1042   PLT 306 09/29/2022 1002   MCV 75.4 (L) 09/29/2022 1002   MCH 25.1 (L) 09/29/2022 1002   MCHC 33.3 09/29/2022 1002   RDW 17.8 (H) 09/29/2022 1002   LYMPHSABS 2.1 09/29/2022 1002   MONOABS 0.6 09/29/2022 1002   EOSABS 0.1 09/29/2022 1002   BASOSABS 0.1 09/29/2022 1002     CMP     Component Value Date/Time   NA 137 09/29/2022 1002   K 3.1 (L) 09/29/2022 1002   CL 98 09/29/2022 1002   CO2 31 09/29/2022 1002   GLUCOSE 191 (H) 09/29/2022 1002   BUN 16 09/29/2022 1002   CREATININE 1.29 (H) 09/29/2022 1002   CALCIUM 9.2 09/29/2022 1002   PROT 7.7 09/29/2022 1002   ALBUMIN 3.9 09/29/2022 1002   AST 16 09/29/2022 1002   ALT 19 09/29/2022 1002   ALKPHOS 50 09/29/2022 1002   BILITOT 0.5 09/29/2022 1002   GFRNONAA >60 09/29/2022 1002   GFRAA >90 05/07/2011 0515  ASSESSMENT & PLAN:Indigo Carmon Ginsberg Cafarelli is a 67 y.o. male with    1.  Adenocarcinoma of the sigmoid colon, moderately differentiated G2, pT3pN0M0 stage IIA, MMR normal, MSI-stable -He presented with 2-6 months bloody diarrhea, fatigue, and 20 lbs weight loss. 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 Baseline CEA mildly elevated 6.1, staging work up negative for metastatic disease.  -S/p partial colectomy 06/20/20 by Dr. Maisie Fus, surgical path showed 12.7 cm carcinoma was removed, 0/27 LNs+, margins clear. There were no high risk features such as perforation, PNI, or LVI. -Given stage II colon cancer, adjuvant chemotherapy is not recommended. -The recurrence risk is moderate.   -ctDNA not detected (07/21/2020, 08/25/2020, 10/27/2020, 01/26/2021 and 05/25/2021, 02/2022, 08/2022) -Surveillance colonoscopy 11/2021 (Dr. Cloretta Ned at [facility]) was negative, 3-year recall -Surveillance CT 07/08/22 NED -Mr. Foti is clinically doing well.  Exam is benign, labs are stable.  K3.1 he takes 10 mEq twice daily, I recommend to  increase to 20 mEq twice daily.  No clinical concern for colon cancer recurrence -Continue surveillance, will do scans as needed from now. -Follow-up in 6 months, or sooner if needed   2.  Family history, genetics -He tells me that a first cousin and his half sister have pancreatic cancer, which was not known at initial consult -I reviewed the guidelines for genetic testing in those with family history of pancreatic cancer.  Given that he has 2 relatives with pancreatic cancer, he qualifies -Previously declined genetic testing    3.  Microcytic anemia, secondary to tumor bleeding; B12 and folic acid deficiency  -Hgb 9.5 on 3/31 pre-op; MCV 69 with thrombocytosis. Likely secondary to tumor bleeding. Work up showed serum iron 34, TIBC 144, ferritin 125; he was found to have low folate 3.5 and low B12 103 -He is Jehovah's witness, does not accept blood products.  -B12 and folate WNL since 10/2020 -He began B12 injections in 2022, B12 level normalized.  He prefers to continue monthly B12 injection rather than take oral pill  -He has mild persistent microcytosis without anemia    PLAN: -Recent CT, guardant reveal, and today's labs reviewed -Continue colon cancer surveillance -Monthly B12 injection -Increase K supplement to 20 mEq twice daily -Follow-up in 6 months, or sooner if needed    All questions were answered. The patient knows to call the clinic with any problems, questions or concerns. No barriers to learning were detected.   Santiago Glad, NP-C 09/29/2022

## 2022-10-05 ENCOUNTER — Encounter: Payer: Self-pay | Admitting: Family Medicine

## 2022-10-05 ENCOUNTER — Telehealth (INDEPENDENT_AMBULATORY_CARE_PROVIDER_SITE_OTHER): Payer: 59 | Admitting: Family Medicine

## 2022-10-05 VITALS — BP 120/80 | Ht 71.5 in | Wt 318.0 lb

## 2022-10-05 DIAGNOSIS — Z0000 Encounter for general adult medical examination without abnormal findings: Secondary | ICD-10-CM

## 2022-10-05 NOTE — Patient Instructions (Signed)
I really enjoyed getting to talk with you today! I am available on Tuesdays and Thursdays for virtual visits if you have any questions or concerns, or if I can be of any further assistance.   CHECKLIST FROM ANNUAL WELLNESS VISIT:  -Follow up (please call to schedule if not scheduled after visit):   -yearly for annual wellness visit with primary care office  Here is a list of your preventive care/health maintenance measures and the plan for each if any are due:  PLAN For any measures below that may be due:  -please check with your insurance and set up a diabetic eye exam yearly -please consider the vaccines and can get at the pharmacy -discuss prostate cancer screening with Dr. Thomasene Lot  Health Maintenance  Topic Date Due   OPHTHALMOLOGY EXAM  Never done   Hepatitis C Screening  Never done   DTaP/Tdap/Td (1 - Tdap) Never done   Zoster Vaccines- Shingrix (1 of 2) Never done   COVID-19 Vaccine (3 - Pfizer risk series) 06/11/2019   Pneumonia Vaccine 78+ Years old (1 of 1 - PCV) 09/01/2023 (Originally 02/09/2020)   INFLUENZA VACCINE  10/14/2022   HEMOGLOBIN A1C  03/03/2023   Diabetic kidney evaluation - Urine ACR  09/01/2023   FOOT EXAM  09/01/2023   Diabetic kidney evaluation - eGFR measurement  09/29/2023   Medicare Annual Wellness (AWV)  10/05/2023   Colonoscopy  11/21/2031   HPV VACCINES  Aged Out    -See a dentist at least yearly  -Get your eyes checked and then per your eye specialist's recommendations  -Other issues addressed today:   -I have included below further information regarding a healthy whole foods based diet, physical activity guidelines for adults, stress management and opportunities for social connections. I hope you find this information useful.    -----------------------------------------------------------------------------------------------------------------------------------------------------------------------------------------------------------------------------------------------------------  NUTRITION: -eat real food: lots of colorful vegetables (half the plate) and fruits -5-7 servings of vegetables and fruits per day (fresh or steamed is best), exp. 2 servings of vegetables with lunch and dinner and 2 servings of fruit per day. Berries and greens such as kale and collards are great choices.  -consume on a regular basis: whole grains (make sure first ingredient on label contains the word "whole"), fresh fruits, fish, nuts, seeds, healthy oils (such as olive oil, avocado oil, grape seed oil) -may eat small amounts of dairy and lean meat on occasion, but avoid processed meats such as ham, bacon, lunch meat, etc. -drink water -try to avoid fast food and pre-packaged foods, processed meat -most experts advise limiting sodium to < 2300mg  per day, should limit further is any chronic conditions such as high blood pressure, heart disease, diabetes, etc. The American Heart Association advised that < 1500mg  is is ideal -try to avoid foods that contain any ingredients with names you do not recognize  -try to avoid sugar/sweets (except for the natural sugar that occurs in fresh fruit) -try to avoid sweet drinks -try to avoid white rice, white bread, pasta (unless whole grain), white or yellow potatoes  EXERCISE GUIDELINES FOR ADULTS: -if you wish to increase your physical activity, do so gradually and with the approval of your doctor -STOP and seek medical care immediately if you have any chest pain, chest discomfort or trouble breathing when starting or increasing exercise  -move and stretch your body, legs, feet and arms when sitting for long periods -Physical activity guidelines for optimal health in adults: -least 150 minutes per week of  aerobic exercise (can talk,  but not sing) once approved by your doctor, 20-30 minutes of sustained activity or two 10 minute episodes of sustained activity every day.  -resistance training at least 2 days per week if approved by your doctor -balance exercises 3+ days per week:   Stand somewhere where you have something sturdy to hold onto if you lose balance.    1) lift up on toes, start with 5x per day and work up to 20x   2) stand and lift on leg straight out to the side so that foot is a few inches of the floor, start with 5x each side and work up to 20x each side   3) stand on one foot, start with 5 seconds each side and work up to 20 seconds on each side  If you need ideas or help with getting more active:  -Silver sneakers https://tools.silversneakers.com  -Walk with a Doc: http://www.duncan-williams.com/  -try to include resistance (weight lifting/strength building) and balance exercises twice per week: or the following link for ideas: http://castillo-powell.com/  BuyDucts.dk  STRESS MANAGEMENT: -can try meditating, or just sitting quietly with deep breathing while intentionally relaxing all parts of your body for 5 minutes daily -if you need further help with stress, anxiety or depression please follow up with your primary doctor or contact the wonderful folks at [facility]: [phone]  SOCIAL CONNECTIONS: -options in [place] if you wish to engage in more social and exercise related activities:  -Silver sneakers https://tools.silversneakers.com  -Walk with a Doc: http://www.duncan-williams.com/  -Check out the [facility] Active Adults 50+ section on the [place] [employer] (hiking clubs, book clubs, cards and games, chess, exercise classes, aquatic classes and much more) - see the website for  details: https://www.[place].gov/departments/parks-recreation/active-adults50  -YouTube has lots of exercise videos for different ages and abilities as well  -[facility] (a variety of indoor and outdoor inperson activities for adults). [phone]. [address]  -Virtual Online Classes (a variety of topics): see seniorplanet.org or call [phone]  -consider volunteering at a school, hospice center, church, senior center or elsewhere    ADVANCED HEALTHCARE DIRECTIVES:  Everyone should have advanced health care directives in place. This is so that you get the care you want, should you ever be in a situation where you are unable to make your own medical decisions.   From the [place] Advanced Directive Website: "Advance Health Care Directives are legal documents in which you give written instructions about your health care if, in the future, you cannot speak for yourself.   A health care power of attorney allows you to name a person you trust to make your health care decisions if you cannot make them yourself. A declaration of a desire for a natural death (or living will) is document, which states that you desire not to have your life prolonged by extraordinary measures if you have a terminal or incurable illness or if you are in a vegetative state. An advance instruction for mental health treatment makes a declaration of instructions, information and preferences regarding your mental health treatment. It also states that you are aware that the advance instruction authorizes a mental health treatment provider to act according to your wishes. It may also outline your consent or refusal of mental health treatment. A declaration of an anatomical gift allows anyone over the age of 33 to make a gift by will, organ donor card or other document."   Please see the following website or an elder law attorney for forms, FAQs and  for completion of advanced directives: [place]  TEFL teacher Health Care Directives Advance Health Care Directives (http://guzman.com/)  Or copy and paste the following to your web browser: PoshChat.fi

## 2022-10-05 NOTE — Progress Notes (Signed)
PATIENT CHECK-IN and HEALTH RISK ASSESSMENT QUESTIONNAIRE:  -completed by phone/video for upcoming Medicare Preventive Visit  Pre-Visit Check-in: 1)Vitals (height, wt, BP, etc) - record in vitals section for visit on day of visit 2)Review and Update Medications, Allergies PMH, Surgeries, Social history in Epic 3)Hospitalizations in the last year with date/reason? no  4)Review and Update Care Team (patient's specialists) in Epic 5) Complete PHQ9 in Epic  6) Complete Fall Screening in Epic 7)Review all Health Maintenance Due and order under PCP if not done.  Medicare Wellness Patient Questionnaire:  Answer theses question about your habits: Do you drink alcohol? yes If yes, how many drinks do you have a day?1  Have you ever smoked?yes Quit date if applicable? 1988  How many packs a day do/did you smoke? 1 pack Do you use smokeless tobacco?no Do you use an illicit drugs?did Do you exercises? No Are you sexually active? No Number of partners? Typical breakfast - eggs or muffin Typical lunch- chicken or fruit Typical dinner- pork chop, potato, chicken Typical snacks: fruit  Beverages: coffee, juice  Answer theses question about you: Can you perform most household chores?yes Do you find it hard to follow a conversation in a noisy room? no Do you often ask people to speak up or repeat themselves?no Do you feel that you have a problem with memory?no Do you balance your checkbook and or bank acounts?yes Do you feel safe at home?yes Last dentist visit? Patient has dentures x 2 years Do you need assistance with any of the following: Please note if so no  Driving?  Feeding yourself?  Getting from bed to chair?  Getting to the toilet?  Bathing or showering?  Dressing yourself?  Managing money?  Climbing a flight of stairs  Preparing meals?    Do you have Advanced Directives in place (Living Will, Healthcare Power or Attorney)? no   Last eye Exam and location?Dr Hyacinth Meeker a few  years   Do you currently use prescribed or non-prescribed narcotic or opioid pain medications? no  Do you have a history or close family history of breast, ovarian, tubal or peritoneal cancer or a family member with BRCA (breast cancer susceptibility 1 and 2) gene mutations? Sister pancreatic cancer  Nurse/Assistant Credentials/time stamp:   ---rachel vereen cma-------------------------------------------------------------------------------------------------------------------------------------------------------------------------------------------------------------------    MEDICARE ANNUAL PREVENTIVE CARE VISIT WITH PROVIDER (Welcome to [employer], initial annual wellness or annual wellness exam)  Virtual Visit via Phone Note  I connected with Anurag Scarfo on 10/05/22  by phone and verified that I am speaking with the correct person using two identifiers.  Location patient: home Location provider:work or home office Persons participating in the virtual visit: patient, provider  Concerns and/or follow up today: saw his oncologist a few weeks ago and got a good report.    See HM section in Epic for other details of completed HM.    ROS: negative for report of fevers, unintentional weight loss, vision changes, vision loss, hearing loss or change, chest pain, sob, hemoptysis, melena, hematochezia, hematuria, falls, bleeding or bruising, thoughts of suicide or self harm, memory loss  Patient-completed extensive health risk assessment - reviewed and discussed with the patient: See Health Risk Assessment completed with patient prior to the visit either above or in recent phone note. This was reviewed in detailed with the patient today and appropriate recommendations, orders and referrals were placed as needed per Summary below and patient instructions.   Review of Medical History: -PMH, PSH, Family History and current specialty and care  providers reviewed and updated and listed below    Patient Care Team: [name], [name], MD as PCP - General (Family Medicine) Pollyann Samples, NP as Nurse Practitioner (Oncology) Malachy Mood, MD as Consulting Physician (Hematology and Oncology) Romie Levee, MD as Consulting Physician (General Surgery) Eleanora Neighbor, MD as Referring Physician (Gastroenterology)   Past Medical History:  Diagnosis Date   Arthritis    Cancer [facility])    colon   Chronic low back pain    Is supposed to have surgery soon at L4-5   Diabetes mellitus without complication (HCC)    Dx'ed 09/01/22 with HgA1C 6.9   Hypertension    Knee pain     Past Surgical History:  Procedure Laterality Date   JOINT REPLACEMENT     knee 05/2011   KNEE SURGERY  08   rt arthroscopy   LAPAROSCOPIC PARTIAL COLECTOMY N/A 06/20/2020   Procedure: LAPAROSCOPIC PARTIAL COLECTOMY;  Surgeon: Romie Levee, MD;  Location: [facility] ORS;  Service: General;  Laterality: N/A;   TOTAL KNEE ARTHROPLASTY  05/05/2011   Procedure: TOTAL KNEE ARTHROPLASTY;  Surgeon: Loreta Ave, MD;  Location: [facility] OR;  Service: Orthopedics;  Laterality: Right;  DR MURPHY WANTS 90 MINUTES FOR THIS CASE    Social History   Socioeconomic History   Marital status: Legally Separated    Spouse name: Not on file   Number of children: 3   Years of education: Not on file   Highest education level: Not on file  Occupational History   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  Tobacco Use   Smoking status: Former    Current packs/day: 0.00    Average packs/day: 0.5 packs/day for 4.0 years (2.0 ttl pk-yrs)    Types: Cigarettes    Start date: 04/27/1982    Quit date: 04/27/1986    Years since quitting: 36.4   Smokeless tobacco: Never  Vaping Use   Vaping status: Never Used  Substance and Sexual Activity   Alcohol use: Yes    Comment: drinks alcohol 2 days per week    Drug use: No   Sexual activity: Yes  Other Topics Concern   Not on file  Social History Narrative   Not on file   Social  Determinants of Health   Financial Resource Strain: Low Risk  (10/05/2022)   Overall Financial Resource Strain (CARDIA)    Difficulty of Paying Living Expenses: Not hard at all  Food Insecurity: No Food Insecurity (10/05/2022)   Hunger Vital Sign    Worried About Running Out of Food in the Last Year: Never true    Ran Out of Food in the Last Year: Never true  Transportation Needs: No Transportation Needs (10/05/2022)   PRAPARE - Administrator, Civil Service (Medical): No    Lack of Transportation (Non-Medical): No  Physical Activity: Inactive (10/05/2022)   Exercise Vital Sign    Days of Exercise per Week: 0 days    Minutes of Exercise per Session: 0 min  Stress: No Stress Concern Present (10/05/2022)   [employer] of Occupational Health - Occupational Stress Questionnaire    Feeling of Stress : Not at all  Social Connections: Socially Isolated (10/05/2022)   Social Connection and Isolation Panel [NHANES]    Frequency of Communication with Friends and Family: More than three times a week    Frequency of Social Gatherings with Friends and Family: Twice a week    Attends Religious Services: Never  Active Member of Clubs or Organizations: No    Attends Banker Meetings: Never    Marital Status: Separated  Intimate Partner Violence: Not At Risk (10/05/2022)   Humiliation, Afraid, Rape, and Kick questionnaire    Fear of Current or Ex-Partner: No    Emotionally Abused: No    Physically Abused: No    Sexually Abused: No    Family History  Problem Relation Age of Onset   Cancer Sister        pancreatic   Cancer Maternal Uncle        colon   Cancer Maternal Grandmother        colon   Cancer Cousin        pancreatic    Current Outpatient Medications on File Prior to Visit  Medication Sig Dispense Refill   amLODipine (NORVASC) 10 MG tablet Take 1 tablet (10 mg total) by mouth daily. 90 tablet 1   Blood Glucose Monitoring Suppl (ACCU-CHEK AVIVA PLUS)  w/Device KIT As directed. 1 kit 0   hydrochlorothiazide (HYDRODIURIL) 25 MG tablet Take 1 tablet (25 mg total) by mouth daily. 90 tablet 1   potassium chloride SA (KLOR-CON M) 20 MEQ tablet Take 1 tablet (20 mEq total) by mouth 2 (two) times daily. 30 tablet 1   [DISCONTINUED] cloNIDine (CATAPRES) 0.1 MG tablet Take 0.1 mg by mouth 2 (two) times daily.      No current facility-administered medications on file prior to visit.    Allergies  Allergen Reactions   Other     Blood products Refusal        Physical Exam Vitals requested from patient and listed below if patient had equipment and was able to obtain at home for this virtual visit: Vitals:   10/05/22 1139  BP: 120/80   Estimated body mass index is 43.73 kg/m as calculated from the following:   Height as of this encounter: 5' 11.5" (1.816 m).   Weight as of this encounter: 318 lb (144.2 kg).  EKG (optional): deferred due to virtual visit  GENERAL: alert, oriented, no acute distress detected; full vision exam deferred due to pandemic and/or virtual encounter  PSYCH/NEURO: pleasant and cooperative, no obvious depression or anxiety, speech and thought processing grossly intact, Cognitive function grossly intact  Flowsheet Row Video Visit from 10/05/2022 in [facility] [place]  PHQ-9 Total Score 0           10/05/2022   11:43 AM 09/01/2022    1:50 PM  Depression screen PHQ 2/9  Decreased Interest 0 0  Down, Depressed, Hopeless 0 0  PHQ - 2 Score 0 0  Altered sleeping 0   Tired, decreased energy 0   Change in appetite 0   Feeling bad or failure about yourself  0   Trouble concentrating 0   Moving slowly or fidgety/restless 0   Suicidal thoughts 0   PHQ-9 Score 0   Difficult doing work/chores Not difficult at all        07/21/2020   10:54 AM 10/27/2020   10:26 AM 11/17/2021   11:04 AM 09/01/2022    1:50 PM 10/05/2022   11:42 AM  Fall Risk  Falls in the past year?    0 0  Was there an  injury with Fall?    0 0  Fall Risk Category Calculator    0 0  (RETIRED) Patient Fall Risk Level Low fall risk Low fall risk Low fall risk    Patient  at Risk for Falls Due to    Other (Comment)   Fall risk Follow up    Falls evaluation completed Falls evaluation completed     SUMMARY AND PLAN:  Encounter for Medicare annual wellness exam   Discussed applicable health maintenance/preventive health measures and advised and referred or ordered per patient preferences: -discussed importance of the eye exam, vaccines due, etc -he plans to discuss prostate ca screening more with PCP Health Maintenance  Topic Date Due   OPHTHALMOLOGY EXAM  Never done   Hepatitis C Screening  Never done   DTaP/Tdap/Td (1 - Tdap) Never done   Zoster Vaccines- Shingrix (1 of 2) Never done   COVID-19 Vaccine (3 - Pfizer risk series) 06/11/2019   Pneumonia Vaccine 63+ Years old (1 of 1 - PCV) 09/01/2023 (Originally 02/09/2020)   INFLUENZA VACCINE  10/14/2022   HEMOGLOBIN A1C  03/03/2023   Diabetic kidney evaluation - Urine ACR  09/01/2023   FOOT EXAM  09/01/2023   Diabetic kidney evaluation - eGFR measurement  09/29/2023   Medicare Annual Wellness (AWV)  10/05/2023   Colonoscopy  11/21/2031   HPV VACCINES  Aged [employer] and counseling on the following was provided based on the above review of health and a plan/checklist for the patient, along with additional information discussed, was provided for the patient in the patient instructions :  -Advised on importance of completing advanced directives, discussed options for completing and provided information in patient instructions as well -Advised and counseled on a healthy lifestyle - including the importance of a healthy diet, regular physical activity, social connections and stress management. -Reviewed patient's current diet. Advised and counseled on a whole foods based healthy diet for diabetes at length. He feels he can cut back on cake and sweet  beverages and make healthier choices. Recently dx with diabetes and reports is set up to meet with dietician as well. A summary of a healthy diet was provided in the Patient Instructions.  -reviewed patient's current physical activity level and discussed exercise guidelines for adults. Discussed community resources and ideas for safe exercise at home to assist in meeting exercise guideline recommendations in a safe and healthy way. He feels he can start walking about 10-15 minutes per day. Was walking 6 miles per day before covid.  -Advise yearly dental visits at minimum and regular eye exams   Follow up: see patient instructions   Patient Instructions  I really enjoyed getting to talk with you today! I am available on Tuesdays and Thursdays for virtual visits if you have any questions or concerns, or if I can be of any further assistance.   CHECKLIST FROM ANNUAL WELLNESS VISIT:  -Follow up (please call to schedule if not scheduled after visit):   -yearly for annual wellness visit with primary care office  Here is a list of your preventive care/health maintenance measures and the plan for each if any are due:  PLAN For any measures below that may be due:  -please check with your insurance and set up a diabetic eye exam yearly -please consider the vaccines and can get at the pharmacy -discuss prostate cancer screening with Dr. Thomasene Lot  Health Maintenance  Topic Date Due   OPHTHALMOLOGY EXAM  Never done   Hepatitis C Screening  Never done   DTaP/Tdap/Td (1 - Tdap) Never done   Zoster Vaccines- Shingrix (1 of 2) Never done   COVID-19 Vaccine (3 - Pfizer risk series) 06/11/2019   Pneumonia Vaccine 65+  Years old (1 of 1 - PCV) 09/01/2023 (Originally 02/09/2020)   INFLUENZA VACCINE  10/14/2022   HEMOGLOBIN A1C  03/03/2023   Diabetic kidney evaluation - Urine ACR  09/01/2023   FOOT EXAM  09/01/2023   Diabetic kidney evaluation - eGFR measurement  09/29/2023   Medicare Annual Wellness  (AWV)  10/05/2023   Colonoscopy  11/21/2031   HPV VACCINES  Aged Out    -See a dentist at least yearly  -Get your eyes checked and then per your eye specialist's recommendations  -Other issues addressed today:   -I have included below further information regarding a healthy whole foods based diet, physical activity guidelines for adults, stress management and opportunities for social connections. I hope you find this information useful.   -----------------------------------------------------------------------------------------------------------------------------------------------------------------------------------------------------------------------------------------------------------  NUTRITION: -eat real food: lots of colorful vegetables (half the plate) and fruits -5-7 servings of vegetables and fruits per day (fresh or steamed is best), exp. 2 servings of vegetables with lunch and dinner and 2 servings of fruit per day. Berries and greens such as kale and collards are great choices.  -consume on a regular basis: whole grains (make sure first ingredient on label contains the word "whole"), fresh fruits, fish, nuts, seeds, healthy oils (such as olive oil, avocado oil, grape seed oil) -may eat small amounts of dairy and lean meat on occasion, but avoid processed meats such as ham, bacon, lunch meat, etc. -drink water -try to avoid fast food and pre-packaged foods, processed meat -most experts advise limiting sodium to < 2300mg  per day, should limit further is any chronic conditions such as high blood pressure, heart disease, diabetes, etc. The American Heart Association advised that < 1500mg  is is ideal -try to avoid foods that contain any ingredients with names you do not recognize  -try to avoid sugar/sweets (except for the natural sugar that occurs in fresh fruit) -try to avoid sweet drinks -try to avoid white rice, white bread, pasta (unless whole grain), white or yellow  potatoes  EXERCISE GUIDELINES FOR ADULTS: -if you wish to increase your physical activity, do so gradually and with the approval of your doctor -STOP and seek medical care immediately if you have any chest pain, chest discomfort or trouble breathing when starting or increasing exercise  -move and stretch your body, legs, feet and arms when sitting for long periods -Physical activity guidelines for optimal health in adults: -least 150 minutes per week of aerobic exercise (can talk, but not sing) once approved by your doctor, 20-30 minutes of sustained activity or two 10 minute episodes of sustained activity every day.  -resistance training at least 2 days per week if approved by your doctor -balance exercises 3+ days per week:   Stand somewhere where you have something sturdy to hold onto if you lose balance.    1) lift up on toes, start with 5x per day and work up to 20x   2) stand and lift on leg straight out to the side so that foot is a few inches of the floor, start with 5x each side and work up to 20x each side   3) stand on one foot, start with 5 seconds each side and work up to 20 seconds on each side  If you need ideas or help with getting more active:  -Silver sneakers https://tools.silversneakers.com  -Walk with a Doc: http://www.duncan-williams.com/  -try to include resistance (weight lifting/strength building) and balance exercises twice per week: or the following link for ideas: http://castillo-powell.com/  BuyDucts.dk  STRESS MANAGEMENT: -  can try meditating, or just sitting quietly with deep breathing while intentionally relaxing all parts of your body for 5 minutes daily -if you need further help with stress, anxiety or depression please follow up with your primary doctor or contact the wonderful folks at [facility]: [phone]  SOCIAL CONNECTIONS: -options in [place]  if you wish to engage in more social and exercise related activities:  -Silver sneakers https://tools.silversneakers.com  -Walk with a Doc: http://www.duncan-williams.com/  -Check out the [facility] Active Adults 50+ section on the [place] of [employer] (hiking clubs, book clubs, cards and games, chess, exercise classes, aquatic classes and much more) - see the website for details: https://www.[place].gov/departments/parks-recreation/active-adults50  -YouTube has lots of exercise videos for different ages and abilities as well  -[facility] (a variety of indoor and outdoor inperson activities for adults). [phone]. [address]  -Virtual Online Classes (a variety of topics): see seniorplanet.org or call [phone]  -consider volunteering at a school, hospice center, church, senior center or elsewhere    ADVANCED HEALTHCARE DIRECTIVES:  Everyone should have advanced health care directives in place. This is so that you get the care you want, should you ever be in a situation where you are unable to make your own medical decisions.   From the [employer] Advanced Directive Website: "Advance Health Care Directives are legal documents in which you give written instructions about your health care if, in the future, you cannot speak for yourself.   A health care power of attorney allows you to name a person you trust to make your health care decisions if you cannot make them yourself. A declaration of a desire for a natural death (or living will) is document, which states that you desire not to have your life prolonged by extraordinary measures if you have a terminal or incurable illness or if you are in a vegetative state. An advance instruction for mental health treatment makes a declaration of instructions, information and preferences regarding your mental health treatment. It also states that you are aware that the advance instruction authorizes a mental health  treatment provider to act according to your wishes. It may also outline your consent or refusal of mental health treatment. A declaration of an anatomical gift allows anyone over the age of 67 to make a gift by will, organ donor card or other document."   Please see the following website or an elder law attorney for forms, FAQs and for completion of advanced directives: [place] TEFL teacher Health Care Directives Advance Health Care Directives (http://guzman.com/)  Or copy and paste the following to your web browser: PoshChat.fi         Terressa Koyanagi, DO

## 2022-11-03 ENCOUNTER — Other Ambulatory Visit: Payer: Self-pay

## 2022-11-03 ENCOUNTER — Inpatient Hospital Stay: Payer: 59 | Attending: Nurse Practitioner

## 2022-11-03 DIAGNOSIS — E538 Deficiency of other specified B group vitamins: Secondary | ICD-10-CM | POA: Diagnosis present

## 2022-11-03 DIAGNOSIS — I700 Atherosclerosis of aorta: Secondary | ICD-10-CM

## 2022-11-03 DIAGNOSIS — D519 Vitamin B12 deficiency anemia, unspecified: Secondary | ICD-10-CM

## 2022-11-03 MED ORDER — CYANOCOBALAMIN 1000 MCG/ML IJ SOLN
1000.0000 ug | Freq: Once | INTRAMUSCULAR | Status: AC
  Administered 2022-11-03: 1000 ug via INTRAMUSCULAR
  Filled 2022-11-03: qty 1

## 2022-11-18 ENCOUNTER — Encounter: Payer: Self-pay | Admitting: Dietician

## 2022-11-18 ENCOUNTER — Encounter: Payer: 59 | Attending: Family Medicine | Admitting: Dietician

## 2022-11-18 VITALS — Wt 319.0 lb

## 2022-11-18 DIAGNOSIS — Z713 Dietary counseling and surveillance: Secondary | ICD-10-CM | POA: Insufficient documentation

## 2022-11-18 DIAGNOSIS — E119 Type 2 diabetes mellitus without complications: Secondary | ICD-10-CM

## 2022-11-18 DIAGNOSIS — E1169 Type 2 diabetes mellitus with other specified complication: Secondary | ICD-10-CM | POA: Insufficient documentation

## 2022-11-18 NOTE — Patient Instructions (Addendum)
Goal: try to use 1 spoon of sugar in your coffee instead of 2.   Goal: aim to drink less juice. Try to cut half and half with water, or try to have less in general.   Goal: start exercising 3 days per week for 15-30 minutes, we may continue to work up. Try the elliptical or stationary bike.   At meals, aim to include 1/2 plate non-starchy vegetables, 1/4 plate protein, and 1/4 plate complex carbs.

## 2022-11-18 NOTE — Progress Notes (Signed)
Diabetes Self-Management Education  Visit Type: First/Initial  Appt. Start Time: 1400 Appt. End Time: 1500  11/18/2022  [name], identified by name and date of birth, is a 67 y.o. male with a diagnosis of Diabetes: Type 2.   ASSESSMENT  History includes: type 2 diabetes, arthritis, cancer, HTN Labs noted: 09/01/22 A1c 6.9% Medications include: reviewed Supplements: goes to cancer center once a month for vitamin B12 injection.   Pt states he had surgery for colon cancer a year ago but he is cancer free now.   Pt states he started eating more sweets a few years ago but cut back within the last few weeks. He states he was going to the fresh market every day to get carrot cake. He states he tried no sugar in his coffee for a while but he felt like he was withdrawing.   Pt states before covid he walked 6 miles a day. He states he had right knee replacement and feels like he needs left knee to get checked out because it has been hurting. Pt not currently exercising but wants to get a [employer] membership to use the bike.   Weight (!) 319 lb (144.7 kg). Body mass index is 43.87 kg/m.   Diabetes Self-Management Education - 11/18/22 1355       Visit Information   Visit Type First/Initial      Initial Visit   Diabetes Type Type 2    Date Diagnosed 09/01/22    Are you currently following a meal plan? No    Are you taking your medications as prescribed? Not on Medications      Health Coping   How would you rate your overall health? Good      Psychosocial Assessment   Patient Belief/Attitude about Diabetes Motivated to manage diabetes    What is the hardest part about your diabetes right now, causing you the most concern, or is the most worrisome to you about your diabetes?   Making healty food and beverage choices    Self-care barriers None    Self-management support Doctor's office    Other persons present Patient    Patient Concerns Nutrition/Meal planning    Special Needs  None    Learning Readiness Ready    How often do you need to have someone help you when you read instructions, pamphlets, or other written materials from your doctor or pharmacy? 1 - Never    What is the last grade level you completed in school? 15      Pre-Education Assessment   Patient understands the diabetes disease and treatment process. Needs Instruction    Patient understands incorporating nutritional management into lifestyle. Needs Instruction    Patient undertands incorporating physical activity into lifestyle. Needs Instruction    Patient understands using medications safely. Needs Instruction    Patient understands monitoring blood glucose, interpreting and using results Needs Instruction    Patient understands prevention, detection, and treatment of acute complications. Needs Instruction    Patient understands prevention, detection, and treatment of chronic complications. Needs Instruction    Patient understands how to develop strategies to address psychosocial issues. Needs Instruction    Patient understands how to develop strategies to promote health/change behavior. Needs Instruction      Complications   Last HgB A1C per patient/outside source 6.9 %    How often do you check your blood sugar? Not recommended by provider    Have you had a dilated eye exam in the past 12 months?  No    Have you had a dental exam in the past 12 months? No    Are you checking your feet? Yes    How many days per week are you checking your feet? 2      Dietary Intake   Breakfast bowl of cereal with banana OR pork chop and sweet potato    Snack (morning) none    Lunch spinach salad with veggies and sometimes chicken    Snack (afternoon) none    Dinner chicken or pork with baked potato    Snack (evening) none    Beverage(s) 2 c coffee with 2 tsp sugar, "big glass of juice", 32-48 oz water      Activity / Exercise   Activity / Exercise Type ADL's    How many days per week do you exercise? 1     How many minutes per day do you exercise? 15    Total minutes per week of exercise 15      Patient Education   Previous Diabetes Education No    Disease Pathophysiology Definition of diabetes, type 1 and 2, and the diagnosis of diabetes;Explored patient's options for treatment of their diabetes;Factors that contribute to the development of diabetes    Healthy Eating Role of diet in the treatment of diabetes and the relationship between the three main macronutrients and blood glucose level;Plate Method;Meal timing in regards to the patients' current diabetes medication.;Information on hints to eating out and maintain blood glucose control.;Meal options for control of blood glucose level and chronic complications.    Being Active Role of exercise on diabetes management, blood pressure control and cardiac health.    Monitoring Daily foot exams;Yearly dilated eye exam    Acute complications Taught prevention, symptoms, and  treatment of hypoglycemia - the 15 rule.;Discussed and identified patients' prevention, symptoms, and treatment of hyperglycemia.    Chronic complications Relationship between chronic complications and blood glucose control;Identified and discussed with patient  current chronic complications    Diabetes Stress and Support Identified and addressed patients feelings and concerns about diabetes;Role of stress on diabetes;Worked with patient to identify barriers to care and solutions    Lifestyle and Health Coping Lifestyle issues that need to be addressed for better diabetes care      Individualized Goals (developed by patient)   Nutrition General guidelines for healthy choices and portions discussed    Physical Activity Exercise 3-5 times per week;30 minutes per day    Medications take my medication as prescribed    Monitoring  Test my blood glucose as discussed    Problem Solving Eating Pattern    Reducing Risk examine blood glucose patterns;do foot checks daily;treat  hypoglycemia with 15 grams of carbs if blood glucose less than 70mg /dL    Health Coping Ask for help with psychological, social, or emotional issues      Post-Education Assessment   Patient understands the diabetes disease and treatment process. Comprehends key points    Patient understands incorporating nutritional management into lifestyle. Comprehends key points    Patient undertands incorporating physical activity into lifestyle. Comprehends key points    Patient understands using medications safely. Comphrehends key points    Patient understands monitoring blood glucose, interpreting and using results Comprehends key points    Patient understands prevention, detection, and treatment of acute complications. Comprehends key points    Patient understands prevention, detection, and treatment of chronic complications. Comprehends key points    Patient understands how to develop strategies to address  psychosocial issues. Comprehends key points    Patient understands how to develop strategies to promote health/change behavior. Comprehends key points      Outcomes   Expected Outcomes Demonstrated interest in learning. Expect positive outcomes    Future DMSE 3-4 months    Program Status Not Completed             Individualized Plan for Diabetes Self-Management Training:   Learning Objective:  Patient will have a greater understanding of diabetes self-management. Patient education plan is to attend individual and/or group sessions per assessed needs and concerns.   Plan:   Patient Instructions  Goal: try to use 1 spoon of sugar in your coffee instead of 2.   Goal: aim to drink less juice. Try to cut half and half with water, or try to have less in general.   Goal: start exercising 3 days per week for 15-30 minutes, we may continue to work up. Try the elliptical or stationary bike.   At meals, aim to include 1/2 plate non-starchy vegetables, 1/4 plate protein, and 1/4 plate complex  carbs.    Expected Outcomes:  Demonstrated interest in learning. Expect positive outcomes  Education material provided: ADA - How to Thrive: A Guide for Your Journey with Diabetes and My Plate  If problems or questions, patient to contact team via:  Phone  Future DSME appointment: 3-4 months

## 2022-12-29 ENCOUNTER — Inpatient Hospital Stay: Payer: 59 | Attending: Nurse Practitioner

## 2022-12-29 DIAGNOSIS — D519 Vitamin B12 deficiency anemia, unspecified: Secondary | ICD-10-CM | POA: Diagnosis present

## 2022-12-29 DIAGNOSIS — I700 Atherosclerosis of aorta: Secondary | ICD-10-CM

## 2022-12-29 MED ORDER — CYANOCOBALAMIN 1000 MCG/ML IJ SOLN
1000.0000 ug | Freq: Once | INTRAMUSCULAR | Status: AC
  Administered 2022-12-29: 1000 ug via INTRAMUSCULAR
  Filled 2022-12-29: qty 1

## 2023-02-02 ENCOUNTER — Other Ambulatory Visit: Payer: Self-pay

## 2023-02-02 ENCOUNTER — Inpatient Hospital Stay: Payer: 59 | Attending: Nurse Practitioner

## 2023-02-02 VITALS — BP 154/96 | HR 90 | Temp 98.5°F | Resp 18

## 2023-02-02 DIAGNOSIS — D519 Vitamin B12 deficiency anemia, unspecified: Secondary | ICD-10-CM | POA: Diagnosis present

## 2023-02-02 DIAGNOSIS — I700 Atherosclerosis of aorta: Secondary | ICD-10-CM

## 2023-02-02 MED ORDER — CYANOCOBALAMIN 1000 MCG/ML IJ SOLN
1000.0000 ug | Freq: Once | INTRAMUSCULAR | Status: AC
  Administered 2023-02-02: 1000 ug via INTRAMUSCULAR
  Filled 2023-02-02: qty 1

## 2023-02-24 ENCOUNTER — Ambulatory Visit: Payer: 59 | Admitting: Dietician

## 2023-03-02 ENCOUNTER — Other Ambulatory Visit: Payer: Self-pay

## 2023-03-02 ENCOUNTER — Inpatient Hospital Stay: Payer: 59 | Attending: Nurse Practitioner

## 2023-03-02 VITALS — BP 146/95 | HR 98 | Temp 99.2°F | Resp 18

## 2023-03-02 DIAGNOSIS — D519 Vitamin B12 deficiency anemia, unspecified: Secondary | ICD-10-CM | POA: Diagnosis present

## 2023-03-02 DIAGNOSIS — I700 Atherosclerosis of aorta: Secondary | ICD-10-CM

## 2023-03-02 MED ORDER — CYANOCOBALAMIN 1000 MCG/ML IJ SOLN
1000.0000 ug | Freq: Once | INTRAMUSCULAR | Status: AC
  Administered 2023-03-02: 1000 ug via INTRAMUSCULAR
  Filled 2023-03-02: qty 1

## 2023-03-07 ENCOUNTER — Other Ambulatory Visit: Payer: Self-pay | Admitting: Family Medicine

## 2023-03-07 DIAGNOSIS — I10 Essential (primary) hypertension: Secondary | ICD-10-CM

## 2023-03-08 ENCOUNTER — Telehealth: Payer: Self-pay

## 2023-03-08 DIAGNOSIS — I10 Essential (primary) hypertension: Secondary | ICD-10-CM

## 2023-03-08 MED ORDER — HYDROCHLOROTHIAZIDE 25 MG PO TABS
25.0000 mg | ORAL_TABLET | Freq: Every day | ORAL | 1 refills | Status: DC
Start: 2023-03-08 — End: 2023-04-15

## 2023-03-08 NOTE — Telephone Encounter (Signed)
Copied from CRM [id number]. Topic: Clinical - Medication Refill >> Mar 08, 2023 10:17 AM Hector Shade B wrote: Most Recent Primary Care Visit:  Provider: [name], [name]  Department: [facility]  Visit Type: NEW PATIENT  Date: 09/01/2022  Medication:  hydrochlorothiazide (HYDRODIURIL) 25 MG tablet   Has the patient contacted their pharmacy? Yes (Agent: If no, request that the patient contact the pharmacy for the refill. If patient does not wish to contact the pharmacy document the reason why and proceed with request.) (Agent: If yes, when and what did the pharmacy advise?)Call the doctor  Is this the correct pharmacy for this prescription? Yes If no, delete pharmacy and type the correct one.  This is the patient's preferred pharmacy:  [id number] - [place], [place] - [address] AT [facility] [address] [place] [address] Phone: [phone] Fax: [phone]   Has the prescription been filled recently? Yes  Is the patient out of the medication? Yes  Has the patient been seen for an appointment in the last year OR does the patient have an upcoming appointment? Yes  Can we respond through MyChart? Yes  Agent: Please be advised that Rx refills may take up to 3 business days. We ask that you follow-up with your pharmacy.

## 2023-03-15 ENCOUNTER — Other Ambulatory Visit: Payer: Self-pay

## 2023-03-15 DIAGNOSIS — C187 Malignant neoplasm of sigmoid colon: Secondary | ICD-10-CM

## 2023-03-17 ENCOUNTER — Encounter: Payer: 59 | Attending: Family Medicine | Admitting: Dietician

## 2023-03-17 ENCOUNTER — Encounter: Payer: Self-pay | Admitting: Dietician

## 2023-03-17 ENCOUNTER — Other Ambulatory Visit: Payer: Self-pay | Admitting: Family Medicine

## 2023-03-17 VITALS — Wt 313.0 lb

## 2023-03-17 DIAGNOSIS — E119 Type 2 diabetes mellitus without complications: Secondary | ICD-10-CM | POA: Diagnosis present

## 2023-03-17 DIAGNOSIS — I10 Essential (primary) hypertension: Secondary | ICD-10-CM

## 2023-03-17 NOTE — Patient Instructions (Signed)
 New Goals (established by patient):  Goal: After your big morning glass of water, refill it and drink on it the rest of the day.   Goal: Walk down to dumpster and back more often. Start [employer] membership at the beginning of March

## 2023-03-17 NOTE — Progress Notes (Signed)
 Diabetes Self-Management Education  Visit Type: Follow-up  Appt. Start Time: 1530 Appt. End Time: 1610  03/17/2023  Eric Macdonald, identified by name and date of birth, is a 68 y.o. male with a diagnosis of Diabetes: type 2 .   ASSESSMENT  History includes: type 2 diabetes, arthritis, cancer, HTN Labs noted: 09/01/22 A1c 6.9% Medications include: reviewed Supplements: goes to cancer center once a month for vitamin B12 injection.   Wt Readings from Last 3 Encounters:  03/17/23 (!) 313 lb (142 kg)  11/18/22 (!) 319 lb (144.7 kg)  10/05/22 (!) 318 lb (144.2 kg)   Pt states he lost 9 lb in one week following previous visit with this RD. He states it was not sustainable because he then gained some back. Pt states he cut out the sugar in his coffee, cut his portions in half, and reduced overall sugar intake when this happened. Pt states he since then has added sugar back to his coffee but continues to work on balancing his plate and reducing intake of sweets.  Pt states he did not exercise. Pt states he walks up the steps where he lives sometimes twice per day. Pt states when he used to go walking he really enjoyed it but isn't able to do much with his knee issues. Pt states he wants to start walking again.   New Goals (established by patient):  Goal: After your big morning glass of water, refill it and drink on it the rest of the day.   Goal: Walk down to dumpster and back more often. Start [employer] membership at the beginning of March  Assessment of Previous Goals:   Goal: try to use 1 spoon of sugar in your coffee instead of 2. - goal met, continue.   Goal: aim to drink less juice. Try to cut half and half with water, or try to have less in general. - goal met, pt states he hasn't been drinking juice.    Goal: start exercising 3 days per week for 15-30 minutes, we may continue to work up. Try the elliptical or stationary bike. - goal not met, set new exercise goal.    There were no vitals taken for this visit. There is no height or weight on file to calculate BMI.   Diabetes Self-Management Education - 03/17/23 1533       Visit Information   Visit Type Follow-up      Health Coping   How would you rate your overall health? Good      Psychosocial Assessment   Patient Belief/Attitude about Diabetes Motivated to manage diabetes    What is the hardest part about your diabetes right now, causing you the most concern, or is the most worrisome to you about your diabetes?   Making healty food and beverage choices    Self-care barriers None    Self-management support Doctor's office    Other persons present Patient    Patient Concerns Nutrition/Meal planning    Special Needs None    Preferred Learning Style No preference indicated    Learning Readiness Ready      Pre-Education Assessment   Patient understands the diabetes disease and treatment process. Needs Review    Patient understands incorporating nutritional management into lifestyle. Needs Review    Patient undertands incorporating physical activity into lifestyle. Needs Review    Patient understands using medications safely. Needs Review    Patient understands monitoring blood glucose, interpreting and using results Needs Review  Patient understands prevention, detection, and treatment of acute complications. Needs Review    Patient understands prevention, detection, and treatment of chronic complications. Needs Review    Patient understands how to develop strategies to address psychosocial issues. Needs Review    Patient understands how to develop strategies to promote health/change behavior. Needs Review      Complications   Last HgB A1C per patient/outside source 6.9 %      Dietary Intake   Breakfast leftovers from dinner (pork chop and baked potato)    Snack (morning) none    Lunch popcorn    Snack (afternoon) none    Dinner kale, spinach, watercress, and baked potato/sweet  potato/rice, and pork chop    Snack (evening) [employer]    Beverage(s) vanilla chai bolthouse drink, 16-24 oz water, coffee      Activity / Exercise   Activity / Exercise Type ADL's    How many days per week do you exercise? 0    How many minutes per day do you exercise? 0    Total minutes per week of exercise 0      Patient Education   Previous Diabetes Education Yes (please comment)    Disease Pathophysiology Explored patient's options for treatment of their diabetes    Healthy Eating Role of diet in the treatment of diabetes and the relationship between the three main macronutrients and blood glucose level;Plate Method;Meal options for control of blood glucose level and chronic complications.    Being Active Role of exercise on diabetes management, blood pressure control and cardiac health.;Helped patient identify appropriate exercises in relation to his/her diabetes, diabetes complications and other health issue.    Medications Reviewed patients medication for diabetes, action, purpose, timing of dose and side effects.    Chronic complications Relationship between chronic complications and blood glucose control;Identified and discussed with patient  current chronic complications    Diabetes Stress and Support Identified and addressed patients feelings and concerns about diabetes;Worked with patient to identify barriers to care and solutions    Lifestyle and Health Coping Lifestyle issues that need to be addressed for better diabetes care      Individualized Goals (developed by patient)   Nutrition General guidelines for healthy choices and portions discussed    Physical Activity Exercise 3-5 times per week;15 minutes per day    Medications take my medication as prescribed    Monitoring  Test my blood glucose as discussed    Problem Solving Eating Pattern    Reducing Risk examine blood glucose patterns;do foot checks daily;treat hypoglycemia with 15 grams of carbs if blood  glucose less than 70mg /dL    Health Coping Ask for help with psychological, social, or emotional issues      Patient Self-Evaluation of Goals - Patient rates self as meeting previously set goals (% of time)   Nutrition 50 - 75 % (half of the time)    Physical Activity 25 - 50% (sometimes)    Medications >75% (most of the time)    Monitoring 50 - 75 % (half of the time)    Problem Solving and behavior change strategies  50 - 75 % (half of the time)    Reducing Risk (treating acute and chronic complications) 50 - 75 % (half of the time)    Health Coping 50 - 75 % (half of the time)      Post-Education Assessment   Patient understands the diabetes disease and treatment process. Comprehends key points  Patient understands incorporating nutritional management into lifestyle. Comprehends key points    Patient undertands incorporating physical activity into lifestyle. Comprehends key points    Patient understands using medications safely. Comphrehends key points    Patient understands monitoring blood glucose, interpreting and using results Comprehends key points    Patient understands prevention, detection, and treatment of acute complications. Comprehends key points    Patient understands prevention, detection, and treatment of chronic complications. Comprehends key points    Patient understands how to develop strategies to address psychosocial issues. Comprehends key points    Patient understands how to develop strategies to promote health/change behavior. Comprehends key points      Outcomes   Expected Outcomes Demonstrated interest in learning. Expect positive outcomes    Future DMSE 3-4 months    Program Status Not Completed      Subsequent Visit   Since your last visit have you continued or begun to take your medications as prescribed? Yes    Since your last visit have you experienced any weight changes? Loss    Weight Loss (lbs) 6             Individualized Plan for Diabetes  Self-Management Training:   Learning Objective:  Patient will have a greater understanding of diabetes self-management. Patient education plan is to attend individual and/or group sessions per assessed needs and concerns.   Plan:   Patient Instructions  New Goals (established by patient):  Goal: After your big morning glass of water, refill it and drink on it the rest of the day.   Goal: Walk down to dumpster and back more often. Start [employer] membership at the beginning of March  Expected Outcomes:  Demonstrated interest in learning. Expect positive outcomes  Education material provided: no handouts provided on this follow up  If problems or questions, patient to contact team via:  Phone  Future DSME appointment: 3-4 months

## 2023-03-29 NOTE — Assessment & Plan Note (Signed)
-  Hgb 9.5 on 3/31 pre-op; MCV 69 with thrombocytosis. Likely secondary to tumor bleeding. Work up showed serum iron 34, TIBC 144, ferritin 125; he was found to have low folate 3.5 and low B12 103 -He is [employer]hovah's witness, does not accept blood products.  -B12 and folate WNL since 10/2020 -He began B12 injections in 2022, B12 level normalized.  He prefers to continue monthly B12 injection rather than take oral pill

## 2023-03-29 NOTE — Assessment & Plan Note (Signed)
 moderately differentiated G2, pT3pN0M0 stage IIA, MMR normal, MSI-stable -He presented with 2-6 months bloody diarrhea, fatigue, and 20 lbs weight loss. 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 Baseline CEA mildly elevated 6.1, staging work up negative for metastatic disease.  -S/p partial colectomy 06/20/20 by Dr. Debby, surgical path showed 12.7 cm carcinoma was removed, 0/27 LNs+, margins clear. There were no high risk features such as perforation, PNI, or LVI. -Given stage II colon cancer, adjuvant chemotherapy is not recommended. -The recurrence risk is moderate.   -ctDNA not detected (07/21/2020, 08/25/2020, 10/27/2020, 01/26/2021 and 05/25/2021, 02/2022, 08/2022) -Surveillance colonoscopy 11/2021 (Dr. Tamela at [facility]) was negative, 3-year recall -Surveillance CT 07/08/22 NED

## 2023-03-30 ENCOUNTER — Inpatient Hospital Stay: Payer: 59

## 2023-03-30 ENCOUNTER — Inpatient Hospital Stay: Payer: 59 | Attending: Nurse Practitioner

## 2023-03-30 ENCOUNTER — Encounter: Payer: Self-pay | Admitting: Hematology

## 2023-03-30 ENCOUNTER — Inpatient Hospital Stay (HOSPITAL_BASED_OUTPATIENT_CLINIC_OR_DEPARTMENT_OTHER): Payer: 59 | Admitting: Hematology

## 2023-03-30 VITALS — BP 145/94 | HR 92 | Temp 98.4°F | Resp 18 | Wt 314.6 lb

## 2023-03-30 DIAGNOSIS — D519 Vitamin B12 deficiency anemia, unspecified: Secondary | ICD-10-CM | POA: Insufficient documentation

## 2023-03-30 DIAGNOSIS — C187 Malignant neoplasm of sigmoid colon: Secondary | ICD-10-CM

## 2023-03-30 DIAGNOSIS — Z79899 Other long term (current) drug therapy: Secondary | ICD-10-CM | POA: Insufficient documentation

## 2023-03-30 DIAGNOSIS — I700 Atherosclerosis of aorta: Secondary | ICD-10-CM

## 2023-03-30 LAB — CMP (CANCER CENTER ONLY)
ALT: 17 U/L (ref 0–44)
AST: 16 U/L (ref 15–41)
Albumin: 4.1 g/dL (ref 3.5–5.0)
Alkaline Phosphatase: 46 U/L (ref 38–126)
Anion gap: 6 (ref 5–15)
BUN: 16 mg/dL (ref 8–23)
CO2: 33 mmol/L — ABNORMAL HIGH (ref 22–32)
Calcium: 9.4 mg/dL (ref 8.9–10.3)
Chloride: 98 mmol/L (ref 98–111)
Creatinine: 1.51 mg/dL — ABNORMAL HIGH (ref 0.61–1.24)
GFR, Estimated: 50 mL/min — ABNORMAL LOW (ref >60)
Glucose, Bld: 98 mg/dL (ref 70–99)
Potassium: 3.2 mmol/L — ABNORMAL LOW (ref 3.5–5.1)
Sodium: 137 mmol/L (ref 135–145)
Total Bilirubin: 0.5 mg/dL (ref 0.0–1.2)
Total Protein: 8.1 g/dL (ref 6.5–8.1)

## 2023-03-30 LAB — CBC WITH DIFFERENTIAL (CANCER CENTER ONLY)
Abs Immature Granulocytes: 0.02 10*3/uL (ref 0.00–0.07)
Basophils Absolute: 0.1 10*3/uL (ref 0.0–0.1)
Basophils Relative: 1 %
Eosinophils Absolute: 0.1 10*3/uL (ref 0.0–0.5)
Eosinophils Relative: 2 %
HCT: 46.1 % (ref 39.0–52.0)
Hemoglobin: 15.4 g/dL (ref 13.0–17.0)
Immature Granulocytes: 0 %
Lymphocytes Relative: 41 %
Lymphs Abs: 2.5 10*3/uL (ref 0.7–4.0)
MCH: 25.3 pg — ABNORMAL LOW (ref 26.0–34.0)
MCHC: 33.4 g/dL (ref 30.0–36.0)
MCV: 75.8 fL — ABNORMAL LOW (ref 80.0–100.0)
Monocytes Absolute: 0.7 10*3/uL (ref 0.1–1.0)
Monocytes Relative: 11 %
Neutro Abs: 2.8 10*3/uL (ref 1.7–7.7)
Neutrophils Relative %: 45 %
Platelet Count: 324 10*3/uL (ref 150–400)
RBC: 6.08 MIL/uL — ABNORMAL HIGH (ref 4.22–5.81)
RDW: 18 % — ABNORMAL HIGH (ref 11.5–15.5)
WBC Count: 6.2 10*3/uL (ref 4.0–10.5)
nRBC: 0 % (ref 0.0–0.2)

## 2023-03-30 MED ORDER — CYANOCOBALAMIN 1000 MCG/ML IJ SOLN
1000.0000 ug | Freq: Once | INTRAMUSCULAR | Status: AC
  Administered 2023-03-30: 1000 ug via INTRAMUSCULAR
  Filled 2023-03-30: qty 1

## 2023-03-30 NOTE — Progress Notes (Signed)
 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 Baseline CEA mildly elevated 6.1, staging work up negative for metastatic disease.  -S/p partial colectomy 06/20/20 by [name], surgical path showed 12.7 cm carcinoma was removed, 0/27 LNs+, margins clear. There were no high risk features such as perforation, PNI, or LVI. -Given stage II colon cancer, adjuvant chemotherapy is not recommended. -The recurrence risk is moderate.   -ctDNA not detected (07/21/2020, 08/25/2020, 10/27/2020, 01/26/2021 and 05/25/2021, 02/2022, 08/2022) -Surveillance colonoscopy 11/2021 ([name] at [facility]) was negative, 3-year recall -Surveillance CT 07/08/22 NED  B12 deficiency anemia -Hgb 9.5 on 3/31 pre-op; MCV 69 with thrombocytosis. Likely secondary to tumor bleeding. Work up showed serum iron 34, TIBC 144, ferritin 125; he was found to have low folate 3.5 and low B12 103 -He is Jehovah's witness, does not accept blood products.  -B12 and folate WNL since 10/2020 -He began B12 injections in 2022, B12 level normalized.  He prefers to continue monthly B12 injection rather than take oral pill    Assessment and Plan    Colon Cancer (Post-Treatment Follow-Up) Follow-up for colon cancer  diagnosed in 2022. No current symptoms of recurrence. Next CT scan scheduled for April 2025. Discussed the importance of continued monitoring. - Schedule CT scan in April 2025 - Continue follow-up visits every three months  Vitamin B12 Deficiency Receiving monthly B12 injections. Prefers clinic administration over self-administration. Discussed benefits of regular injections in managing deficiency and preventing symptoms such as fatigue and anemia. - Continue monthly B12 injections at the clinic  Knee Pain (Post-Knee Replacement) Reports knee pain, especially in cold weather, likely related to previous knee replacement with titanium implant. No current swelling or severe pain. Discussed potential need for insulated clothing. Prefers orthopedic specialist consultation. - Refer to orthopedic specialist for knee evaluation  General Health Maintenance Received flu shot but contracted flu. Encouraged regular exercise to address overweight status and improve overall health. - Encourage regular exercise to address overweight status  Plan -He is clinically doing well, no concern for recurrence. -Will proceed B12 and continue monthly - Schedule follow-up visit in three months - Schedule lab work and CT scan for next visit.         SUMMARY OF ONCOLOGIC HISTORY: Oncology History Overview Note   Cancer Staging  Cancer of sigmoid colon [facility]) Staging form: Colon and Rectum, AJCC 8th Edition - Pathologic stage from 06/20/2020: Stage IIA (pT3, pN0, cM0) - Signed by [name], [name], NP on 07/21/2020    Cancer of sigmoid colon (HCC)  05/15/2020 Procedure   Colonoscopy by [name]: obstructing mass found in the sigmoid colon located 35 cm from the anus.  Unable to advance past the mass.  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  No metastatic disease in the chest, abdomen, pelvis   06/12/2020 Tumor Marker   Baseline CEA 6.1   06/20/2020 Initial Diagnosis   Cancer of sigmoid colon (HCC)   06/20/2020 Cancer Staging   Staging form: Colon and Rectum, AJCC 8th Edition - Pathologic stage from 06/20/2020: Stage IIA (pT3, pN0, cM0) - Signed by [name], NP on 07/21/2020 Stage prefix: Initial diagnosis Total positive nodes: 0 Histologic grading system: 4 grade system Histologic grade (G): G2   06/20/2020 Definitive Surgery   Laparoscopic partial colectomy, small bowel resection by [name]   06/20/2020 Pathology Results   FINAL MICROSCOPIC DIAGNOSIS:   A. COLON, SIGMOID AND ATTACHED SMALL BOWEL, RESECTION:  -  Adenocarcinoma, moderately differentiated, 12.7 cm  -  No carcinoma identified in twenty-four lymph nodes (0/24)  -  Margins uninvolved by carcinoma (0.2 cm; mesenteric margin)  -  Sessile serrated polyp  -  See oncology table and comment below   B. COLON, ADDITIONAL PROXIMAL MARGIN, RESECTION:  -  No residual adenocarcinoma identified  -  No carcinoma identified in three lymph nodes (0/3)   MMR normal, MSI-stable   05/22/2021 Imaging   EXAM: CT CHEST, ABDOMEN, AND PELVIS WITH CONTRAST  IMPRESSION: 1. Prior partial sigmoidectomy with anastomotic sutures in the superior pelvis, without evidence of local recurrence/residual disease. 2. No convincing evidence of metastatic disease in the chest, abdomen or pelvis. 3. Prominent left common iliac lymph node measures 8 mm, is nonspecific. Attention on follow-up imaging suggested. 4. Generalized decrease in hepatic parenchymal attenuation with focal geographic areas of relative hyperattenuation,  almost certainly reflects diffuse hepatic steatosis with focal fatty sparing. This could be more definitively characterized with hepatic protocol abdominal MRI with and without contrast if clinically indicated. 5.  Aortic Atherosclerosis (ICD10-I70.0).      Discussed the use of AI scribe software for clinical note transcription with the patient, who gave verbal consent to proceed.  History of Present Illness   The patient, a 68 year old gentleman with a history of colon cancer diagnosed in 2022, presents for routine follow-up. He reports no current stomach issues and his bowel movements are regular. He acknowledges the need for more exercise due to being overweight. He also reports some knee discomfort, one of which has been replaced with a titanium implant. He mentions having a B12 injection due to being anemic. He has a history of passing out in public places, which he attributes to being anemic and having a large cancerous mass that was consuming his nutrients.         All other systems were reviewed with the patient and are negative.  MEDICAL HISTORY:  Past Medical History:  Diagnosis Date   Arthritis    Cancer (HCC)    colon   Chronic low back pain    Is supposed to have surgery soon at [facility]   Diabetes mellitus without complication (HCC)    Dx'ed 09/01/22 with HgA1C 6.9   Hypertension    Knee pain     SURGICAL HISTORY: Past Surgical History:  Procedure Laterality Date   JOINT REPLACEMENT     knee 05/2011   KNEE SURGERY  08   rt arthroscopy   LAPAROSCOPIC PARTIAL COLECTOMY  N/A 06/20/2020   Procedure: LAPAROSCOPIC PARTIAL COLECTOMY;  Surgeon: [name], MD;  Location: [facility];  Service: General;  Laterality: N/A;   TOTAL KNEE ARTHROPLASTY  05/05/2011   Procedure: TOTAL KNEE ARTHROPLASTY;  Surgeon: Ferd Householder, MD;  Location: [facility] OR;  Service: Orthopedics;  Laterality: Right;  DR MURPHY WANTS 90 MINUTES FOR THIS CASE    I have reviewed the social history and family  history with the patient and they are unchanged from previous note.  ALLERGIES:  is allergic to other.  MEDICATIONS:  Current Outpatient Medications  Medication Sig Dispense Refill   amLODipine  (NORVASC ) 10 MG tablet TAKE 1 TABLET(10 MG) BY MOUTH DAILY 90 tablet 1   Blood Glucose Monitoring Suppl (ACCU-CHEK AVIVA PLUS) w/Device KIT As directed. 1 kit 0   hydrochlorothiazide  (HYDRODIURIL ) 25 MG tablet Take 1 tablet (25 mg total) by mouth daily. 90 tablet 1   potassium chloride  SA (KLOR-CON  M) 20 MEQ tablet Take 1 tablet (20 mEq total) by mouth 2 (two) times daily. 30 tablet 1   No current facility-administered medications for this visit.    PHYSICAL EXAMINATION: ECOG PERFORMANCE STATUS: 0 - Asymptomatic  Vitals:   03/30/23 1322  BP: (!) 145/94  Pulse: 92  Resp: 18  Temp: 98.4 F (36.9 C)  SpO2: 99%   Wt Readings from Last 3 Encounters:  03/30/23 (!) 314 lb 9.6 oz (142.7 kg)  03/17/23 (!) 313 lb (142 kg)  11/18/22 (!) 319 lb (144.7 kg)     GENERAL:alert, no distress and comfortable SKIN: skin color, texture, turgor are normal, no rashes or significant lesions EYES: normal, Conjunctiva are pink and non-injected, sclera clear NECK: supple, thyroid normal size, non-tender, without nodularity LYMPH:  no palpable lymphadenopathy in the cervical, axillary  LUNGS: clear to auscultation and percussion with normal breathing effort HEART: regular rate & rhythm and no murmurs and no lower extremity edema ABDOMEN:abdomen soft, non-tender and normal bowel sounds Musculoskeletal:no cyanosis of digits and no clubbing  NEURO: alert & oriented x 3 with fluent speech, no focal motor/sensory deficits    LABORATORY DATA:  I have reviewed the data as listed    Latest Ref Rng & Units 03/30/2023    1:02 PM 09/29/2022   10:02 AM 08/30/2022   12:57 PM  CBC  WBC 4.0 - 10.5 K/uL 6.2  6.7  6.5   Hemoglobin 13.0 - 17.0 g/dL 16.1  09.6  04.5   Hematocrit 39.0 - 52.0 % 46.1  44.1  45.5    Platelets 150 - 400 K/uL 324  306  337         Latest Ref Rng & Units 03/30/2023    1:02 PM 09/29/2022   10:02 AM 08/30/2022   12:57 PM  CMP  Glucose 70 - 99 mg/dL 98  409  811   BUN 8 - 23 mg/dL 16  16  11    Creatinine 0.61 - 1.24 mg/dL 9.14  7.82  9.56   Sodium 135 - 145 mmol/L 137  137  138   Potassium 3.5 - 5.1 mmol/L 3.2  3.1  3.2   Chloride 98 - 111 mmol/L 98  98  99   CO2 22 - 32 mmol/L 33  31  31   Calcium  8.9 - 10.3 mg/dL 9.4  9.2  9.6   Total Protein 6.5 - 8.1 g/dL 8.1  7.7  7.3   Total Bilirubin 0.0 - 1.2 mg/dL 0.5  0.5  0.5   Alkaline Phos 38 - 126  U/L 46  50  51   AST 15 - 41 U/L 16  16  17    ALT 0 - 44 U/L 17  19  22        RADIOGRAPHIC STUDIES: I have personally reviewed the radiological images as listed and agreed with the findings in the report. No results found.    Orders Placed This Encounter  Procedures   CT CHEST ABDOMEN PELVIS W CONTRAST    HOLD IV CONTRAST IF EGFR<45    Standing Status:   Future    Expected Date:   06/21/2023    Expiration Date:   03/29/2024    If indicated for the ordered procedure, I authorize the administration of contrast media per Radiology protocol:   Yes    Does the patient have a contrast media/X-ray dye allergy?:   No    Preferred imaging location?:   [facility]    Release to patient:   Immediate    If indicated for the ordered procedure, I authorize the administration of oral contrast media per Radiology protocol:   Yes   Vitamin B12    Standing Status:   Standing    Number of Occurrences:   10    Expiration Date:   03/29/2024   All questions were answered. The patient knows to call the clinic with any problems, questions or concerns. No barriers to learning was detected. The total time spent in the appointment was 25 minutes.     Sonja Tooele, MD 03/30/2023

## 2023-04-01 ENCOUNTER — Telehealth: Payer: Self-pay

## 2023-04-01 NOTE — Telephone Encounter (Signed)
-----   Message from Pollyann Samples sent at 03/31/2023  6:10 PM EST ----- Not sure if Dr. Mosetta Putt had CMP results during the visit and discussed K and Scr. Please see that he is taking KCL on his med list and f/up with PCP.  Thanks [name] NP

## 2023-04-01 NOTE — Telephone Encounter (Signed)
T/C to pt to advise of lab results and recommendations.  He has been taking potassium but it was OTC and not his prescription.  He will call the pharmacy to get a refill and call his PCP to f/up

## 2023-04-02 ENCOUNTER — Encounter: Payer: Self-pay | Admitting: Nurse Practitioner

## 2023-04-06 ENCOUNTER — Encounter: Payer: Self-pay | Admitting: Hematology

## 2023-04-07 LAB — GUARDANT REVEAL

## 2023-04-12 ENCOUNTER — Ambulatory Visit: Payer: Self-pay | Admitting: Family Medicine

## 2023-04-12 NOTE — Telephone Encounter (Signed)
Copied from CRM [id number]. Topic: Clinical - Red Word Triage >> Apr 12, 2023 11:01 AM [name] wrote: Red Word that prompted transfer to Nurse Triage: vertigo,dizziness  Chief Complaint: dizziness, when he lays in bed at night. Symptoms: dizzy when laying down Frequency: constant Pertinent Negatives: Patient denies fever, weakness, pain Disposition: [] ED /[] Urgent Care (no appt availability in office) / [x] Appointment(In office/virtual)/ []  [place]/ [] Home Care/ [] Refused Recommended Disposition /[] [place] [facility]/ []  Follow-up with PCP Additional Notes: hx CA.  States only get dizzy when he lays down.  Apt made for tomorrow am with pcp.  States he wants to know what is causing his dizzy spells.  Care advice given, denies questions, pcp office updated.  Instructed to go to er if becomes worse.   Reason for Disposition  [1] MODERATE dizziness (e.g., vertigo; feels very unsteady, interferes with normal activities) AND [2] has NOT been evaluated by doctor (or NP/PA) for this  Answer Assessment - Initial Assessment Questions 1. DESCRIPTION: "Describe your dizziness."     When laying in bed at night, states he is dizzy. 2. VERTIGO: "Do you feel like either you or the room is spinning or tilting?"      tilting 3. LIGHTHEADED: "Do you feel lightheaded?" (e.g., somewhat faint, woozy, weak upon standing)     Feels like he will past out. 4. SEVERITY: "How bad is it?"  "Can you walk?"   - MILD: Feels slightly dizzy and unsteady, but is walking normally.   - MODERATE: Feels unsteady when walking, but not falling; interferes with normal activities (e.g., school, work).   - SEVERE: Unable to walk without falling, or requires assistance to walk without falling.     Feels like he will past out. 5. ONSET:  "When did the dizziness begin?"     Past couple of weeks 6. AGGRAVATING FACTORS: "Does anything make it worse?" (e.g., standing, change in head position)     Laying down 7. CAUSE:  "What do you think is causing the dizziness?"     Hx vertigo 8. RECURRENT SYMPTOM: "Have you had dizziness before?" If Yes, ask: "When was the last time?" "What happened that time?"     yes 9. OTHER SYMPTOMS: "Do you have any other symptoms?" (e.g., headache, weakness, numbness, vomiting, earache)     Denies.  Protocols used: Dizziness - Vertigo-A-AH

## 2023-04-12 NOTE — Telephone Encounter (Signed)
Noted

## 2023-04-13 ENCOUNTER — Encounter: Payer: Self-pay | Admitting: Family Medicine

## 2023-04-13 ENCOUNTER — Encounter: Payer: Self-pay | Admitting: Nurse Practitioner

## 2023-04-13 ENCOUNTER — Ambulatory Visit (INDEPENDENT_AMBULATORY_CARE_PROVIDER_SITE_OTHER): Payer: 59 | Admitting: Family Medicine

## 2023-04-13 VITALS — BP 130/80 | HR 77 | Temp 99.1°F | Resp 16 | Ht 71.5 in | Wt 315.0 lb

## 2023-04-13 DIAGNOSIS — Z1159 Encounter for screening for other viral diseases: Secondary | ICD-10-CM

## 2023-04-13 DIAGNOSIS — G8929 Other chronic pain: Secondary | ICD-10-CM | POA: Insufficient documentation

## 2023-04-13 DIAGNOSIS — I10 Essential (primary) hypertension: Secondary | ICD-10-CM

## 2023-04-13 DIAGNOSIS — E876 Hypokalemia: Secondary | ICD-10-CM | POA: Diagnosis not present

## 2023-04-13 DIAGNOSIS — E1169 Type 2 diabetes mellitus with other specified complication: Secondary | ICD-10-CM | POA: Diagnosis not present

## 2023-04-13 DIAGNOSIS — I700 Atherosclerosis of aorta: Secondary | ICD-10-CM

## 2023-04-13 DIAGNOSIS — R42 Dizziness and giddiness: Secondary | ICD-10-CM | POA: Diagnosis not present

## 2023-04-13 DIAGNOSIS — M25562 Pain in left knee: Secondary | ICD-10-CM

## 2023-04-13 DIAGNOSIS — Z6841 Body Mass Index (BMI) 40.0 and over, adult: Secondary | ICD-10-CM

## 2023-04-13 LAB — LIPID PANEL
Cholesterol: 217 mg/dL — ABNORMAL HIGH (ref 0–200)
HDL: 46.3 mg/dL (ref >39.00)
LDL Cholesterol: 155 mg/dL — ABNORMAL HIGH (ref 0–99)
NonHDL: 170.79
Total CHOL/HDL Ratio: 5
Triglycerides: 79 mg/dL (ref 0.0–149.0)
VLDL: 15.8 mg/dL (ref 0.0–40.0)

## 2023-04-13 LAB — BASIC METABOLIC PANEL
BUN: 14 mg/dL (ref 6–23)
CO2: 32 meq/L (ref 19–32)
Calcium: 9.4 mg/dL (ref 8.4–10.5)
Chloride: 99 meq/L (ref 96–112)
Creatinine, Ser: 1.21 mg/dL (ref 0.40–1.50)
GFR: 61.67 mL/min (ref >60.00)
Glucose, Bld: 106 mg/dL — ABNORMAL HIGH (ref 70–99)
Potassium: 3.3 meq/L — ABNORMAL LOW (ref 3.5–5.1)
Sodium: 138 meq/L (ref 135–145)

## 2023-04-13 LAB — HEMOGLOBIN A1C: Hgb A1c MFr Bld: 7.2 % — ABNORMAL HIGH (ref 4.6–6.5)

## 2023-04-13 LAB — MICROALBUMIN / CREATININE URINE RATIO
Creatinine,U: 122 mg/dL
Microalb Creat Ratio: 8.6 mg/g (ref 0.0–30.0)
Microalb, Ur: 10.5 mg/dL — ABNORMAL HIGH (ref 0.0–1.9)

## 2023-04-13 LAB — MAGNESIUM: Magnesium: 2.2 mg/dL (ref 1.5–2.5)

## 2023-04-13 MED ORDER — BLOOD GLUCOSE TEST STRIPS 333 VI STRP: ORAL_STRIP | 2 refills | Status: AC

## 2023-04-13 MED ORDER — MECLIZINE HCL 25 MG PO TABS: 12.5000 mg | ORAL_TABLET | Freq: Every evening | ORAL | 0 refills | Status: AC | PRN

## 2023-04-13 NOTE — Assessment & Plan Note (Signed)
HgA1C was 6.9 in 08/2022. Currently on non pharmacologic treatment. Regular exercise and healthy diet with avoidance of added sugar food intake is an important part of treatment and recommended. Annual eye exam, periodic dental and foot care recommended. F/U in 5-6 months.

## 2023-04-13 NOTE — Assessment & Plan Note (Signed)
He is not on statin med.

## 2023-04-13 NOTE — Progress Notes (Signed)
ACUTE VISIT Chief Complaint  Patient presents with   Dizziness    Ongoing since the weather started to get colder    HPI: [name] is a 68 y.o. male with a PMHx significant for HTN, colon cancer, DM II, insomnia, chronic low back pain, and B12 deficiency anemia, who is here today complaining of dizziness.   Dizziness:  Patient complains of frequent dizziness over the last 4 weeks.  He describes it as a movement sensation that often happens while he is lying in bed, and lasts for a few seconds.  He believes the dizziness is worsened by colder temperature, and has noticed some improvement with the warmer weather this week.  Has not been taking any OTCs or trying any home remedies.  Pertinent negatives include visual changes, nausea, chest pain, palpitations, clamminess, tinnitus, or hearing changes.   Diabetes Mellitus II: Dx'ed in 08/2022 with HgA1C 6.9. - Checking BG at home: He has not been checking his blood sugar because he doesn't have strips for his glucometer.  - Medications: not currently on pharmacologic treatments. Since last visit he met with nutritionist and has made some dietary changes. - He says he has changed his diet and decreased his sugar intake.  - Exercise: Patient admits he needs to exercise more frequently, but has had some difficulty due to his knee pain.  - eye exam: ~10 years ago - foot exam: performed today - Negative for symptoms of hypoglycemia, polyuria, polydipsia, numbness extremities, foot ulcers/trauma  Lab Results  Component Value Date   HGBA1C 6.9 (A) 09/01/2022   Lab Results  Component Value Date   MICROALBUR 4.4 (H) 09/01/2022   Aortic atherosclerosis seen on chest CT in 05/2021. He is not on statin medication.  Hypertension:  Medications: Currently on amlodipine 10 mg daily and hydrochlorothiazide 25 mg daily.  BP readings at home: He has not been checking his BP regularly at home.  Side effects: none Negative for unusual  or severe headache, visual changes, exertional chest pain, dyspnea,  focal weakness, or edema.  Lab Results  Component Value Date   CREATININE 1.51 (H) 03/30/2023   BUN 16 03/30/2023   NA 137 03/30/2023   K 3.2 (L) 03/30/2023   CL 98 03/30/2023   CO2 33 (H) 03/30/2023   Hypokalemia:  He is currently on potassium chloride SA 20 mEq. He has been taking 2 tablets daily for the last 2 weeks.   Left Knee pain:  Patient also complains he has been having worsening left knee pain for about a year.  Problem is getting worse. No hx of trauma. Pain is exacerbated by prolonged walking/standing. He used to see Dr. Eulah Pont at [facility].  Had a right knee replacement about 8 years ago.   Review of Systems  Constitutional:  Positive for activity change. Negative for appetite change and fever.  HENT:  Negative for nosebleeds and sore throat.   Respiratory:  Negative for cough and wheezing.   Gastrointestinal:  Negative for abdominal pain, nausea and vomiting.  Endocrine: Negative for cold intolerance and heat intolerance.  Genitourinary:  Negative for decreased urine volume, dysuria and hematuria.  Musculoskeletal:  Positive for arthralgias.  Neurological:  Negative for facial asymmetry and speech difficulty.  Psychiatric/Behavioral:  Negative for confusion. The patient is not nervous/anxious.   See other pertinent positives and negatives in HPI.  Current Outpatient Medications on File Prior to Visit  Medication Sig Dispense Refill   amLODipine (NORVASC) 10 MG tablet TAKE 1  TABLET(10 MG) BY MOUTH DAILY 90 tablet 1   Blood Glucose Monitoring Suppl (ACCU-CHEK AVIVA PLUS) w/Device KIT As directed. 1 kit 0   hydrochlorothiazide (HYDRODIURIL) 25 MG tablet Take 1 tablet (25 mg total) by mouth daily. 90 tablet 1   potassium chloride SA (KLOR-CON M) 20 MEQ tablet Take 1 tablet (20 mEq total) by mouth 2 (two) times daily. 30 tablet 1   [DISCONTINUED] cloNIDine (CATAPRES) 0.1  MG tablet Take 0.1 mg by mouth 2 (two) times daily.      No current facility-administered medications on file prior to visit.   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  Tobacco Use   Smoking status: Former    Current packs/day: 0.00    Average packs/day: 0.5 packs/day for 4.0 years (2.0 ttl pk-yrs)    Types: Cigarettes    Start date: 04/27/1982    Quit date: 04/27/1986    Years since quitting: 36.9   Smokeless tobacco: Never  Vaping Use   Vaping status: Never Used  Substance and Sexual Activity   Alcohol use: Yes    Comment: drinks alcohol 2 days per week    Drug use: No   Sexual activity: Yes  Other Topics Concern   Not on file  Social History Narrative   Not on file   Social Drivers of Health   Financial Resource Strain: Low Risk  (10/05/2022)   Overall Financial Resource Strain (CARDIA)    Difficulty of Paying Living Expenses: Not hard at all  Food Insecurity: No Food Insecurity (10/05/2022)   Hunger Vital Sign    Worried About Running Out of Food in the Last Year: Never true    Ran Out of Food in the Last Year: Never true  Transportation Needs: No Transportation Needs (10/05/2022)   PRAPARE - Administrator, Civil Service (Medical): No    Lack of Transportation (Non-Medical): No  Physical Activity: Inactive (10/05/2022)   Exercise Vital Sign    Days of Exercise per Week: 0 days     Minutes of Exercise per Session: 0 min  Stress: No Stress Concern Present (10/05/2022)   [employer] of Occupational Health - Occupational Stress Questionnaire    Feeling of Stress : Not at all  Social Connections: Socially Isolated (10/05/2022)   Social Connection and Isolation Panel [NHANES]    Frequency of Communication with Friends and Family: More than three times a week    Frequency of Social Gatherings with Friends and Family: Twice a week    Attends Religious Services: Never    Database administrator or Organizations: No    Attends Banker Meetings: Never    Marital Status: Separated   Today's Vitals   04/13/23 0906  BP: 130/80  Pulse: 77  Resp: 16  Temp: 99.1 F (37.3 C)  TempSrc: Oral  SpO2: 96%  Weight: (!) 315 lb (142.9 kg)  Height: 5' 11.5" (1.816 m)   Wt Readings from Last 3 Encounters:  04/13/23 (!) 315 lb (142.9 kg)  03/30/23 (!) 314 lb 9.6 oz (142.7 kg)  03/17/23 (!) 313 lb (142 kg)   Body mass index is 43.32 kg/m.  Physical Exam Vitals and nursing note reviewed.  Constitutional:      General: He is not in acute distress.    Appearance: He is well-developed.  HENT:     Head: Normocephalic and atraumatic.     Right Ear: Tympanic membrane, ear canal and external ear normal.     Left Ear: Tympanic membrane, ear canal and external ear normal.     Ears:     Comments: Apley maneuver and Dix-Hallpike maneuver negative.     Mouth/Throat:     Mouth: Mucous membranes are moist.     Pharynx: Oropharynx is clear. Uvula midline.  Eyes:     Conjunctiva/sclera: Conjunctivae normal.  Neck:     Vascular: No carotid bruit.  Cardiovascular:     Rate and Rhythm: Normal rate and regular rhythm.     Pulses:          Dorsalis pedis pulses are 2+ on the right side and 2+ on the left side.     Heart sounds: No murmur heard. Pulmonary:     Effort: Pulmonary effort is normal. No respiratory distress.     Breath sounds: Normal breath sounds.   Abdominal:     Palpations: Abdomen is soft. There is no mass.     Tenderness: There is no abdominal tenderness.  Musculoskeletal:     Left knee: Crepitus present. Normal range of motion. No tenderness.     Right lower leg: No edema.     Left lower leg: No edema.  Lymphadenopathy:     Cervical: No cervical adenopathy.  Skin:    General: Skin is warm.     Findings: No erythema or rash.  Neurological:     Mental Status: He is alert and oriented to person, place, and time.     Cranial Nerves: No cranial nerve deficit.     Sensory: No sensory deficit.     Motor: No weakness.     Gait: Gait normal.     Comments: Apley maneuver and Dix-Hallpike maneuver negative.   Psychiatric:        Mood and Affect: Mood and affect normal.    ASSESSMENT AND PLAN:  [name] was seen today for dizziness.   Lab Results  Component Value Date   HGBA1C 7.2 (H) 04/13/2023   Lab Results  Component Value Date   MICROALBUR 10.5 (H) 04/13/2023   MICROALBUR 4.4 (H) 09/01/2022   Lab Results  Component Value Date   CHOL 217 (H) 04/13/2023   HDL 46.30 04/13/2023   LDLCALC 155 (H) 04/13/2023   TRIG 79.0 04/13/2023   CHOLHDL 5 04/13/2023   Lab Results  Component Value Date   NA 138 04/13/2023   CL 99 04/13/2023   K 3.3 (L) 04/13/2023   CO2 32 04/13/2023   BUN 14 04/13/2023   CREATININE 1.21 04/13/2023   GFR 61.67 04/13/2023   CALCIUM 9.4 04/13/2023   PHOS 3.5 06/21/2020   ALBUMIN 4.1 03/30/2023   GLUCOSE 106 (H) 04/13/2023   Type 2 diabetes mellitus with other specified complication, without long-term current use of insulin (HCC) Assessment &  Plan: HgA1C was 6.9 in 08/2022. Currently on non pharmacologic treatment. Regular exercise and healthy diet with avoidance of added sugar food intake is an important part of treatment and recommended. Annual eye exam, periodic dental and foot care recommended. F/U in 5-6 months.  Orders: -     Basic metabolic panel; Future -     Microalbumin /  creatinine urine ratio; Future -     Hemoglobin A1c; Future -     Blood Glucose Test Strips 333; Check blood sugar daily.  Dispense: 100 strip; Refill: 2  Morbid obesity with body mass index (BMI) of 40.0 to 44.9 in adult [facility]) Assessment & Plan: He understands the benefits of wt loss as well as adverse effects of obesity. Consistency with following a healthful diet encouraged. It has been difficult to exercise due to knee pain.  Chronic pain of left knee Assessment & Plan: Most likely OA. Getting worse. He has seen ortho for right knee pain and s/p TKR. He would like a referral to ortho. Wt loss will help with progression.  Orders: -     Ambulatory referral to Orthopedics  Vertigo We discussed other possible etiologies of dizziness, Hx suggests benign vertigo, not trigger with Apley maneuver. Explained that problem can be recurrent. Fall prevention. Meclizine 25 mg tid prn, some side effects discussed. Instructed about warning signs. F/U as needed.  -     Meclizine HCl; Take 0.5-1 tablets (12.5-25 mg total) by mouth at bedtime as needed for dizziness.  Dispense: 30 tablet; Refill: 0  Essential (primary) hypertension Assessment & Plan: BP mildly elevated today and elevated during his more recent hematology visit, 145/94. For now continue Amlodipine 10 mg and hydrochlorothiazide 25 mg daily. Low salt diet recommended. Instructed to monitor BP at home, goal < 130/80. He is overdue for eye exam.  Orders: -     Basic metabolic panel; Future  Aortic atherosclerosis (HCC) Assessment & Plan: He is not on statin med. Will make recommendations according to FLP result.  Orders: -     Lipid panel; Future  Hypokalemia Assessment & Plan: Last K+ was 3.2 earlier this month. We discussed some side effects of hydrochlorothiazide. Currently on KLOR 20 meq bid. If persistently low, we need to stop hydrochlorothiazide.  Orders: -     Magnesium; Future  Encounter for HCV  screening test for low risk patient -     Hepatitis C antibody; Future  I spent a total of 42 minutes in both face to face and non face to face activities for this visit on the date of this encounter. During this time history was obtained and documented, examination was performed, prior labs reviewed, and assessment/plan discussed.  Return in about 6 months (around 10/11/2023) for chronic problems.   I, Rolla Etienne Wierda, acting as a scribe for Francetta Ilg [name], MD., have documented all relevant documentation on the behalf of Nyrah Demos [name], MD, as directed by  Larrisa Cravey [name], MD while in the presence of Vana Arif [name], MD.   I, Kayvan Hoefling [name], MD, have reviewed all documentation for this visit. The documentation on 04/13/23 for the exam, diagnosis, procedures, and orders are all accurate and complete.  Hiram Mciver G. [name], MD  [facility]. [place] office.

## 2023-04-13 NOTE — Assessment & Plan Note (Signed)
He understands the benefits of wt loss as well as adverse effects of obesity. Consistency with following a healthful diet encouraged. It has been difficult to exercise due to knee pain.

## 2023-04-13 NOTE — Assessment & Plan Note (Signed)
Most likely OA. Getting worse. He has seen ortho for right knee pain and s/p TKR. He would like a referral to ortho. Wt loss will help with progression.

## 2023-04-13 NOTE — Assessment & Plan Note (Addendum)
Last K+ was 3.2 earlier this month. We discussed some side effects of hydrochlorothiazide. Currently on KLOR 20 meq bid. If persistently low, we need to stop hydrochlorothiazide.

## 2023-04-13 NOTE — Patient Instructions (Addendum)
A few things to remember from today's visit:  Encounter for HCV screening test for low risk patient - Plan: Hepatitis C antibody  Type 2 diabetes mellitus with other specified complication, without long-term current use of insulin (HCC), Chronic - Plan: Basic metabolic panel, Microalbumin / creatinine urine ratio, Hemoglobin A1c, Glucose Blood (BLOOD GLUCOSE TEST STRIPS 333) STRP  Chronic pain of left knee - Plan: AMB referral to orthopedics  Essential (primary) hypertension - Plan: Basic metabolic panel  Aortic atherosclerosis (HCC) - Plan: Lipid panel  Hypokalemia - Plan: Magnesium  Vertigo - Plan: meclizine (ANTIVERT) 25 MG tablet  Monitor blood pressure at home, goal is under 130/80. Strips sent.  If needed take Meclizine at bedtime for dizziness. Monitor for new symptoms.  If you need refills for medications you take chronically, please call your pharmacy. Do not use My Chart to request refills or for acute issues that need immediate attention. If you send a my chart message, it may take a few days to be addressed, specially if I am not in the office.  Please be sure medication list is accurate. If a new problem present, please set up appointment sooner than planned today.

## 2023-04-13 NOTE — Assessment & Plan Note (Addendum)
BP mildly elevated today and elevated during his more recent hematology visit, 145/94. For now continue Amlodipine 10 mg and hydrochlorothiazide 25 mg daily. Low salt diet recommended. Instructed to monitor BP at home, goal < 130/80. He is overdue for eye exam.

## 2023-04-14 LAB — HEPATITIS C ANTIBODY: Hepatitis C Ab: NONREACTIVE

## 2023-04-15 ENCOUNTER — Other Ambulatory Visit: Payer: Self-pay

## 2023-04-15 DIAGNOSIS — E1169 Type 2 diabetes mellitus with other specified complication: Secondary | ICD-10-CM

## 2023-04-15 MED ORDER — DAPAGLIFLOZIN PROPANEDIOL 10 MG PO TABS: 10.0000 mg | ORAL_TABLET | Freq: Every day | ORAL | 3 refills | Status: AC

## 2023-04-15 MED ORDER — ROSUVASTATIN CALCIUM 10 MG PO TABS
10.0000 mg | ORAL_TABLET | Freq: Every day | ORAL | 3 refills | Status: DC
Start: 2023-04-15 — End: 2023-07-20

## 2023-04-15 MED ORDER — OLMESARTAN MEDOXOMIL 20 MG PO TABS: 20.0000 mg | ORAL_TABLET | Freq: Every day | ORAL | 3 refills | Status: DC

## 2023-05-19 ENCOUNTER — Other Ambulatory Visit (INDEPENDENT_AMBULATORY_CARE_PROVIDER_SITE_OTHER)

## 2023-05-19 DIAGNOSIS — E1169 Type 2 diabetes mellitus with other specified complication: Secondary | ICD-10-CM | POA: Diagnosis not present

## 2023-05-19 LAB — BASIC METABOLIC PANEL
BUN: 14 mg/dL (ref 6–23)
CO2: 31 meq/L (ref 19–32)
Calcium: 9.7 mg/dL (ref 8.4–10.5)
Chloride: 100 meq/L (ref 96–112)
Creatinine, Ser: 1.21 mg/dL (ref 0.40–1.50)
GFR: 61.63 mL/min (ref >60.00)
Glucose, Bld: 90 mg/dL (ref 70–99)
Potassium: 3.6 meq/L (ref 3.5–5.1)
Sodium: 137 meq/L (ref 135–145)

## 2023-05-20 ENCOUNTER — Other Ambulatory Visit

## 2023-05-26 ENCOUNTER — Ambulatory Visit (INDEPENDENT_AMBULATORY_CARE_PROVIDER_SITE_OTHER): Admitting: Family Medicine

## 2023-05-26 ENCOUNTER — Encounter: Payer: Self-pay | Admitting: Family Medicine

## 2023-05-26 ENCOUNTER — Ambulatory Visit: Payer: Self-pay | Admitting: Family Medicine

## 2023-05-26 VITALS — BP 168/110 | HR 107 | Temp 99.4°F | Ht 71.5 in | Wt 313.0 lb

## 2023-05-26 DIAGNOSIS — I10 Essential (primary) hypertension: Secondary | ICD-10-CM

## 2023-05-26 DIAGNOSIS — I160 Hypertensive urgency: Secondary | ICD-10-CM

## 2023-05-26 MED ORDER — HYDROCHLOROTHIAZIDE 12.5 MG PO TABS: 12.5000 mg | ORAL_TABLET | Freq: Every day | ORAL | 1 refills | Status: AC

## 2023-05-26 NOTE — Progress Notes (Signed)
 Established Patient Office Visit   Subjective  Patient ID: Eric Macdonald, male    DOB: 04-11-1955  Age: 68 y.o. MRN: [id number]  Chief Complaint  Patient presents with   Hypertension    Pt is a 68 yo male seen for HTN.  Pt states bp elevated yesterday during [facility] home visit.  Does not recall readings.  Pt checked bp at Pharmacy today at it was 216/125.  Patient BP monitoring but states that is her day.  Patient states BP meds recently adjusted.  HCTZ 25 mg daily stopped due to renal function.  Taking Norvasc 10 mg and olmesartan 20 mg daily.  Patient endorses eating Southern foods.  Has for fried pork chops per week.  Recent started making biscuits    Patient Active Problem List   Diagnosis Date Noted   Morbid obesity with body mass index (BMI) of 40.0 to 44.9 in adult [facility]) 04/13/2023   Chronic pain of left knee 04/13/2023   Type 2 diabetes mellitus with other specified complication, without long-term current use of insulin (HCC) 09/01/2022   Aortic atherosclerosis (HCC) 09/01/2022   B12 deficiency anemia 07/21/2020   Hypokalemia 06/21/2020   Transfusion of blood product refused for religious reason 06/21/2020   Essential (primary) hypertension    Chronic low back pain    Cancer of sigmoid colon (HCC) 06/20/2020   Insomnia 07/23/2013   Past Medical History:  Diagnosis Date   Arthritis    Cancer (HCC)    colon   Chronic low back pain    Is supposed to have surgery soon at L4-5   Diabetes mellitus without complication (HCC)    Dx'ed 09/01/22 with HgA1C 6.9   Hypertension    Knee pain    Past Surgical History:  Procedure Laterality Date   JOINT REPLACEMENT     knee 05/2011   KNEE SURGERY  08   rt arthroscopy   LAPAROSCOPIC PARTIAL COLECTOMY N/A 06/20/2020   Procedure: LAPAROSCOPIC PARTIAL COLECTOMY;  Surgeon: Romie Levee, MD;  Location: [facility] ORS;  Service: General;  Laterality: N/A;   TOTAL KNEE ARTHROPLASTY  05/05/2011   Procedure: TOTAL KNEE ARTHROPLASTY;   Surgeon: Loreta Ave, MD;  Location: [facility] OR;  Service: Orthopedics;  Laterality: Right;  DR MURPHY WANTS 90 MINUTES FOR THIS CASE   Social History   Tobacco Use   Smoking status: Former    Current packs/day: 0.00    Average packs/day: 0.5 packs/day for 4.0 years (2.0 ttl pk-yrs)    Types: Cigarettes    Start date: 04/27/1982    Quit date: 04/27/1986    Years since quitting: 37.1   Smokeless tobacco: Never  Vaping Use   Vaping status: Never Used  Substance Use Topics   Alcohol use: Yes    Comment: drinks alcohol 2 days per week    Drug use: No   Family History  Problem Relation Age of Onset   Cancer Sister        pancreatic   Cancer Maternal Uncle        colon   Cancer Maternal Grandmother        colon   Cancer Cousin        pancreatic   Allergies  Allergen Reactions   Other     Blood products Refusal       ROS Negative unless stated above    Objective:     BP (!) 168/110 (BP Location: Left Arm, Patient Position: Sitting, Cuff Size: Large)   Pulse Marland Kitchen)  107   Temp 99.4 F (37.4 C) (Oral)   Ht 5' 11.5" (1.816 m)   Wt (!) 313 lb (142 kg)   SpO2 96%   BMI 43.05 kg/m  BP Readings from Last 3 Encounters:  05/29/23 (!) 165/127  05/26/23 (!) 168/110  04/13/23 130/80   Wt Readings from Last 3 Encounters:  05/29/23 (!) 312 lb (141.5 kg)  05/26/23 (!) 313 lb (142 kg)  04/13/23 (!) 315 lb (142.9 kg)      Physical Exam Constitutional:      Appearance: Normal appearance.  HENT:     Head: Normocephalic and atraumatic.     Nose: Nose normal.     Mouth/Throat:     Mouth: Mucous membranes are moist.  Eyes:     Conjunctiva/sclera: Conjunctivae normal.     Pupils: Pupils are equal, round, and reactive to light.  Cardiovascular:     Rate and Rhythm: Normal rate.  Pulmonary:     Effort: Pulmonary effort is normal.  Skin:    General: Skin is warm and dry.  Neurological:     Mental Status: He is alert and oriented to person, place, and time.     No  results found for any visits on 05/26/23.    Assessment & Plan:  Hypertensive urgency  Essential hypertension -     hydroCHLOROthiazide; Take 1 tablet (12.5 mg total) by mouth daily.  Dispense: 90 tablet; Refill: 1  Patient seen for hypertensive urgency.  BP meds adjusted due to elevation in creatinine 1.51 with GFR 50 on 03/30/2023 with recheck on 04/13/2023 1.21 and 61.67 respectively.  Creatinine on 05/19/2023 1.21 with GFR 61.63.  Discussed options such as increasing olmesartan versus changing medications.  Will have patient continue taking Norvasc 10 mg daily, olmesartan 20 mg daily and add back HCTZ 12.5 mg.  Patient to obtain new battery for BP monitor.  Start checking BP daily and bring readings to next OFV with PCP.  Can recheck renal function at that time.  Advised to increase intake of water and make changes to diet such as decreasing intake of fried foods and sodium.  Return in about 3 weeks (around 06/16/2023).   Deeann Saint, MD

## 2023-05-26 NOTE — Telephone Encounter (Signed)
  Chief Complaint: elevated BP Symptoms: high BP reading Frequency: couple days Pertinent Negatives: Patient denies chest pain, dizziness, sob, burred vision Disposition: [] ED /[] Urgent Care (no appt availability in office) / [x] Appointment(In office/virtual)/ []  [facility]/ [] Home Care/ [] Refused Recommended Disposition /[] [facility]/ []  Follow-up with PCP Additional Notes: Patient states that he was started on new BP meds a few weeks ago. Yesterday his [facility] nurse told him his BP was high and to take it again in an hour but he didn't. Today while at the drug store he sat at the machine and took his BP and it was 216/125.  Patient denies any symptoms.  Patient encouraged to get a new battery for his cuff at home and to take BP twice a day and record in journal as well.     Copied from CRM [id number]. Topic: Clinical - Red Word Triage >> May 26, 2023  1:17 PM Deaijah H wrote: Red Word that prompted transfer to Nurse Triage: BP High 216/125 - was placed on new medication not sure if it's regarding that Reason for Disposition  [1] Systolic BP  >= 200 OR Diastolic >= 120 AND [2] having NO cardiac or neurologic symptoms  Answer Assessment - Initial Assessment Questions 1. BLOOD PRESSURE: "What is the blood pressure?" "Did you take at least two measurements 5 minutes apart?"     216/125 2. ONSET: "When did you take your blood pressure?"     5 minutes go 3. HOW: "How did you take your blood pressure?" (e.g., automatic home BP monitor, visiting nurse)     Drug store machine 4. HISTORY: "Do you have a history of high blood pressure?"     yes 5. MEDICINES: "Are you taking any medicines for blood pressure?" "Have you missed any doses recently?"     Just started new BP medication 6. OTHER SYMPTOMS: "Do you have any symptoms?" (e.g., blurred vision, chest pain, difficulty breathing, headache, weakness)     no  Protocols used: Blood Pressure - High-A-AH

## 2023-05-27 ENCOUNTER — Encounter: Payer: Self-pay | Admitting: Nurse Practitioner

## 2023-05-29 ENCOUNTER — Encounter (HOSPITAL_COMMUNITY): Payer: Self-pay

## 2023-05-29 ENCOUNTER — Ambulatory Visit (HOSPITAL_COMMUNITY)
Admission: EM | Admit: 2023-05-29 | Discharge: 2023-05-29 | Disposition: A | Discharge status: Home / Self Care | Attending: Family Medicine | Admitting: Family Medicine

## 2023-05-29 DIAGNOSIS — I159 Secondary hypertension, unspecified: Secondary | ICD-10-CM

## 2023-05-29 DIAGNOSIS — I10 Essential (primary) hypertension: Secondary | ICD-10-CM | POA: Diagnosis not present

## 2023-05-29 NOTE — ED Triage Notes (Signed)
 Patient here today with c/o high blood pressure readings. He take Olmesartan and HCTZ. Patient states that his medication was changed 3 weeks ago and has been elevated since the change.

## 2023-05-29 NOTE — ED Provider Notes (Signed)
 [facility]    CSN: [id number] Arrival date & time: 05/29/23  1253      History   Chief Complaint Chief Complaint  Patient presents with   Hypertension    HPI Eric Macdonald is a 68 y.o. male.   The history is provided by the patient. No language interpreter was used.  Hypertension This is a chronic problem. Episode frequency: BP has been running high in the last few day. Associated symptoms include headaches. Associated symptoms comments: Endorsed headaches x 5 days. Now his headache is about 4/10 in severity. No nausea or vomiting. Some vision changes. He denies any gait concerns. Denies falls, LOC or seizures. Nothing aggravates the symptoms.   PCP changed his BP meds recently. He is compliant with Benicar 20 mg every day. He was previously on hydrochlorothiazide 25 mg every day and Norvasc 10 mg every day. However, these were discontinued by PCP and advised to pick up hydrochlorothiazide 12.5 mg every day at the pharmacy, which he has yet to pick up. However, he has been cutting his home hydrochlorothiazide 25 mg to half about 2 days ago with no improvement.   Past Medical History:  Diagnosis Date   Arthritis    Cancer (HCC)    colon   Chronic low back pain    Is supposed to have surgery soon at L4-5   Diabetes mellitus without complication (HCC)    Dx'ed 09/01/22 with HgA1C 6.9   Hypertension    Knee pain     Patient Active Problem List   Diagnosis Date Noted   Morbid obesity with body mass index (BMI) of 40.0 to 44.9 in adult [facility]) 04/13/2023   Chronic pain of left knee 04/13/2023   Type 2 diabetes mellitus with other specified complication, without long-term current use of insulin (HCC) 09/01/2022   Aortic atherosclerosis (HCC) 09/01/2022   B12 deficiency anemia 07/21/2020   Hypokalemia 06/21/2020   Transfusion of blood product refused for religious reason 06/21/2020   Essential (primary) hypertension    Chronic low back pain    Cancer of  sigmoid colon (HCC) 06/20/2020   Insomnia 07/23/2013    Past Surgical History:  Procedure Laterality Date   JOINT REPLACEMENT     knee 05/2011   KNEE SURGERY  08   rt arthroscopy   LAPAROSCOPIC PARTIAL COLECTOMY N/A 06/20/2020   Procedure: LAPAROSCOPIC PARTIAL COLECTOMY;  Surgeon: Romie Levee, MD;  Location: [facility];  Service: General;  Laterality: N/A;   TOTAL KNEE ARTHROPLASTY  05/05/2011   Procedure: TOTAL KNEE ARTHROPLASTY;  Surgeon: Loreta Ave, MD;  Location: [facility] OR;  Service: Orthopedics;  Laterality: Right;  DR MURPHY WANTS 90 MINUTES FOR THIS CASE       Home Medications    Prior to Admission medications   Medication Sig Start Date End Date Taking? Authorizing Provider  amLODipine (NORVASC) 10 MG tablet TAKE 1 TABLET(10 MG) BY MOUTH DAILY Patient not taking: Reported on 05/29/2023 03/21/23   [name], [name], MD  Blood Glucose Monitoring Suppl (ACCU-CHEK AVIVA PLUS) w/Device KIT As directed. 09/01/22   [name], [name], MD  dapagliflozin propanediol (FARXIGA) 10 MG TABS tablet Take 1 tablet (10 mg total) by mouth daily before breakfast. 04/15/23   [name], [name], MD  Glucose Blood (BLOOD GLUCOSE TEST STRIPS 333) STRP Check blood sugar daily. 04/13/23   [name], [name], MD  hydrochlorothiazide (HYDRODIURIL) 12.5 MG tablet Take 1 tablet (12.5 mg total) by mouth daily. 05/26/23   Deeann Saint,  MD  olmesartan (BENICAR) 20 MG tablet Take 1 tablet (20 mg total) by mouth daily. 04/15/23   [name], [name], MD  rosuvastatin (CRESTOR) 10 MG tablet Take 1 tablet (10 mg total) by mouth daily. 04/15/23   [name], [name], MD  cloNIDine (CATAPRES) 0.1 MG tablet Take 0.1 mg by mouth 2 (two) times daily.   03/05/15  [provider]    Family History Family History  Problem Relation Age of Onset   Cancer Sister        pancreatic   Cancer Maternal Uncle        colon   Cancer Maternal Grandmother        colon   Cancer Cousin        pancreatic    Social History Social History    Tobacco Use   Smoking status: Former    Current packs/day: 0.00    Average packs/day: 0.5 packs/day for 4.0 years (2.0 ttl pk-yrs)    Types: Cigarettes    Start date: 04/27/1982    Quit date: 04/27/1986    Years since quitting: 37.1   Smokeless tobacco: Never  Vaping Use   Vaping status: Never Used  Substance Use Topics   Alcohol use: Yes    Comment: drinks alcohol 2 days per week    Drug use: No     Allergies   Other   Review of Systems Review of Systems  Neurological:  Positive for headaches.     Physical Exam Triage Vital Signs ED Triage Vitals  Encounter Vitals Group     BP 05/29/23 1335 (!) 157/113     Systolic BP Percentile --      Diastolic BP Percentile --      Pulse Rate 05/29/23 1335 89     Resp 05/29/23 1335 16     Temp 05/29/23 1335 98.7 F (37.1 C)     Temp Source 05/29/23 1335 Oral     SpO2 05/29/23 1335 93 %     Weight 05/29/23 1335 (!) 312 lb (141.5 kg)     Height 05/29/23 1335 5' 11.5" (1.816 m)     Head Circumference --      Peak Flow --      Pain Score 05/29/23 1334 4     Pain Loc --      Pain Education --      Exclude from Growth Chart --    No data found.  Updated Vital Signs BP (!) 165/127   Pulse 89   Temp 98.7 F (37.1 C) (Oral)   Resp 16   Ht 5' 11.5" (1.816 m)   Wt (!) 141.5 kg   SpO2 93%   BMI 42.91 kg/m   Visual Acuity Right Eye Distance:   Left Eye Distance:   Bilateral Distance:    Right Eye Near:   Left Eye Near:    Bilateral Near:     Physical Exam Vitals and nursing note reviewed.  Cardiovascular:     Rate and Rhythm: Normal rate and regular rhythm.     Heart sounds: Normal heart sounds. No murmur heard. Pulmonary:     Effort: Pulmonary effort is normal. No respiratory distress.     Breath sounds: Normal breath sounds.  Neurological:     General: No focal deficit present.     Cranial Nerves: Cranial nerves 2-12 are intact.     Sensory: Sensation is intact.     Coordination: Coordination is intact.      Gait: Gait  is intact.      UC Treatments / Results  Labs (all labs ordered are listed, but only abnormal results are displayed) Labs Reviewed - No data to display  EKG   Radiology No results found.  Procedures Procedures (including critical care time)  Medications Ordered in UC Medications - No data to display  Initial Impression / Assessment and Plan / UC Course  I have reviewed the triage vital signs and the nursing notes.  Pertinent labs & imaging results that were available during my care of the patient were reviewed by me and considered in my medical decision making (see chart for details).  Clinical Course as of 05/29/23 1406  Sun May 29, 2023  1406 Severe HTN despite Benicar 20 mg QD and HCTZ 12.5 mg QD I saw the note from PCP's office from 05/26/23. Unfortunately, this note is currently uncompleted, and I am unable to view their recommendation. Since he had done well on HCTZ 25 mg QD and Norvasc in the past, per his report, I recommended that he continue Benicar 20 mg QD per PCP and return back to his full dose of HTCZ 25 mg QD. Contact PCP tomorrow for BP management. He might need to get back on Norvasc as well. Keep well hydrated and recheck renal function with PCP. Note that given the severity of his symptoms, I recommended an ED eval, but he declined. Return precautions discussed He agreed with the plan.  [KE]    Clinical Course User Index [KE] Doreene Eland, MD     Final Clinical Impressions(s) / UC Diagnoses   Final diagnoses:  Secondary hypertension  Severe hypertension     Discharge Instructions      It was nice seeing you today. Your BP remains elevated despite taking your Benicar 20 mg QD and HCTZ 12.5 mg QD.  I recommended ED evaluation today, given your headache and vision change, but you prefer to wait to talk with your PCP. Please continue Benicar and resume HCTZ 25 mg QD. Call your PCP tomorrow for medication adjustment. Please  proceed to the ED of your headache worsens or you develop nausea or vomiting.      ED Prescriptions   None    PDMP not reviewed this encounter.   Doreene Eland, MD 05/29/23 [phone]

## 2023-05-29 NOTE — Discharge Instructions (Signed)
 It was nice seeing you today. Your BP remains elevated despite taking your Benicar 20 mg QD and HCTZ 12.5 mg QD.  I recommended ED evaluation today, given your headache and vision change, but you prefer to wait to talk with your PCP. Please continue Benicar and resume HCTZ 25 mg QD. Call your PCP tomorrow for medication adjustment. Please proceed to the ED of your headache worsens or you develop nausea or vomiting.

## 2023-05-30 ENCOUNTER — Encounter: Payer: Self-pay | Admitting: Nurse Practitioner

## 2023-05-31 ENCOUNTER — Encounter: Payer: Self-pay | Admitting: Family Medicine

## 2023-05-31 ENCOUNTER — Ambulatory Visit (INDEPENDENT_AMBULATORY_CARE_PROVIDER_SITE_OTHER): Admitting: Family Medicine

## 2023-05-31 ENCOUNTER — Encounter: Payer: Self-pay | Admitting: Nurse Practitioner

## 2023-05-31 VITALS — BP 142/90 | HR 76 | Resp 16 | Ht 71.5 in | Wt 312.0 lb

## 2023-05-31 DIAGNOSIS — I10 Essential (primary) hypertension: Secondary | ICD-10-CM | POA: Diagnosis not present

## 2023-05-31 MED ORDER — AMLODIPINE BESYLATE 10 MG PO TABS: 10.0000 mg | ORAL_TABLET | Freq: Every day | ORAL | 1 refills | Status: DC

## 2023-05-31 MED ORDER — OLMESARTAN MEDOXOMIL 20 MG PO TABS: 20.0000 mg | ORAL_TABLET | Freq: Every day | ORAL | 1 refills | Status: DC

## 2023-05-31 NOTE — Assessment & Plan Note (Signed)
 BP is not well controlled. Possible complications of elevated BP discussed. He has not been taking Amlodipine 10 mg, so resumed today. Continue HCTZ 12.5 mg daily and olmesartan 20 mg daily. Low salt/DASH diet recommended. Continue monitoring BP regularly. Instructed about warning signs. Follow-up in 3 to 4 weeks, before if needed.

## 2023-05-31 NOTE — Progress Notes (Signed)
 ACUTE VISIT Chief Complaint  Patient presents with   Follow-up   HPI: Eric Macdonald is a 68 y.o. male with a PMHx significant for HTN, colon cancer, DM II, insomnia, chronic low back pain, and B12 deficiency anemia, who is here today for ER follow up. He was seen here in the office on 04/13/2023, after blood work was done, it was recommended to stop HCTZ 25 mg due to hypokalemia. Olmesartan 20 mg was added on 04/15/23.  He had a home visit with a [facility] nurse last week and was advised to see PCP because his BP was elevated. He had a visit with Dr. Salomon Fick on 3/13, and was put on hydrochlorothiazide 12.5 mg.  Lab Results  Component Value Date   NA 137 05/19/2023   CL 100 05/19/2023   K 3.6 05/19/2023   CO2 31 05/19/2023   BUN 14 05/19/2023   CREATININE 1.21 05/19/2023   GFR 61.63 05/19/2023   CALCIUM 9.7 05/19/2023   PHOS 3.5 06/21/2020   ALBUMIN 4.1 03/30/2023   GLUCOSE 90 05/19/2023   He mentions he has not been taking amlodipine since his visit on 1/29.   On the morning of 3/16, he woke up with blurry vision and his BP was 151/108. He checked it again at lunch and it was 173/108. He also mentions he had been having frontal headaches for five days at that time.  He presented to the ED on 05/29/2023. None of his medications were changed in the ER.  His vision has returned to normal and headache has resolved.  Also denies chest pain, SOB, palpitations, or dizziness.  Patient admits he has not been following a low salt diet.   Review of Systems  Constitutional:  Negative for activity change, appetite change and fever.  HENT:  Negative for mouth sores and sore throat.   Respiratory:  Negative for cough and wheezing.   Gastrointestinal:  Negative for abdominal pain, nausea and vomiting.  Endocrine: Negative for cold intolerance and heat intolerance.  Genitourinary:  Negative for decreased urine volume, dysuria and hematuria.  Skin:  Negative for rash.   Neurological:  Negative for syncope, facial asymmetry and weakness.  See other pertinent positives and negatives in HPI.  Current Outpatient Medications on File Prior to Visit  Medication Sig Dispense Refill   Blood Glucose Monitoring Suppl (ACCU-CHEK AVIVA PLUS) w/Device KIT As directed. 1 kit 0   dapagliflozin propanediol (FARXIGA) 10 MG TABS tablet Take 1 tablet (10 mg total) by mouth daily before breakfast. 30 tablet 3   Glucose Blood (BLOOD GLUCOSE TEST STRIPS 333) STRP Check blood sugar daily. 100 strip 2   hydrochlorothiazide (HYDRODIURIL) 12.5 MG tablet Take 1 tablet (12.5 mg total) by mouth daily. 90 tablet 1   rosuvastatin (CRESTOR) 10 MG tablet Take 1 tablet (10 mg total) by mouth daily. 30 tablet 3   [DISCONTINUED] cloNIDine (CATAPRES) 0.1 MG tablet Take 0.1 mg by mouth 2 (two) times daily.      No current facility-administered medications on file prior to visit.   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  Tobacco Use   Smoking status: Former    Current packs/day: 0.00    Average packs/day: 0.5 packs/day for 4.0 years (2.0 ttl pk-yrs)    Types: Cigarettes    Start date: 04/27/1982    Quit date: 04/27/1986    Years since quitting: 37.1   Smokeless tobacco: Never  Vaping Use   Vaping status: Never Used  Substance and Sexual Activity   Alcohol use: Yes    Comment: drinks alcohol 2 days per week     Drug use: No   Sexual activity: Yes  Other Topics Concern   Not on file  Social History Narrative   Not on file   Social Drivers of Health   Financial Resource Strain: Low Risk  (10/05/2022)   Overall Financial Resource Strain (CARDIA)    Difficulty of Paying Living Expenses: Not hard at all  Food Insecurity: No Food Insecurity (10/05/2022)   Hunger Vital Sign    Worried About Running Out of Food in the Last Year: Never true    Ran Out of Food in the Last Year: Never true  Transportation Needs: No Transportation Needs (10/05/2022)   PRAPARE - Administrator, Civil Service (Medical): No    Lack of Transportation (Non-Medical): No  Physical Activity: Inactive (10/05/2022)   Exercise Vital Sign    Days of Exercise per Week: 0 days    Minutes of Exercise per Session: 0 min  Stress: No Stress Concern Present (10/05/2022)   [employer] of Occupational Health - Occupational Stress Questionnaire    Feeling of Stress : Not at all  Social Connections: Socially Isolated (10/05/2022)   Social Connection and Isolation Panel [NHANES]    Frequency of Communication with Friends and Family: More than three times a week    Frequency of Social Gatherings with Friends and Family: Twice a week    Attends Religious Services: Never    Database administrator or Organizations: No    Attends Banker Meetings: Never    Marital Status: Separated   Vitals:   05/31/23 1043 05/31/23 1135  BP: (!) 142/90 (!) 142/90  Pulse: 76   Resp: 16   SpO2: 97%    Body mass index is 42.91 kg/m.  Physical Exam Vitals and nursing note reviewed.  Constitutional:      General: He is not in acute distress.    Appearance: He is well-developed.  HENT:     Head: Normocephalic and atraumatic.     Mouth/Throat:     Mouth: Mucous membranes are moist.     Pharynx: Oropharynx is clear.  Eyes:     Conjunctiva/sclera: Conjunctivae normal.  Cardiovascular:     Rate and Rhythm: Normal rate and  regular rhythm.     Heart sounds: No murmur heard. Pulmonary:     Effort: Pulmonary effort is normal. No respiratory distress.     Breath sounds: Normal breath sounds.  Abdominal:     Palpations: Abdomen is soft. There is no mass.     Tenderness: There is no abdominal tenderness.  Musculoskeletal:     Right lower leg: No edema.     Left lower leg:  No edema.  Skin:    General: Skin is warm.     Findings: No erythema or rash.  Neurological:     Mental Status: He is alert and oriented to person, place, and time.     Cranial Nerves: No cranial nerve deficit.     Gait: Gait normal.  Psychiatric:        Mood and Affect: Mood and affect normal.   ASSESSMENT AND PLAN:  Mr. Pettis was seen today for ER follow up for elevated blood pressure.   Essential (primary) hypertension Assessment & Plan: BP is not well controlled. Possible complications of elevated BP discussed. He has not been taking Amlodipine 10 mg, so resumed today. Continue HCTZ 12.5 mg daily and olmesartan 20 mg daily. Low salt/DASH diet recommended. Continue monitoring BP regularly. Instructed about warning signs. Follow-up in 3 to 4 weeks, before if needed.  Orders: -     amLODIPine Besylate; Take 1 tablet (10 mg total) by mouth daily.  Dispense: 90 tablet; Refill: 1 -     Olmesartan Medoxomil; Take 1 tablet (20 mg total) by mouth daily.  Dispense: 90 tablet; Refill: 1  I spent a total of 31 minutes in both face to face and non face to face activities for this visit on the date of this encounter. During this time history was obtained and documented, examination was performed, prior labs reviewed, and assessment/plan discussed.  Return in about 4 weeks (around 06/28/2023).  I, Rolla Etienne Wierda, acting as a scribe for Rudolf Blizard [name], MD., have documented all relevant documentation on the behalf of Kaycie Pegues [name], MD, as directed by  Leno Mathes [name], MD while in the presence of Zed Wanninger [name], MD.   I, Ledonna Dormer [name], MD, have  reviewed all documentation for this visit. The documentation on 05/31/23 for the exam, diagnosis, procedures, and orders are all accurate and complete.  Kmari Brian G. [name], MD  [facility]. [place] office.

## 2023-05-31 NOTE — Patient Instructions (Addendum)
 A few things to remember from today's visit:  Essential (primary) hypertension  Resume Amlodipine 10 mg daily. Continue hydrochlorothiazide 12.5 mg daily and Olmesartan 20 mg daily.  If you need refills for medications you take chronically, please call your pharmacy. Do not use My Chart to request refills or for acute issues that need immediate attention. If you send a my chart message, it may take a few days to be addressed, specially if I am not in the office.  Please be sure medication list is accurate. If a new problem present, please set up appointment sooner than planned today.

## 2023-06-15 ENCOUNTER — Encounter: Payer: 59 | Attending: Family Medicine | Admitting: Dietician

## 2023-06-15 ENCOUNTER — Encounter: Payer: Self-pay | Admitting: Dietician

## 2023-06-15 VITALS — Wt 311.8 lb

## 2023-06-15 DIAGNOSIS — E119 Type 2 diabetes mellitus without complications: Secondary | ICD-10-CM | POA: Diagnosis present

## 2023-06-15 NOTE — Progress Notes (Signed)
 Diabetes Self-Management Education  Visit Type: Follow-up  Appt. Start Time: 1510 Appt. End Time: 1540  06/15/2023  [name], identified by name and date of birth, is a 68 y.o. male with a diagnosis of Diabetes:  .   ASSESSMENT  History includes: type 2 diabetes, arthritis, cancer, HTN Labs noted: 04/13/23 A1c 7.2% Medications include: reviewed, farxiga Supplements: goes to cancer center once a month for vitamin B12 injection  Weight History 06/15/2023: 311.8 lb 03/17/2023: 313 lb 11/18/2022: 319 lb  A1c up from 6.9% in June 2024 to 7.2% in January 2025.   Pt reports he hasn't been drinking juice like he used to and states it is almost never now. Pt states he continues to use 2 spoons sugar in his coffee. Pt states he has increased his water to 2 of his large cups daily. Pt reports he has been going on a walk past the dumpster once per week and feels that now that the weather is warmer he will be going more, especially if his neighbor joins. Pt states he hasn't been eating as much carrot cake.   Pt reports he has been battling with his blood pressure and states he thought his amlodipine was discontinued so he stopped taking it but is taking it again.   Pt reports his oldest grand-daughter is getting married which he is looking forward to.   New Goals (established by patient):   Goal: Go walking 3 times per week.   Goal: Drink 2 1/2 of your cups of water daily.    Assessment of Previous Goals:    Goal: After your big morning glass of water, refill it and drink on it the rest of the day. - goal met, increased goal.    Goal: Walk down to dumpster and back more often. - goal met, increased goal.   Goal: try to use 1 spoon of sugar in your coffee instead of 2. - goal not met, continue.    Goal: aim to drink less juice. Try to cut half and half with water, or try to have less in general. - goal met, pt states he hasn't been drinking juice.   Weight (!) 311 lb 12.8 oz  (141.4 kg). Body mass index is 42.88 kg/m.   Diabetes Self-Management Education - 06/15/23 1512       Visit Information   Visit Type Follow-up      Health Coping   How would you rate your overall health? Good      Psychosocial Assessment   Patient Belief/Attitude about Diabetes Motivated to manage diabetes    What is the hardest part about your diabetes right now, causing you the most concern, or is the most worrisome to you about your diabetes?   Making healty food and beverage choices    Self-care barriers None    Self-management support Doctor's office    Other persons present Patient    Patient Concerns Nutrition/Meal planning    Special Needs None    Preferred Learning Style No preference indicated    Learning Readiness Ready      Pre-Education Assessment   Patient understands the diabetes disease and treatment process. Needs Review    Patient understands incorporating nutritional management into lifestyle. Needs Review    Patient undertands incorporating physical activity into lifestyle. Needs Review    Patient understands using medications safely. Needs Review    Patient understands monitoring blood glucose, interpreting and using results Needs Review    Patient understands prevention, detection, and  treatment of acute complications. Needs Review    Patient understands prevention, detection, and treatment of chronic complications. Needs Review    Patient understands how to develop strategies to address psychosocial issues. Needs Review    Patient understands how to develop strategies to promote health/change behavior. Needs Review      Complications   Last HgB A1C per patient/outside source 7.2 %    How often do you check your blood sugar? 0 times/day (not testing)      Dietary Intake   Breakfast cranberry bread    Lunch some leftovers    Dinner grilled chicken salad from fresh market    Beverage(s) 40 oz water, 2 beers, coffee      Activity / Exercise   Activity  / Exercise Type Light (walking / raking leaves)    How many days per week do you exercise? 1    How many minutes per day do you exercise? 15    Total minutes per week of exercise 15      Patient Education   Previous Diabetes Education Yes (please comment)    Disease Pathophysiology Explored patient's options for treatment of their diabetes    Healthy Eating Role of diet in the treatment of diabetes and the relationship between the three main macronutrients and blood glucose level;Plate Method;Meal timing in regards to the patients' current diabetes medication.    Being Active Role of exercise on diabetes management, blood pressure control and cardiac health.;Helped patient identify appropriate exercises in relation to his/her diabetes, diabetes complications and other health issue.    Medications Reviewed patients medication for diabetes, action, purpose, timing of dose and side effects.    Monitoring Identified appropriate SMBG and/or A1C goals.    Chronic complications Relationship between chronic complications and blood glucose control;Identified and discussed with patient  current chronic complications    Diabetes Stress and Support Worked with patient to identify barriers to care and solutions;Identified and addressed patients feelings and concerns about diabetes    Lifestyle and Health Coping Lifestyle issues that need to be addressed for better diabetes care      Individualized Goals (developed by patient)   Nutrition General guidelines for healthy choices and portions discussed    Physical Activity Exercise 3-5 times per week;15 minutes per day    Medications take my medication as prescribed    Monitoring  Test my blood glucose as discussed    Problem Solving Eating Pattern    Reducing Risk examine blood glucose patterns;do foot checks daily;treat hypoglycemia with 15 grams of carbs if blood glucose less than 70mg /dL    Health Coping Ask for help with psychological, social, or  emotional issues      Patient Self-Evaluation of Goals - Patient rates self as meeting previously set goals (% of time)   Nutrition 50 - 75 % (half of the time)    Physical Activity 25 - 50% (sometimes)    Medications >75% (most of the time)    Monitoring Not Applicable    Problem Solving and behavior change strategies  50 - 75 % (half of the time)    Reducing Risk (treating acute and chronic complications) 50 - 75 % (half of the time)    Health Coping 50 - 75 % (half of the time)      Post-Education Assessment   Patient understands the diabetes disease and treatment process. Comprehends key points    Patient understands incorporating nutritional management into lifestyle. Comprehends key points  Patient undertands incorporating physical activity into lifestyle. Comprehends key points    Patient understands using medications safely. Comphrehends key points    Patient understands monitoring blood glucose, interpreting and using results Comprehends key points    Patient understands prevention, detection, and treatment of acute complications. Comprehends key points    Patient understands prevention, detection, and treatment of chronic complications. Comprehends key points    Patient understands how to develop strategies to address psychosocial issues. Comprehends key points    Patient understands how to develop strategies to promote health/change behavior. Comprehends key points      Outcomes   Expected Outcomes Demonstrated interest in learning. Expect positive outcomes    Future DMSE 3-4 months    Program Status Not Completed      Subsequent Visit   Since your last visit have you continued or begun to take your medications as prescribed? Yes    Since your last visit have you had your blood pressure checked? Yes    Since your last visit have you experienced any weight changes? Loss    Since your last visit, are you checking your blood glucose at least once a day? No              Individualized Plan for Diabetes Self-Management Training:   Learning Objective:  Patient will have a greater understanding of diabetes self-management. Patient education plan is to attend individual and/or group sessions per assessed needs and concerns.   Plan:   Patient Instructions  New Goals (established by patient):   Goal: Go walking 3 times per week.   Goal: Drink 2 1/2 of your cups of water daily.   Expected Outcomes:  Demonstrated interest in learning. Expect positive outcomes  Education material provided: no handouts on this follow up  If problems or questions, patient to contact team via:  Phone  Future DSME appointment: 3-4 months

## 2023-06-15 NOTE — Patient Instructions (Signed)
 New Goals (established by patient):   Goal: Go walking 3 times per week.   Goal: Drink 2 1/2 of your cups of water daily.

## 2023-06-21 ENCOUNTER — Ambulatory Visit (HOSPITAL_COMMUNITY)
Admission: RE | Admit: 2023-06-21 | Discharge: 2023-06-21 | Disposition: A | Discharge status: Home / Self Care | Source: Ambulatory Visit | Attending: Hematology | Admitting: Hematology

## 2023-06-21 DIAGNOSIS — C187 Malignant neoplasm of sigmoid colon: Secondary | ICD-10-CM | POA: Diagnosis present

## 2023-06-21 MED ORDER — IOHEXOL 9 MG/ML PO SOLN
ORAL | Status: AC
  Filled 2023-06-21: qty 1000

## 2023-06-21 MED ORDER — SODIUM CHLORIDE (PF) 0.9 % IJ SOLN
INTRAMUSCULAR | Status: AC
  Filled 2023-06-21: qty 50

## 2023-06-21 MED ORDER — IOHEXOL 300 MG/ML  SOLN
100.0000 mL | Freq: Once | INTRAMUSCULAR | Status: AC | PRN
  Administered 2023-06-21: 100 mL via INTRAVENOUS

## 2023-06-21 MED ORDER — IOHEXOL 9 MG/ML PO SOLN
1000.0000 mL | ORAL | Status: AC
  Administered 2023-06-21: 1000 mL via ORAL

## 2023-06-23 ENCOUNTER — Encounter: Payer: Self-pay | Admitting: Family Medicine

## 2023-06-23 ENCOUNTER — Ambulatory Visit (INDEPENDENT_AMBULATORY_CARE_PROVIDER_SITE_OTHER): Admitting: Family Medicine

## 2023-06-23 VITALS — BP 134/86 | HR 82 | Temp 98.6°F | Resp 16 | Ht 71.5 in | Wt 313.6 lb

## 2023-06-23 DIAGNOSIS — C187 Malignant neoplasm of sigmoid colon: Secondary | ICD-10-CM | POA: Diagnosis not present

## 2023-06-23 DIAGNOSIS — I10 Essential (primary) hypertension: Secondary | ICD-10-CM

## 2023-06-23 DIAGNOSIS — K760 Fatty (change of) liver, not elsewhere classified: Secondary | ICD-10-CM | POA: Insufficient documentation

## 2023-06-23 MED ORDER — AMLODIPINE-OLMESARTAN 10-40 MG PO TABS: 1.0000 | ORAL_TABLET | Freq: Every day | ORAL | 2 refills | Status: AC

## 2023-06-23 NOTE — Assessment & Plan Note (Addendum)
 Seen on abdominal CT 06/21/23. Stressed the importance of wt loss. Continue low fat and carbs diet. LFT's has been in normal range.

## 2023-06-23 NOTE — Patient Instructions (Addendum)
 A few things to remember from today's visit:  Essential (primary) hypertension - Plan: amLODipine-olmesartan (AZOR) 10-40 MG tablet Stop the Olmesartan and Amlodipine you have at home and start the combination tab with both medications.  Continue hydrochlorothiazide same dose. Continue monitoring blood pressure and I will see you back in 09/2023.  If you need refills for medications you take chronically, please call your pharmacy. Do not use My Chart to request refills or for acute issues that need immediate attention. If you send a my chart message, it may take a few days to be addressed, specially if I am not in the office.  Please be sure medication list is accurate. If a new problem present, please set up appointment sooner than planned today.

## 2023-06-23 NOTE — Assessment & Plan Note (Signed)
 BP is not well controlled. Possible complications of elevated BP discussed. Changes today:Increased dose of Olmesartan from 20 mg to 40 mg, no changes in Amlodipine and hydrochlorothiazide same dose. Changed to combination tab Amlodipine-Olmesartan 10-40 mg daily. Low salt diet. Monitor BP at home. Instructed about warning signs. Follow-up in 3 months.

## 2023-06-23 NOTE — Progress Notes (Signed)
 HPI: [name] is a 68 y.o. male with a PMHx significant for HTN, colon cancer, DM II, insomnia, chronic low back pain, and B12 deficiency anemia, who is here today for hypertension follow up.  Last seen on 05/31/2023, when his BP was elevated, he was not take Amlodipine 10 mg at that time.He has been taken all his meds daily. Currently on amlodipine 10 mg daily, Hydrochlorothiazide 12.5 mg daily, and Olmesartan 20 mg daily.  BP readings at home: His BP readings have still been elevated since his last visit but are slowly improving. BP went from 179/112 on 05/27/23 and 182/116 on 06/01/23 to 136-151/92-110. Side effects: none Vision: He has not had an eye appointment yet.  Negative for unusual or severe headache, visual changes, exertional chest pain, dyspnea, palpitations, focal weakness, or edema.  In general he feels "fine."  Lab Results  Component Value Date   CREATININE 1.21 05/19/2023   BUN 14 05/19/2023   NA 137 05/19/2023   K 3.6 05/19/2023   CL 100 05/19/2023   CO2 31 05/19/2023   DM II: Currently on Farxiga 10 mg daily.  He has seen nutritionist since his last visit.  No new problems since his last OV. He has no new concerns.  He had abdominal/pelvic/chest CT on 06/21/23 and would like to go through results. Hx of sigmoid colon cancer, S/P laparoscopic partial colectomy,small bowel resection on 06/20/20.  He follows with oncologist.  Also seen on CT : Similar diffuse decreased hepatic density with geographic areas of hyperintensity for instance in the dome of the liver on image 49/2. No suspicious hepatic lesion. Gallbladder is unremarkable. No biliary ductal dilation. Lab Results  Component Value Date   ALT 17 03/30/2023   AST 16 03/30/2023   ALKPHOS 46 03/30/2023   BILITOT 0.5 03/30/2023   Review of Systems  Constitutional:  Negative for activity change, appetite change, chills and fever.  Respiratory:  Negative for cough and wheezing.    Gastrointestinal:  Negative for abdominal pain, nausea and vomiting.  Genitourinary:  Negative for decreased urine volume, dysuria and hematuria.  Neurological:  Negative for syncope and facial asymmetry.  See other pertinent positives and negatives in HPI.  Current Outpatient Medications on File Prior to Visit  Medication Sig Dispense Refill   Blood Glucose Monitoring Suppl (ACCU-CHEK AVIVA PLUS) w/Device KIT As directed. 1 kit 0   dapagliflozin propanediol (FARXIGA) 10 MG TABS tablet Take 1 tablet (10 mg total) by mouth daily before breakfast. 30 tablet 3   Glucose Blood (BLOOD GLUCOSE TEST STRIPS 333) STRP Check blood sugar daily. 100 strip 2   hydrochlorothiazide (HYDRODIURIL) 12.5 MG tablet Take 1 tablet (12.5 mg total) by mouth daily. 90 tablet 1   rosuvastatin (CRESTOR) 10 MG tablet Take 1 tablet (10 mg total) by mouth daily. 30 tablet 3   [DISCONTINUED] cloNIDine (CATAPRES) 0.1 MG tablet Take 0.1 mg by mouth 2 (two) times daily.      No current facility-administered medications on file prior to visit.   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  Tobacco Use   Smoking status: Former    Current packs/day: 0.00    Average packs/day: 0.5 packs/day for 4.0 years (2.0 ttl pk-yrs)    Types: Cigarettes    Start date: 04/27/1982    Quit date: 04/27/1986    Years since quitting: 37.1   Smokeless tobacco: Never   Vaping Use   Vaping status: Never Used  Substance and Sexual Activity   Alcohol use: Yes    Comment: drinks alcohol 2 days per week    Drug use: No   Sexual activity: Yes  Other Topics Concern   Not on file  Social History Narrative   Not on file   Social Drivers of Health   Financial Resource Strain: Low Risk  (10/05/2022)   Overall Financial Resource Strain (CARDIA)    Difficulty of Paying Living Expenses: Not hard at all  Food Insecurity: No Food Insecurity (10/05/2022)   Hunger Vital Sign    Worried About Running Out of Food in the Last Year: Never true    Ran Out of Food in the Last Year: Never true  Transportation Needs: No Transportation Needs (10/05/2022)   PRAPARE - Administrator, Civil Service (Medical): No    Lack of Transportation (Non-Medical): No  Physical Activity: Inactive (10/05/2022)   Exercise Vital Sign    Days of Exercise per Week: 0 days    Minutes of Exercise per Session: 0 min  Stress: No Stress Concern Present (10/05/2022)   [employer] of Occupational Health - Occupational Stress Questionnaire    Feeling of Stress : Not at all  Social Connections: Socially Isolated (10/05/2022)   Social Connection and Isolation Panel [NHANES]    Frequency of Communication with Friends and Family: More than three times a week    Frequency of Social Gatherings with Friends and Family: Twice a week    Attends Religious Services: Never    Database administrator or Organizations: No    Attends Banker Meetings: Never    Marital Status: Separated   Vitals:   06/23/23 0930  BP: 134/86  Pulse: 82  Resp: 16  Temp: 98.6 F (37 C)  SpO2: 96%   Body mass index is 43.13 kg/m.  Physical Exam Vitals and nursing note reviewed.  Constitutional:      General: He is not in acute distress.    Appearance: He is well-developed.  HENT:     Head: Normocephalic and atraumatic.     Mouth/Throat:     Mouth: Mucous membranes are moist.      Dentition: Has dentures.     Pharynx: Oropharynx is clear.  Eyes:     Conjunctiva/sclera: Conjunctivae normal.  Cardiovascular:     Rate and Rhythm: Normal rate and regular rhythm.     Heart sounds: No murmur heard. Pulmonary:     Effort: Pulmonary effort is normal. No respiratory distress.     Breath sounds: Normal breath sounds.  Abdominal:     Palpations: Abdomen is soft. There is no hepatomegaly or mass.     Tenderness: There is no abdominal tenderness.  Musculoskeletal:     Right lower leg: No edema.  Left lower leg: No edema.  Skin:    General: Skin is warm.     Findings: No erythema or rash.  Neurological:     Mental Status: He is alert and oriented to person, place, and time.     Cranial Nerves: No cranial nerve deficit.     Gait: Gait normal.  Psychiatric:        Mood and Affect: Mood and affect normal.   ASSESSMENT AND PLAN:  Mr. Gitlin was seen today for hypertension follow up.   Essential (primary) hypertension Assessment & Plan: BP is not well controlled. Possible complications of elevated BP discussed. Changes today:Increased dose of Olmesartan from 20 mg to 40 mg, no changes in Amlodipine and hydrochlorothiazide same dose. Changed to combination tab Amlodipine-Olmesartan 10-40 mg daily. Low salt diet. Continue monitoring BP at home. Overdue for eye exam. Instructed about warning signs. Follow-up in 3 months.  Orders: -     amLODIPine-Olmesartan; Take 1 tablet by mouth daily.  Dispense: 90 tablet; Refill: 2  Cancer of sigmoid colon (HCC) We reviewed abdominal/pelvic/chest CT. Continue following with oncologist.  Hepatic steatosis  Seen on abdominal CT 06/21/23. Stressed the importance of wt loss. Continue low fat and carbs diet. LFT's has been in normal range. I spent a total of 32 minutes in both face to face and non face to face activities for this visit on the date of this encounter. During this time history was obtained and documented, examination  was performed, prior labs/imaging reviewed, and assessment/plan discussed.  Return in about 3 months (around 09/22/2023) for chronic problems.  I, Rolla Etienne Wierda, acting as a scribe for Eric Kofoed [name], MD., have documented all relevant documentation on the behalf of Eric Kozuch [name], MD, as directed by  Eric Pletz [name], MD while in the presence of Eric Cregg [name], MD.   I, Eric Lamba [name], MD, have reviewed all documentation for this visit. The documentation on 06/23/23 for the exam, diagnosis, procedures, and orders are all accurate and complete.  Eric Macdonald G. [name], MD  [facility]. [place] office.

## 2023-06-26 ENCOUNTER — Encounter: Payer: Self-pay | Admitting: Nurse Practitioner

## 2023-07-01 ENCOUNTER — Telehealth: Payer: Self-pay | Admitting: Hematology

## 2023-07-03 NOTE — Assessment & Plan Note (Signed)
 moderately differentiated G2, pT3pN0M0 stage IIA, MMR normal, MSI-stable -He presented with 2-6 months bloody diarrhea, fatigue, and 20 lbs weight loss. 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 Baseline CEA mildly elevated 6.1, staging work up negative for metastatic disease.  -S/p partial colectomy 06/20/20 by [name], surgical path showed 12.7 cm carcinoma was removed, 0/27 LNs+, margins clear. There were no high risk features such as perforation, PNI, or LVI. -Given stage II colon cancer, adjuvant chemotherapy is not recommended. -The recurrence risk is moderate.   -ctDNA not detected (07/21/2020, 08/25/2020, 10/27/2020, 01/26/2021 and 05/25/2021, 02/2022, 08/2022) -Surveillance colonoscopy 11/2021 ([name] at [facility]) was negative, 3-year recall -Surveillance CT 48/2025 NED

## 2023-07-04 ENCOUNTER — Inpatient Hospital Stay

## 2023-07-04 ENCOUNTER — Inpatient Hospital Stay: Attending: Hematology

## 2023-07-04 ENCOUNTER — Inpatient Hospital Stay (HOSPITAL_BASED_OUTPATIENT_CLINIC_OR_DEPARTMENT_OTHER): Admitting: Hematology

## 2023-07-04 ENCOUNTER — Encounter: Payer: Self-pay | Admitting: Hematology

## 2023-07-04 VITALS — BP 142/92 | HR 100 | Temp 98.5°F | Resp 22 | Ht 71.5 in | Wt 317.2 lb

## 2023-07-04 DIAGNOSIS — E538 Deficiency of other specified B group vitamins: Secondary | ICD-10-CM | POA: Diagnosis present

## 2023-07-04 DIAGNOSIS — C187 Malignant neoplasm of sigmoid colon: Secondary | ICD-10-CM | POA: Diagnosis not present

## 2023-07-04 DIAGNOSIS — Z85038 Personal history of other malignant neoplasm of large intestine: Secondary | ICD-10-CM | POA: Diagnosis not present

## 2023-07-04 DIAGNOSIS — D519 Vitamin B12 deficiency anemia, unspecified: Secondary | ICD-10-CM

## 2023-07-04 LAB — CBC WITH DIFFERENTIAL (CANCER CENTER ONLY)
Abs Immature Granulocytes: 0.01 10*3/uL (ref 0.00–0.07)
Basophils Absolute: 0 10*3/uL (ref 0.0–0.1)
Basophils Relative: 1 %
Eosinophils Absolute: 0.1 10*3/uL (ref 0.0–0.5)
Eosinophils Relative: 2 %
HCT: 43.8 % (ref 39.0–52.0)
Hemoglobin: 14.8 g/dL (ref 13.0–17.0)
Immature Granulocytes: 0 %
Lymphocytes Relative: 33 %
Lymphs Abs: 1.9 10*3/uL (ref 0.7–4.0)
MCH: 25.6 pg — ABNORMAL LOW (ref 26.0–34.0)
MCHC: 33.8 g/dL (ref 30.0–36.0)
MCV: 75.6 fL — ABNORMAL LOW (ref 80.0–100.0)
Monocytes Absolute: 0.6 10*3/uL (ref 0.1–1.0)
Monocytes Relative: 10 %
Neutro Abs: 3.2 10*3/uL (ref 1.7–7.7)
Neutrophils Relative %: 54 %
Platelet Count: 260 10*3/uL (ref 150–400)
RBC: 5.79 MIL/uL (ref 4.22–5.81)
RDW: 18.2 % — ABNORMAL HIGH (ref 11.5–15.5)
WBC Count: 5.8 10*3/uL (ref 4.0–10.5)
nRBC: 0 % (ref 0.0–0.2)

## 2023-07-04 LAB — CMP (CANCER CENTER ONLY)
ALT: 16 U/L (ref 0–44)
AST: 16 U/L (ref 15–41)
Albumin: 4 g/dL (ref 3.5–5.0)
Alkaline Phosphatase: 42 U/L (ref 38–126)
Anion gap: 6 (ref 5–15)
BUN: 17 mg/dL (ref 8–23)
CO2: 31 mmol/L (ref 22–32)
Calcium: 9.4 mg/dL (ref 8.9–10.3)
Chloride: 102 mmol/L (ref 98–111)
Creatinine: 1.48 mg/dL — ABNORMAL HIGH (ref 0.61–1.24)
GFR, Estimated: 51 mL/min — ABNORMAL LOW (ref >60)
Glucose, Bld: 136 mg/dL — ABNORMAL HIGH (ref 70–99)
Potassium: 3.6 mmol/L (ref 3.5–5.1)
Sodium: 139 mmol/L (ref 135–145)
Total Bilirubin: 0.4 mg/dL (ref 0.0–1.2)
Total Protein: 7.6 g/dL (ref 6.5–8.1)

## 2023-07-04 LAB — VITAMIN B12: Vitamin B-12: 330 pg/mL (ref 180–914)

## 2023-07-04 MED ORDER — VITAMIN B-12 1000 MCG PO TABS: 1000.0000 ug | ORAL_TABLET | Freq: Every day | ORAL | 1 refills | Status: AC

## 2023-07-04 NOTE — Progress Notes (Signed)
 [facility]   [phone] [phone]   Clinic Follow up Note   Patient Care Team: Eric Macdonald, Eric G, MD as PCP - General (Family Medicine) Eric Clifton, NP as Nurse Practitioner (Oncology) Eric Selma, MD as Consulting Physician (Hematology and Oncology) Eric Nixon, MD as Consulting Physician (General Surgery) Eric Kerns, MD as Referring Physician (Gastroenterology)  Date of Service:  07/04/2023  CHIEF COMPLAINT: f/u of colon cancer and B12 deficiency  CURRENT THERAPY:  Cancer surveillance  Oncology History   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 Baseline CEA mildly elevated 6.1, staging work up negative for metastatic disease.  -S/p partial colectomy 06/20/20 by [name], surgical path showed 12.7 cm carcinoma was removed, 0/27 LNs+, margins clear. There were no high risk features such as perforation, PNI, or LVI. -Given stage II colon cancer, adjuvant chemotherapy is not recommended. -The recurrence risk is moderate.   -ctDNA not detected (07/21/2020, 08/25/2020, 10/27/2020, 01/26/2021 and 05/25/2021, 02/2022, 08/2022) -Surveillance colonoscopy 11/2021 ([name] at [facility]) was negative, 3-year recall -Surveillance CT 48/2025 NED   Assessment & Plan Colon cancer No signs of recurrence on recent CT scan. It has been three years since the initial diagnosis, and he is doing well from a cancer standpoint. - Discontinue routine CT scans. - Schedule follow-up in six months with nurse practitioner.  Vitamin B12 deficiency B12 level was previously low, and he has not had an injection since January 15. He prefers to switch to oral B12 supplementation. - Prescribe oral B12 tablets, 1000 mcg daily, to be picked up at [facility] - Discontinue B12  injections unless oral supplementation is ineffective. - Perform lab work a week before the next appointment to assess B12 levels.  Essential hypertension Blood pressure was previously uncontrolled due to misunderstanding with medication regimen. The issue has been resolved, and blood pressure is improving.  Fatty liver Fatty liver noted on recent CT scan. He is overweight, which may contribute to the condition.  Overweight He is overweight, which may exacerbate knee pain and contribute to fatty liver. He has been advised by a dietitian and orthopedic doctor to lose weight.  Plan - Lab and CT scan reviewed, NED - Patient wants to try oral B12, I called in prescription to his pharmacy - Cancel B12 injection today - Lab and follow-up in 6 months   SUMMARY OF ONCOLOGIC HISTORY: Oncology History Overview Note   Cancer Staging  Cancer of sigmoid colon [facility]) Staging form: Colon and Rectum, AJCC 8th Edition - Pathologic stage from 06/20/2020: Stage IIA (pT3, pN0, cM0) - Signed by [name], [name], NP on 07/21/2020    Cancer of sigmoid colon (HCC)  05/15/2020 Procedure   Colonoscopy by Dr. Marven Slimmer Macdonald: obstructing mass found in the sigmoid colon located 35 cm from the anus.  Unable to advance past the mass.  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  No metastatic disease in the chest, abdomen, pelvis   06/12/2020 Tumor  Marker   Baseline CEA 6.1   06/20/2020 Initial Diagnosis   Cancer of sigmoid colon (HCC)   06/20/2020 Cancer Staging   Staging form: Colon and Rectum, AJCC 8th Edition -  Pathologic stage from 06/20/2020: Stage IIA (pT3, pN0, cM0) - Signed by Eric Clifton, NP on 07/21/2020 Stage prefix: Initial diagnosis Total positive nodes: 0 Histologic grading system: 4 grade system Histologic grade (Macdonald): G2   06/20/2020 Definitive Surgery   Laparoscopic partial colectomy, small bowel resection by [name]   06/20/2020 Pathology Results   FINAL MICROSCOPIC DIAGNOSIS:   A. COLON, SIGMOID AND ATTACHED SMALL BOWEL, RESECTION:  -  Adenocarcinoma, moderately differentiated, 12.7 cm  -  No carcinoma identified in twenty-four lymph nodes (0/24)  -  Margins uninvolved by carcinoma (0.2 cm; mesenteric margin)  -  Sessile serrated polyp  -  See oncology table and comment below   B. COLON, ADDITIONAL PROXIMAL MARGIN, RESECTION:  -  No residual adenocarcinoma identified  -  No carcinoma identified in three lymph nodes (0/3)   MMR normal, MSI-stable   05/22/2021 Imaging   EXAM: CT CHEST, ABDOMEN, AND PELVIS WITH CONTRAST  IMPRESSION: 1. Prior partial sigmoidectomy with anastomotic sutures in the superior pelvis, without evidence of local recurrence/residual disease. 2. No convincing evidence of metastatic disease in the chest, abdomen or pelvis. 3. Prominent left common iliac lymph node measures 8 mm, is nonspecific. Attention on follow-up imaging suggested. 4. Generalized decrease in hepatic parenchymal attenuation with focal geographic areas of relative hyperattenuation, almost certainly reflects diffuse hepatic steatosis with focal fatty sparing. This could be more definitively characterized with hepatic protocol abdominal MRI with and without contrast if clinically indicated. 5.  Aortic Atherosclerosis (ICD10-I70.0).      Discussed the use of AI scribe software for clinical note transcription with the patient, who gave verbal consent to proceed.  History of Present Illness The patient, a 68 year old gentleman with a history of colon cancer and B12 deficiency,  presents for a routine follow-up. He reports considering another knee replacement due to increasing discomfort from a 'bone on bone' condition. He previously had a successful knee replacement on the other side, but expresses concerns about the recovery process due to changes in his support system. He is hesitant about the potential need for pain medication post-surgery, expressing a strong aversion to narcotics.  The patient also mentions a recent issue with blood pressure management due to a misunderstanding with medication changes. He stopped taking amlodipine , which led to an increase in blood pressure. However, this issue has been resolved, and his blood pressure is now under control.  The patient also discusses his personal life, expressing a desire for companionship and the potential benefits of having a partner, particularly in the context of his health and recovery from potential knee surgery.     All other systems were reviewed with the patient and are negative.  MEDICAL HISTORY:  Past Medical History:  Diagnosis Date   Arthritis    Cancer (HCC)    colon   Chronic low back pain    Is supposed to have surgery soon at [facility]   Diabetes mellitus without complication (HCC)    Dx'ed 09/01/22 with HgA1C 6.9   Hypertension    Knee pain     SURGICAL HISTORY: Past Surgical History:  Procedure Laterality Date   JOINT REPLACEMENT     knee 05/2011   KNEE SURGERY  08   rt arthroscopy   LAPAROSCOPIC PARTIAL COLECTOMY N/A 06/20/2020   Procedure:  LAPAROSCOPIC PARTIAL COLECTOMY;  Surgeon: Eric Nixon, MD;  Location: [facility] ORS;  Service: General;  Laterality: N/A;   TOTAL KNEE ARTHROPLASTY  05/05/2011   Procedure: TOTAL KNEE ARTHROPLASTY;  Surgeon: Ferd Householder, MD;  Location: [facility] OR;  Service: Orthopedics;  Laterality: Right;  DR MURPHY WANTS 90 MINUTES FOR THIS CASE    I have reviewed the social history and family history with the patient and they are unchanged from previous  note.  ALLERGIES:  is allergic to other.  MEDICATIONS:  Current Outpatient Medications  Medication Sig Dispense Refill   amLODipine -olmesartan  (AZOR ) 10-40 MG tablet Take 1 tablet by mouth daily. 90 tablet 2   Blood Glucose Monitoring Suppl (ACCU-CHEK AVIVA PLUS) w/Device KIT As directed. 1 kit 0   cyanocobalamin  (VITAMIN B12) 1000 MCG tablet Take 1 tablet (1,000 mcg total) by mouth daily. 90 tablet 1   dapagliflozin  propanediol (FARXIGA ) 10 MG TABS tablet Take 1 tablet (10 mg total) by mouth daily before breakfast. 30 tablet 3   Glucose Blood (BLOOD GLUCOSE TEST STRIPS 333) STRP Check blood sugar daily. 100 strip 2   hydrochlorothiazide  (HYDRODIURIL ) 12.5 MG tablet Take 1 tablet (12.5 mg total) by mouth daily. 90 tablet 1   rosuvastatin  (CRESTOR ) 10 MG tablet Take 1 tablet (10 mg total) by mouth daily. 30 tablet 3   No current facility-administered medications for this visit.    PHYSICAL EXAMINATION: ECOG PERFORMANCE STATUS: 1 - Symptomatic but completely ambulatory  Vitals:   07/04/23 1320  BP: (!) 142/92  Pulse: 100  Resp: (!) 22  Temp: 98.5 F (36.9 C)  SpO2: 98%   Wt Readings from Last 3 Encounters:  07/04/23 (!) 317 lb 3.2 oz (143.9 kg)  06/23/23 (!) 313 lb 9.6 oz (142.2 kg)  06/15/23 (!) 311 lb 12.8 oz (141.4 kg)     GENERAL:alert, no distress and comfortable SKIN: skin color, texture, turgor are normal, no rashes or significant lesions EYES: normal, Conjunctiva are pink and non-injected, sclera clear Musculoskeletal:no cyanosis of digits and no clubbing  NEURO: alert & oriented x 3 with fluent speech, no focal motor/sensory deficits  Physical Exam   LABORATORY DATA:  I have reviewed the data as listed    Latest Ref Rng & Units 07/04/2023   12:57 PM 03/30/2023    1:02 PM 09/29/2022   10:02 AM  CBC  WBC 4.0 - 10.5 K/uL 5.8  6.2  6.7   Hemoglobin 13.0 - 17.0 Macdonald/dL 14.7  82.9  56.2   Hematocrit 39.0 - 52.0 % 43.8  46.1  44.1   Platelets 150 - 400 K/uL 260  324   306         Latest Ref Rng & Units 07/04/2023   12:57 PM 05/19/2023    9:20 AM 04/13/2023   10:51 AM  CMP  Glucose 70 - 99 mg/dL 130  90  865   BUN 8 - 23 mg/dL 17  14  14    Creatinine 0.61 - 1.24 mg/dL 7.84  6.96  2.95   Sodium 135 - 145 mmol/L 139  137  138   Potassium 3.5 - 5.1 mmol/L 3.6  3.6  3.3   Chloride 98 - 111 mmol/L 102  100  99   CO2 22 - 32 mmol/L 31  31  32   Calcium  8.9 - 10.3 mg/dL 9.4  9.7  9.4   Total Protein 6.5 - 8.1 Macdonald/dL 7.6     Total Bilirubin 0.0 - 1.2 mg/dL 0.4  Alkaline Phos 38 - 126 U/L 42     AST 15 - 41 U/L 16     ALT 0 - 44 U/L 16         RADIOGRAPHIC STUDIES: I have personally reviewed the radiological images as listed and agreed with the findings in the report. No results found.    No orders of the defined types were placed in this encounter.  All questions were answered. The patient knows to call the clinic with any problems, questions or concerns. No barriers to learning was detected. The total time spent in the appointment was 25 minutes.     Eric St. Robert, MD 07/04/2023

## 2023-07-07 ENCOUNTER — Encounter: Payer: Self-pay | Admitting: Nurse Practitioner

## 2023-07-11 ENCOUNTER — Other Ambulatory Visit: Payer: Self-pay

## 2023-07-12 ENCOUNTER — Inpatient Hospital Stay

## 2023-07-12 DIAGNOSIS — D519 Vitamin B12 deficiency anemia, unspecified: Secondary | ICD-10-CM

## 2023-07-12 DIAGNOSIS — I700 Atherosclerosis of aorta: Secondary | ICD-10-CM

## 2023-07-12 DIAGNOSIS — E538 Deficiency of other specified B group vitamins: Secondary | ICD-10-CM | POA: Diagnosis not present

## 2023-07-12 MED ORDER — CYANOCOBALAMIN 1000 MCG/ML IJ SOLN
1000.0000 ug | Freq: Once | INTRAMUSCULAR | Status: AC
  Administered 2023-07-12: 1000 ug via INTRAMUSCULAR
  Filled 2023-07-12: qty 1

## 2023-07-20 ENCOUNTER — Other Ambulatory Visit: Payer: Self-pay | Admitting: Family Medicine

## 2023-07-20 MED ORDER — ROSUVASTATIN CALCIUM 10 MG PO TABS: 10.0000 mg | ORAL_TABLET | Freq: Every day | ORAL | 2 refills | Status: DC

## 2023-07-20 NOTE — Telephone Encounter (Signed)
 Copied from CRM [id number]. Topic: Clinical - Medication Refill >> Jul 20, 2023  9:35 AM Clydene Darner H wrote: Medication:rosuvastatin  (CRESTOR )  Has the patient contacted their pharmacy? Yes Patient stated he contacted the pharmacy regarding a prescription refill but was unable to reach anyone.  (Agent: If no, request that the patient contact the pharmacy for the refill. If patient does not wish to contact the pharmacy document the reason why and proceed with request.) (Agent: If yes, when and what did the pharmacy advise?)  This is the patient's preferred pharmacy:  [id number] - [place], [place] - [address] AT [facility] [address] [place] [address] Phone: [phone] Fax: [phone]  Is this the correct pharmacy for this prescription? Yes If no, delete pharmacy and type the correct one.   Has the prescription been filled recently? No  Is the patient out of the medication? Yes  Has the patient been seen for an appointment in the last year OR does the patient have an upcoming appointment? Yes last seen on 05/31/23  Can we respond through MyChart? No  Agent: Please be advised that Rx refills may take up to 3 business days. We ask that you follow-up with your pharmacy.

## 2023-08-09 ENCOUNTER — Encounter: Payer: Self-pay | Admitting: Nurse Practitioner

## 2023-08-09 ENCOUNTER — Inpatient Hospital Stay: Attending: Hematology

## 2023-08-09 DIAGNOSIS — I700 Atherosclerosis of aorta: Secondary | ICD-10-CM

## 2023-08-09 DIAGNOSIS — D519 Vitamin B12 deficiency anemia, unspecified: Secondary | ICD-10-CM

## 2023-08-09 DIAGNOSIS — E538 Deficiency of other specified B group vitamins: Secondary | ICD-10-CM | POA: Insufficient documentation

## 2023-08-09 MED ORDER — CYANOCOBALAMIN 1000 MCG/ML IJ SOLN
1000.0000 ug | Freq: Once | INTRAMUSCULAR | Status: AC
  Administered 2023-08-09: 1000 ug via INTRAMUSCULAR
  Filled 2023-08-09: qty 1

## 2023-08-30 ENCOUNTER — Encounter: Payer: Self-pay | Admitting: Nurse Practitioner

## 2023-09-06 ENCOUNTER — Inpatient Hospital Stay: Attending: Hematology

## 2023-09-06 DIAGNOSIS — E538 Deficiency of other specified B group vitamins: Secondary | ICD-10-CM | POA: Diagnosis present

## 2023-09-06 DIAGNOSIS — I700 Atherosclerosis of aorta: Secondary | ICD-10-CM

## 2023-09-06 DIAGNOSIS — D519 Vitamin B12 deficiency anemia, unspecified: Secondary | ICD-10-CM

## 2023-09-06 MED ORDER — CYANOCOBALAMIN 1000 MCG/ML IJ SOLN
1000.0000 ug | Freq: Once | INTRAMUSCULAR | Status: AC
  Administered 2023-09-06: 1000 ug via INTRAMUSCULAR
  Filled 2023-09-06: qty 1

## 2023-09-09 ENCOUNTER — Other Ambulatory Visit: Payer: Self-pay

## 2023-09-09 ENCOUNTER — Encounter: Payer: Self-pay | Admitting: Nurse Practitioner

## 2023-09-14 ENCOUNTER — Encounter: Attending: Family Medicine | Admitting: Dietician

## 2023-09-14 ENCOUNTER — Encounter: Payer: Self-pay | Admitting: Dietician

## 2023-09-14 VITALS — Wt 323.5 lb

## 2023-09-14 DIAGNOSIS — E1169 Type 2 diabetes mellitus with other specified complication: Secondary | ICD-10-CM | POA: Diagnosis present

## 2023-09-14 NOTE — Patient Instructions (Signed)
 New Goals (established by patient):   Goal: go swimming at your sons pool or aquatic center once a week.   Assessment of Previous Goals:   Goal: Go walking 3 times per week.    Goal: Drink 2 1/2 of your cups of water daily.    Goal: try to use 1 spoon of sugar in your coffee instead of 2. - goal not met, continue.

## 2023-09-14 NOTE — Progress Notes (Signed)
 Diabetes Self-Management Education  Visit Type: Follow-up  Appt. Start Time: 1515 Appt. End Time: 1550  09/14/2023  [name], identified by name and date of birth, is a 68 y.o. male with a diagnosis of Diabetes: type 2 .   ASSESSMENT  History includes: type 2 diabetes, arthritis, cancer, HTN Labs noted: 04/13/23 A1c 7.2% Medications include: reviewed, farxiga  Supplements: goes to cancer center once a month for vitamin B12 injection   Weight History 09/14/2023: 323.5 lb 06/15/2023: 311.8 lb 03/17/2023: 313 lb 11/18/2022: 319 lb   Pt reports his kids came to visit for 2 weeks.  Pt states his son has pool down the road from him and he thinks swimming would be better for leg, because when he goes walking for too long he gets leg pain. Pt reports he used to swim 30 laps. Pt states he can go to the aquatic center for free too and wants to take his cousin with him.   Pt reports he has been going through a lot which has impacted his habits he has been working toward.   Pt states his blood pressure was up and down for a while but has been good recently. Pt states he checks it every morning and it was 120/80. Pt states he tried to reduce salt.   Assessment of Previous Goals:   Goal: Go walking 3 times per week. - goal not met, continue.    Goal: Drink 2 1/2 of your cups of water daily. - goal not assessed. continue   Goal: try to use 1 spoon of sugar in your coffee instead of 2. - goal not met, continue.   Weight (!) 323 lb 8 oz (146.7 kg). Body mass index is 44.49 kg/m.   Diabetes Self-Management Education - 09/14/23 1515       Visit Information   Visit Type Follow-up      Health Coping   How would you rate your overall health? Good      Psychosocial Assessment   Patient Belief/Attitude about Diabetes Motivated to manage diabetes    What is the hardest part about your diabetes right now, causing you the most concern, or is the most worrisome to you about your  diabetes?   Making healty food and beverage choices    Self-care barriers None    Self-management support Doctor's office    Other persons present Patient    Patient Concerns Nutrition/Meal planning    Special Needs None    Preferred Learning Style No preference indicated    Learning Readiness Ready      Pre-Education Assessment   Patient understands the diabetes disease and treatment process. Needs Review    Patient understands incorporating nutritional management into lifestyle. Needs Review    Patient undertands incorporating physical activity into lifestyle. Needs Review    Patient understands using medications safely. Needs Review    Patient understands monitoring blood glucose, interpreting and using results Needs Review    Patient understands prevention, detection, and treatment of acute complications. Needs Review    Patient understands prevention, detection, and treatment of chronic complications. Needs Review    Patient understands how to develop strategies to address psychosocial issues. Needs Review    Patient understands how to develop strategies to promote health/change behavior. Needs Review      Complications   Last HgB A1C per patient/outside source 7.2 %      Activity / Exercise   Activity / Exercise Type ADL's      Patient Education  Previous Diabetes Education Yes (please comment)    Disease Pathophysiology Explored patient's options for treatment of their diabetes    Healthy Eating Role of diet in the treatment of diabetes and the relationship between the three main macronutrients and blood glucose level;Plate Method;Meal options for control of blood glucose level and chronic complications.;Meal timing in regards to the patients' current diabetes medication.    Being Active Role of exercise on diabetes management, blood pressure control and cardiac health.    Medications Reviewed patients medication for diabetes, action, purpose, timing of dose and side effects.     Chronic complications Relationship between chronic complications and blood glucose control;Identified and discussed with patient  current chronic complications    Diabetes Stress and Support Identified and addressed patients feelings and concerns about diabetes    Lifestyle and Health Coping Lifestyle issues that need to be addressed for better diabetes care      Individualized Goals (developed by patient)   Nutrition General guidelines for healthy choices and portions discussed    Physical Activity Exercise 3-5 times per week;30 minutes per day    Medications take my medication as prescribed    Monitoring  Test my blood glucose as discussed    Problem Solving Eating Pattern    Reducing Risk examine blood glucose patterns;do foot checks daily;treat hypoglycemia with 15 grams of carbs if blood glucose less than 70mg /dL    Health Coping Ask for help with psychological, social, or emotional issues      Patient Self-Evaluation of Goals - Patient rates self as meeting previously set goals (% of time)   Nutrition 50 - 75 % (half of the time)    Physical Activity < 25% (hardly ever/never)    Medications >75% (most of the time)    Monitoring 25 - 50% (sometimes)    Problem Solving and behavior change strategies  50 - 75 % (half of the time)    Reducing Risk (treating acute and chronic complications) 50 - 75 % (half of the time)    Health Coping 50 - 75 % (half of the time)      Post-Education Assessment   Patient understands the diabetes disease and treatment process. Comprehends key points    Patient understands incorporating nutritional management into lifestyle. Comprehends key points    Patient undertands incorporating physical activity into lifestyle. Comprehends key points    Patient understands using medications safely. Comphrehends key points    Patient understands monitoring blood glucose, interpreting and using results Comprehends key points    Patient understands prevention,  detection, and treatment of acute complications. Comprehends key points    Patient understands prevention, detection, and treatment of chronic complications. Comprehends key points    Patient understands how to develop strategies to address psychosocial issues. Comprehends key points    Patient understands how to develop strategies to promote health/change behavior. Comprehends key points      Outcomes   Expected Outcomes Demonstrated interest in learning. Expect positive outcomes    Future DMSE 4-6 wks    Program Status Not Completed      Subsequent Visit   Since your last visit have you continued or begun to take your medications as prescribed? Yes          Individualized Plan for Diabetes Self-Management Training:   Learning Objective:  Patient will have a greater understanding of diabetes self-management. Patient education plan is to attend individual and/or group sessions per assessed needs and concerns.   Plan:  Patient Instructions  New Goals (established by patient):   Goal: go swimming at your sons pool or aquatic center once a week.   Assessment of Previous Goals:   Goal: Go walking 3 times per week.    Goal: Drink 2 1/2 of your cups of water daily.    Goal: try to use 1 spoon of sugar in your coffee instead of 2. - goal not met, continue.   Expected Outcomes:  Demonstrated interest in learning. Expect positive outcomes  Education material provided: no handouts today  If problems or questions, patient to contact team via:  Phone  Future DSME appointment: 4-6 wks

## 2023-09-27 ENCOUNTER — Other Ambulatory Visit: Payer: Self-pay

## 2023-09-27 ENCOUNTER — Encounter: Payer: Self-pay | Admitting: Nurse Practitioner

## 2023-10-04 ENCOUNTER — Other Ambulatory Visit: Payer: Self-pay

## 2023-10-04 ENCOUNTER — Inpatient Hospital Stay

## 2023-10-04 ENCOUNTER — Inpatient Hospital Stay: Attending: Hematology

## 2023-10-04 DIAGNOSIS — C187 Malignant neoplasm of sigmoid colon: Secondary | ICD-10-CM

## 2023-10-04 DIAGNOSIS — D519 Vitamin B12 deficiency anemia, unspecified: Secondary | ICD-10-CM

## 2023-10-04 DIAGNOSIS — E538 Deficiency of other specified B group vitamins: Secondary | ICD-10-CM | POA: Diagnosis present

## 2023-10-04 DIAGNOSIS — I700 Atherosclerosis of aorta: Secondary | ICD-10-CM

## 2023-10-04 MED ORDER — CYANOCOBALAMIN 1000 MCG/ML IJ SOLN
1000.0000 ug | Freq: Once | INTRAMUSCULAR | Status: AC
  Administered 2023-10-04: 1000 ug via INTRAMUSCULAR
  Filled 2023-10-04: qty 1

## 2023-10-10 ENCOUNTER — Other Ambulatory Visit: Payer: Self-pay

## 2023-10-11 ENCOUNTER — Telehealth: Payer: Self-pay

## 2023-10-11 ENCOUNTER — Encounter: Payer: Self-pay | Admitting: Hematology

## 2023-10-11 ENCOUNTER — Other Ambulatory Visit: Payer: Self-pay

## 2023-10-11 NOTE — Telephone Encounter (Signed)
 Contacted patient via telephone call. Let patient know Guardant Reveal Test Result: NEGATIVE. Patient verbalized understanding.

## 2023-10-12 LAB — GUARDANT REVEAL

## 2023-10-14 ENCOUNTER — Other Ambulatory Visit: Payer: Self-pay

## 2023-11-01 ENCOUNTER — Other Ambulatory Visit: Payer: Self-pay

## 2023-11-01 ENCOUNTER — Inpatient Hospital Stay: Attending: Hematology

## 2023-11-01 ENCOUNTER — Encounter: Attending: Family Medicine | Admitting: Dietician

## 2023-11-01 ENCOUNTER — Encounter: Payer: Self-pay | Admitting: Dietician

## 2023-11-01 VITALS — Wt 326.8 lb

## 2023-11-01 DIAGNOSIS — I700 Atherosclerosis of aorta: Secondary | ICD-10-CM

## 2023-11-01 DIAGNOSIS — E1169 Type 2 diabetes mellitus with other specified complication: Secondary | ICD-10-CM

## 2023-11-01 DIAGNOSIS — E538 Deficiency of other specified B group vitamins: Secondary | ICD-10-CM | POA: Insufficient documentation

## 2023-11-01 DIAGNOSIS — D519 Vitamin B12 deficiency anemia, unspecified: Secondary | ICD-10-CM

## 2023-11-01 MED ORDER — CYANOCOBALAMIN 1000 MCG/ML IJ SOLN
1000.0000 ug | Freq: Once | INTRAMUSCULAR | Status: AC
  Administered 2023-11-01: 1000 ug via INTRAMUSCULAR
  Filled 2023-11-01: qty 1

## 2023-11-01 NOTE — Progress Notes (Signed)
 Diabetes Self-Management Education  Visit Type: Follow-up  Appt. Start Time: 1500 Appt. End Time: 1535  11/01/2023  Eric Macdonald, identified by name and date of birth, is a 68 y.o. male with a diagnosis of Diabetes: type 2 .   ASSESSMENT  History includes: type 2 diabetes, arthritis, cancer, HTN Labs noted: 04/13/23 A1c 7.2% Medications include: reviewed, farxiga  Supplements: goes to cancer center once a month for vitamin B12 injection   Weight History 11/01/2023: 326.8 lb 09/14/2023: 323.5 lb 06/15/2023: 311.8 lb 03/17/2023: 313 lb 11/18/2022: 319 lb   Pt reports he did not go swimming with his cousin and he has not been walking.   Pt states he got a [employer] membership and tried the stationary bike and did it for 10 minutes and found it to be enjoyable and also felt like his legs felt better afterward. Pt states it helped reduce pain.   Pt reports he has worked to reduce his dinner portions. Pt states he has not been getting cake at the [employer].   New Goal  Goal 1: do the stationary bike 3 times a week for 10-20 minutes.    Assessment of Previous Goals:    Goal: Go walking 3 times per week. - goal not met, changed to stationary bike.    Goal: Drink 2 1/2 of your cups of water daily. - goal not assessed. Continue!   Goal: try to use 1 spoon of sugar in your coffee instead of 2. - goal not met, continue.   Weight (!) 326 lb 12.8 oz (148.2 kg). Body mass index is 44.94 kg/m.   Diabetes Self-Management Education - 11/01/23 1457       Visit Information   Visit Type Follow-up      Health Coping   How would you rate your overall health? Good      Psychosocial Assessment   Patient Belief/Attitude about Diabetes Motivated to manage diabetes    What is the hardest part about your diabetes right now, causing you the most concern, or is the most worrisome to you about your diabetes?   Making healty food and beverage choices    Self-care barriers None     Self-management support Doctor's office    Other persons present Patient    Patient Concerns Nutrition/Meal planning    Special Needs None    Preferred Learning Style No preference indicated    Learning Readiness Ready      Pre-Education Assessment   Patient understands the diabetes disease and treatment process. Needs Review    Patient understands incorporating nutritional management into lifestyle. Needs Review    Patient undertands incorporating physical activity into lifestyle. Needs Review    Patient understands using medications safely. Needs Review    Patient understands monitoring blood glucose, interpreting and using results Needs Review    Patient understands prevention, detection, and treatment of acute complications. Needs Review    Patient understands prevention, detection, and treatment of chronic complications. Needs Review    Patient understands how to develop strategies to address psychosocial issues. Needs Review    Patient understands how to develop strategies to promote health/change behavior. Needs Review      Complications   Last HgB A1C per patient/outside source 7.2 %      Dietary Intake   Breakfast none    Snack (morning) none    Lunch fresh market: greek salad with grilled chicken    Snack (afternoon) none OR little debbie    Chartered loss adjuster,  broccoli, baked potato    Snack (evening) none    Beverage(s) coffee, 1 1/2 c water,      Activity / Exercise   Activity / Exercise Type ADL's      Patient Education   Previous Diabetes Education No    Disease Pathophysiology Explored patient's options for treatment of their diabetes    Healthy Eating Role of diet in the treatment of diabetes and the relationship between the three main macronutrients and blood glucose level;Plate Method;Meal options for control of blood glucose level and chronic complications.;Reviewed blood glucose goals for pre and post meals and how to evaluate the patients' food intake on their  blood glucose level.    Being Active Role of exercise on diabetes management, blood pressure control and cardiac health.;Helped patient identify appropriate exercises in relation to his/her diabetes, diabetes complications and other health issue.    Medications Reviewed patients medication for diabetes, action, purpose, timing of dose and side effects.    Monitoring Identified appropriate SMBG and/or A1C goals.    Chronic complications Relationship between chronic complications and blood glucose control;Identified and discussed with patient  current chronic complications    Diabetes Stress and Support Identified and addressed patients feelings and concerns about diabetes;Worked with patient to identify barriers to care and solutions    Lifestyle and Health Coping Lifestyle issues that need to be addressed for better diabetes care      Individualized Goals (developed by patient)   Nutrition General guidelines for healthy choices and portions discussed    Physical Activity Exercise 3-5 times per week;30 minutes per day    Medications take my medication as prescribed    Monitoring  Test my blood glucose as discussed    Problem Solving Eating Pattern    Reducing Risk examine blood glucose patterns;do foot checks daily;treat hypoglycemia with 15 grams of carbs if blood glucose less than 70mg /dL    Health Coping Ask for help with psychological, social, or emotional issues      Patient Self-Evaluation of Goals - Patient rates self as meeting previously set goals (% of time)   Nutrition 50 - 75 % (half of the time)    Physical Activity < 25% (hardly ever/never)    Medications >75% (most of the time)    Monitoring 50 - 75 % (half of the time)    Problem Solving and behavior change strategies  50 - 75 % (half of the time)    Reducing Risk (treating acute and chronic complications) 50 - 75 % (half of the time)    Health Coping 50 - 75 % (half of the time)      Post-Education Assessment   Patient  understands the diabetes disease and treatment process. Comprehends key points    Patient understands incorporating nutritional management into lifestyle. Comprehends key points    Patient undertands incorporating physical activity into lifestyle. Comprehends key points    Patient understands using medications safely. Comphrehends key points    Patient understands monitoring blood glucose, interpreting and using results Comprehends key points    Patient understands prevention, detection, and treatment of acute complications. Comprehends key points    Patient understands prevention, detection, and treatment of chronic complications. Comprehends key points    Patient understands how to develop strategies to address psychosocial issues. Comprehends key points    Patient understands how to develop strategies to promote health/change behavior. Comprehends key points      Outcomes   Expected Outcomes Demonstrated interest in learning. Expect  positive outcomes    Future DMSE 3-4 months    Program Status Not Completed      Subsequent Visit   Since your last visit have you continued or begun to take your medications as prescribed? Yes    Since your last visit have you had your blood pressure checked? Yes          Individualized Plan for Diabetes Self-Management Training:   Learning Objective:  Patient will have a greater understanding of diabetes self-management. Patient education plan is to attend individual and/or group sessions per assessed needs and concerns.   Plan:   Patient Instructions   New Goal  Goal 1: do the stationary bike 3 times a week for 10-20 minutes.    Assessment of Previous Goals:    Goal: Go walking 3 times per week. - goal not met, changed to stationary bike.    Goal: Drink 2 1/2 of your cups of water daily. - goal not assessed. Continue!   Goal: try to use 1 spoon of sugar in your coffee instead of 2. - goal not met, continue.   Expected Outcomes:   Demonstrated interest in learning. Expect positive outcomes  Education material provided: no handouts today  If problems or questions, patient to contact team via:  Phone  Future DSME appointment: 3-4 months

## 2023-11-01 NOTE — Patient Instructions (Signed)
  New Goal  Goal 1: do the stationary bike 3 times a week for 10-20 minutes.    Assessment of Previous Goals:    Goal: Go walking 3 times per week. - goal not met, changed to stationary bike.    Goal: Drink 2 1/2 of your cups of water daily. - goal not assessed. Continue!   Goal: try to use 1 spoon of sugar in your coffee instead of 2. - goal not met, continue.

## 2023-11-16 ENCOUNTER — Ambulatory Visit: Admitting: Family Medicine

## 2023-11-16 ENCOUNTER — Encounter: Payer: Self-pay | Admitting: Family Medicine

## 2023-11-16 VITALS — BP 130/80 | HR 100 | Resp 16 | Ht 71.5 in | Wt 328.5 lb

## 2023-11-16 DIAGNOSIS — Z6841 Body Mass Index (BMI) 40.0 and over, adult: Secondary | ICD-10-CM

## 2023-11-16 DIAGNOSIS — I10 Essential (primary) hypertension: Secondary | ICD-10-CM

## 2023-11-16 DIAGNOSIS — M1712 Unilateral primary osteoarthritis, left knee: Secondary | ICD-10-CM

## 2023-11-16 DIAGNOSIS — Z7984 Long term (current) use of oral hypoglycemic drugs: Secondary | ICD-10-CM

## 2023-11-16 DIAGNOSIS — E1169 Type 2 diabetes mellitus with other specified complication: Secondary | ICD-10-CM

## 2023-11-16 LAB — POCT GLYCOSYLATED HEMOGLOBIN (HGB A1C): Hemoglobin A1C: 7 % — AB (ref 4.0–5.6)

## 2023-11-16 MED ORDER — RYBELSUS 3 MG PO TABS: 3.0000 mg | ORAL_TABLET | Freq: Every day | ORAL | 1 refills | Status: DC

## 2023-11-16 NOTE — Assessment & Plan Note (Signed)
 Problem is adequately controlled. Continue amlodipine -olmesartan  10-40 mg daily and HCTZ 12.5 mg daily. Continue low-salt diet. Monitor BP at home. Eye exam is overdue. Follow-up in 5 to 6 months.

## 2023-11-16 NOTE — Progress Notes (Signed)
 ACUTE VISIT Chief Complaint  Patient presents with   Knee Pain   HPI: [name] is a 68 y.o. male with a PMHx significant for Chronic Left Knee Pain; DM II; HTN; Colon Cancer, who is here today complaining of Left Knee Pain.  He says that this left knee pain has been ongoing for over 1 year now. Pain said to be located on the medial aspect of the left knee. He has noticed that this is affecting his gait as he tries to compensate for his pain using the right leg - he has tried to go to the gym 3x/ week to walk on the treadmill so that he does loose muscle strength in his left leg.  Has reportedly been seen at [facility], where he's had injections into the left knee and has been told that his knee is bone on bone.  He acknowledges that he will likely need a left TKA, however has some concerns about this as does not have the same support as when he underwent his Right TKA in 2013.  Hypertension:  Medications: Amlodipine -Olmesartan  10-40 mg daily and HCTZ 12.5 mg daily.  BP readings at home: WNL reportedly  Negative for unusual or severe headache, visual changes, exertional chest pain, dyspnea,  focal weakness, or edema. BP Readings from Last 3 Encounters:  11/16/23 130/80  07/04/23 (!) 142/92  06/23/23 134/86   Lab Results  Component Value Date   CREATININE 1.48 (H) 07/04/2023   BUN 17 07/04/2023   NA 139 07/04/2023   K 3.6 07/04/2023   CL 102 07/04/2023   CO2 31 07/04/2023   Diabetes Mellitus, type II: Dx'ed 08/2022 with HgA1C 6.9.  - Checking BG at home: no - Medications: Farxiga  10 mg daily, prescribed in January 2025 - Compliance: never started - Negative for symptoms of hypoglycemia, polyuria, polydipsia, numbness extremities, foot ulcers/trauma  Lab Results  Component Value Date   HGBA1C 7.2 (H) 04/13/2023   Review of Systems  Constitutional:  Negative for activity change, appetite change and fever.  HENT:  Negative for mouth sores and  sore throat.   Respiratory:  Negative for cough and wheezing.   Gastrointestinal:  Negative for abdominal pain, nausea and vomiting.  Endocrine: Negative for cold intolerance and heat intolerance.  Genitourinary:  Negative for decreased urine volume, dysuria and hematuria.  Musculoskeletal:  Positive for arthralgias (left knee).  Skin:  Negative for rash.  Neurological:  Negative for syncope and facial asymmetry.  See other pertinent positives and negatives in HPI.  Current Outpatient Medications on File Prior to Visit  Medication Sig Dispense Refill   amLODipine -olmesartan  (AZOR ) 10-40 MG tablet Take 1 tablet by mouth daily. 90 tablet 2   Blood Glucose Monitoring Suppl (ACCU-CHEK AVIVA PLUS) w/Device KIT As directed. 1 kit 0   cyanocobalamin  (VITAMIN B12) 1000 MCG tablet Take 1 tablet (1,000 mcg total) by mouth daily. 90 tablet 1   dapagliflozin  propanediol (FARXIGA ) 10 MG TABS tablet Take 1 tablet (10 mg total) by mouth daily before breakfast. 30 tablet 3   Glucose Blood (BLOOD GLUCOSE TEST STRIPS 333) STRP Check blood sugar daily. 100 strip 2   hydrochlorothiazide  (HYDRODIURIL ) 12.5 MG tablet Take 1 tablet (12.5 mg total) by mouth daily. 90 tablet 1   rosuvastatin  (CRESTOR ) 10 MG tablet Take 1 tablet (10 mg total) by mouth daily. 90 tablet 2   [DISCONTINUED] cloNIDine  (CATAPRES ) 0.1 MG tablet Take 0.1 mg by mouth 2 (two) times daily.      No  current facility-administered medications on file prior to visit.   Past Medical History:  Diagnosis Date   Arthritis    Cancer (HCC)    colon   Chronic low back pain    Is supposed to have surgery soon at L4-[facility]   Diabetes mellitus without complication (HCC)    Dx'ed 09/01/22 with HgA1C 6.9   Hypertension    Knee pain    Allergies  Allergen Reactions   Other     Blood products Refusal    Past Surgical History:  Procedure Laterality Date   JOINT REPLACEMENT     knee 05/2011   KNEE SURGERY  08   rt arthroscopy   LAPAROSCOPIC PARTIAL  COLECTOMY N/A 06/20/2020   Procedure: LAPAROSCOPIC PARTIAL COLECTOMY;  Surgeon: Debby Hila, MD;  Location: [facility];  Service: General;  Laterality: N/A;   TOTAL KNEE ARTHROPLASTY  05/05/2011   Procedure: TOTAL KNEE ARTHROPLASTY;  Surgeon: Toribio JULIANNA Chancy, MD;  Location: [facility] OR;  Service: Orthopedics;  Laterality: Right;  DR MURPHY WANTS 90 MINUTES FOR THIS CASE   Social History   Socioeconomic History   Marital status: Legally Separated    Spouse name: Not on file   Number of children: 3   Years of education: Not on file   Highest education level: Not on file  Occupational History   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  Tobacco Use   Smoking status: Former    Current packs/day: 0.00    Average packs/day: 0.5 packs/day for 4.0 years (2.0 ttl pk-yrs)    Types: Cigarettes    Start date: 04/27/1982    Quit date: 04/27/1986    Years since quitting: 37.5   Smokeless tobacco: Never  Vaping Use   Vaping status: Never Used  Substance and Sexual Activity   Alcohol use: Yes    Comment: drinks alcohol 2 days per week    Drug use: No   Sexual activity: Yes  Other Topics Concern   Not on file  Social History Narrative   Not on file   Social Drivers of Health   Financial Resource Strain: Low Risk  (10/05/2022)   Overall Financial Resource Strain (CARDIA)    Difficulty of Paying Living Expenses: Not hard at all  Food Insecurity: No Food Insecurity (10/05/2022)   Hunger Vital Sign    Worried About Running Out of Food in the Last Year: Never true    Ran Out of Food in the Last Year: Never true  Transportation Needs: No Transportation Needs (10/05/2022)   PRAPARE - Administrator, Civil Service (Medical): No    Lack of Transportation (Non-Medical): No  Physical Activity: Inactive (10/05/2022)   Exercise Vital Sign    Days of Exercise per Week: 0 days    Minutes of Exercise per Session: 0 min  Stress: No Stress Concern Present (10/05/2022)   [employer] of Occupational Health - Occupational Stress Questionnaire    Feeling of Stress : Not at all  Social Connections: Socially Isolated (10/05/2022)   Social Connection and Isolation Panel    Frequency of Communication with Friends and Family: More than three times a week    Frequency of Social Gatherings with Friends and Family: Twice a week    Attends Religious Services: Never    Database administrator or Organizations: No    Attends Banker Meetings: Never    Marital Status: Separated    Today's Vitals   11/16/23 [phone]  BP: 130/80  Pulse: 100  Resp: 16  SpO2: 96%  Weight: (!) 328 lb 8 oz (149 kg)  Height: 5' 11.5 (1.816 m)   Body mass index is 45.18 kg/m. Wt Readings from Last 3 Encounters:  11/16/23 (!) 328 lb 8 oz (149 kg)  11/01/23 (!) 326 lb 12.8 oz (148.2 kg)  09/14/23 (!) 323 lb 8 oz (146.7 kg)   Physical Exam Nursing note reviewed.  Constitutional:      General: He is not in acute distress.    Appearance: He is well-developed.  HENT:     Head: Normocephalic and atraumatic.     Mouth/Throat:     Dentition: Has dentures.  Eyes:     Conjunctiva/sclera: Conjunctivae normal.  Cardiovascular:     Rate and Rhythm: Normal rate and regular rhythm.     Pulses:          Dorsalis pedis pulses are 2+ on the right side and 2+ on the left side.     Heart sounds: No murmur heard.    Comments: Trace pitting LE edema, bilateral. Pulmonary:     Effort: Pulmonary effort is normal. No respiratory distress.     Breath sounds: Normal breath sounds.  Abdominal:     Palpations: Abdomen is soft. There is no mass.     Tenderness: There is no abdominal tenderness.  Skin:    General: Skin is warm.     Findings: No erythema or rash.  Neurological:     Mental Status: He is alert and oriented to person, place, and time.     Cranial Nerves: No cranial nerve deficit.     Gait: Gait normal.  Psychiatric:        Mood and Affect: Mood and affect normal.    ASSESSMENT AND PLAN: [name] was seen here today for Left Knee Pain and follow up. Orders Placed This Encounter  Procedures   Microalbumin / creatinine urine ratio   Ambulatory referral to Physical Therapy   POC HgB A1c   Lab Results  Component Value Date   HGBA1C 7.0 (A) 11/16/2023   Osteoarthritis of left knee, unspecified osteoarthritis type Assessment & Plan: He reports that he has seen ortho and has received intra articular knee injections. It was recommended to have another TKR but he has declined. He is requesting PT , hoping it will help with pain. He is not interested in topical medications like Voltaren. Continue Tylenol  500 mg 3-4 times per day prn. Wt loss may also help.  Orders: -     Ambulatory referral to Physical Therapy  Type 2 diabetes mellitus with other specified complication, without long-term current use of insulin (HCC) Assessment & Plan: We discussed Dx,prognosis,and treatment options. He has already seen nutritionist. He did not start Farxiga . Mentions that would like something to help with decreasing appetite but does not want injectable medication like Ozempic. He agrees with trying Rybelsus  3 mg daily, some side effects discussed. Periodic eye exam and appropriate foot care recommended. F/U in 5-6 months.  Orders: -     Microalbumin / creatinine urine ratio; Future -     POCT glycosylated hemoglobin (Hb A1C) -     Rybelsus ; Take 1 tablet (3 mg total) by mouth daily.  Dispense: 90 tablet; Refill: 1  Essential (primary) hypertension Assessment & Plan: Problem is adequately controlled. Continue amlodipine -olmesartan  10-40 mg daily and HCTZ 12.5 mg daily. Continue low-salt diet. Monitor BP at home. Eye exam is overdue. Follow-up in 5 to  6 months.   Morbid obesity with body mass index (BMI) of 40.0 to 44.9 in adult [facility]) Assessment & Plan: He understands the benefits of wt loss as well as adverse effects of  obesity. Consistency with healthy diet and physical activity encouraged. Rybelsus  for diabetes management may help.    Return in about 25 weeks (around 05/09/2024) for chronic problems.  I,Emily Lagle,acting as a Neurosurgeon for [place] [employer] [name], MD.,have documented all relevant documentation on the behalf of Eric Marmol [name], MD,as directed by  Eric Roesler [name], MD while in the presence of Eric Maxcy [name], MD.  I, Eric Escandon [name], MD, have reviewed all documentation for this visit. The documentation on 11/16/23 for the exam, diagnosis, procedures, and orders are all accurate and complete.  Eric Birchall G. [name], MD  [facility]. [place] office.

## 2023-11-16 NOTE — Assessment & Plan Note (Signed)
 We discussed Dx,prognosis,and treatment options. He has already seen nutritionist. He did not start Farxiga . Mentions that would like something to help with decreasing appetite but does not want injectable medication like Ozempic. He agrees with trying Rybelsus  3 mg daily, some side effects discussed. Periodic eye exam and appropriate foot care recommended. F/U in 5-6 months.

## 2023-11-16 NOTE — Assessment & Plan Note (Addendum)
 He understands the benefits of wt loss as well as adverse effects of obesity. Consistency with healthy diet and physical activity encouraged. Rybelsus  for diabetes management may help.

## 2023-11-16 NOTE — Assessment & Plan Note (Signed)
 He reports that he has seen ortho and has received intra articular knee injections. It was recommended to have another TKR but he has declined. He is requesting PT , hoping it will help with pain. He is not interested in topical medications like Voltaren. Continue Tylenol  500 mg 3-4 times per day prn. Wt loss may also help.

## 2023-11-16 NOTE — Patient Instructions (Addendum)
 A few things to remember from today's visit:  Osteoarthritis of left knee, unspecified osteoarthritis type - Plan: Ambulatory referral to Physical Therapy  Type 2 diabetes mellitus with other specified complication, without long-term current use of insulin (HCC) - Plan: Microalbumin / creatinine urine ratio, POC HgB A1c  Today start Rybelsus  3 mg daily. No changes in rest. PT order placed. I would like to see you in 5-6 months.  If you need refills for medications you take chronically, please call your pharmacy. Do not use My Chart to request refills or for acute issues that need immediate attention. If you send a my chart message, it may take a few days to be addressed, specially if I am not in the office.  Please be sure medication list is accurate. If a new problem present, please set up appointment sooner than planned today.

## 2023-11-22 ENCOUNTER — Other Ambulatory Visit: Payer: Self-pay

## 2023-11-22 ENCOUNTER — Ambulatory Visit: Attending: Family Medicine

## 2023-11-22 DIAGNOSIS — R293 Abnormal posture: Secondary | ICD-10-CM

## 2023-11-22 DIAGNOSIS — M1712 Unilateral primary osteoarthritis, left knee: Secondary | ICD-10-CM | POA: Diagnosis present

## 2023-11-22 DIAGNOSIS — R262 Difficulty in walking, not elsewhere classified: Secondary | ICD-10-CM

## 2023-11-22 DIAGNOSIS — M6281 Muscle weakness (generalized): Secondary | ICD-10-CM

## 2023-11-22 DIAGNOSIS — M25562 Pain in left knee: Secondary | ICD-10-CM

## 2023-11-22 DIAGNOSIS — M25662 Stiffness of left knee, not elsewhere classified: Secondary | ICD-10-CM

## 2023-11-22 NOTE — Therapy (Signed)
 OUTPATIENT PHYSICAL THERAPY LOWER EXTREMITY EVALUATION   Patient Name: Eric Macdonald MRN: [id number] DOB:31-Jul-1955, 68 y.o., male Today's Date: 11/22/2023  END OF SESSION:  PT End of Session - 11/22/23 1215     Visit Number 1    Date for PT Re-Evaluation 01/17/24    Authorization Type [employer] MC dual complete    Authorization Time Period No auth required    Progress Note Due on Visit 10    PT Start Time 1217    PT Stop Time 1308    PT Time Calculation (min) 51 min    Activity Tolerance Patient tolerated treatment well    Behavior During Therapy WFL for tasks assessed/performed          Past Medical History:  Diagnosis Date   Arthritis    Cancer (HCC)    colon   Chronic low back pain    Is supposed to have surgery soon at L4-5   Diabetes mellitus without complication (HCC)    Dx'ed 09/01/22 with HgA1C 6.9   Hypertension    Knee pain    Past Surgical History:  Procedure Laterality Date   JOINT REPLACEMENT     knee 05/2011   KNEE SURGERY  08   rt arthroscopy   LAPAROSCOPIC PARTIAL COLECTOMY N/A 06/20/2020   Procedure: LAPAROSCOPIC PARTIAL COLECTOMY;  Surgeon: Debby Hila, MD;  Location: [facility];  Service: General;  Laterality: N/A;   TOTAL KNEE ARTHROPLASTY  05/05/2011   Procedure: TOTAL KNEE ARTHROPLASTY;  Surgeon: Toribio JULIANNA Chancy, MD;  Location: [facility] OR;  Service: Orthopedics;  Laterality: Right;  DR MURPHY WANTS 90 MINUTES FOR THIS CASE   Patient Active Problem List   Diagnosis Date Noted   Osteoarthritis of left knee 11/16/2023   Hepatic steatosis 06/23/2023   Morbid obesity with body mass index (BMI) of 40.0 to 44.9 in adult [facility]) 04/13/2023   Chronic pain of left knee 04/13/2023   Type 2 diabetes mellitus with other specified complication, without long-term current use of insulin (HCC) 09/01/2022   Aortic atherosclerosis (HCC) 09/01/2022   B12 deficiency anemia 07/21/2020   Hypokalemia 06/21/2020   Transfusion of blood product refused for religious reason  06/21/2020   Essential (primary) hypertension    Chronic low back pain    Cancer of sigmoid colon (HCC) 06/20/2020   Insomnia 07/23/2013    PCP: [name], [name], MD  REFERRING PROVIDER: [name], [name], MD  REFERRING DIAG: [phone] (ICD-10-CM) - Osteoarthritis of left knee, unspecified osteoarthritis type  THERAPY DIAG:  Acute pain of left knee - Plan: PT plan of care cert/re-cert  Stiffness of left knee, not elsewhere classified - Plan: PT plan of care cert/re-cert  Difficulty in walking, not elsewhere classified - Plan: PT plan of care cert/re-cert  Muscle weakness (generalized) - Plan: PT plan of care cert/re-cert  Abnormal posture - Plan: PT plan of care cert/re-cert  Rationale for Evaluation and Treatment: Rehabilitation  ONSET DATE: 11/16/2023  SUBJECTIVE:   SUBJECTIVE STATEMENT: My left knee is bone on bone.  My surgeon, Dr. Josefina, did an injection a while back.  It didn't last.  I'm just not ready for another knee replacement.  I live by myself and I don't like to take opioids.  However, I am struggling to do even simple tasks like going to the grocery store. I feel like I'm compensating a lot.  The other thing is, that when I had my other knee done, I got dependent on hydrocodone  prior to my surgery.  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  He has 2 dtrs and a son who he could rehab and stay with post op but he is concerned he would be a burden.  He hopes to get stronger, develop a program that he can tolerate at the gym and put off the surgery as long as possible.    PERTINENT HISTORY: Right TKA 9 years ago.  PAIN:  Are you having pain? Yes: NPRS scale: 5/10 Pain location: left knee Pain description: aching Aggravating factors: walking, inclines, steps, bending, stooping and squatting Relieving factors: rest,  Tylenol   PRECAUTIONS: None  RED FLAGS: None   WEIGHT BEARING RESTRICTIONS: No  FALLS:  Has patient fallen in last 6 months? No  LIVING  ENVIRONMENT: Lives with: lives alone Lives in: House/apartment Stairs: No Has following equipment at home: None  OCCUPATION: Retired Surveyor, minerals  PLOF: Independent, Independent with basic ADLs, Independent with household mobility without device, Independent with community mobility without device, Independent with homemaking with ambulation, Independent with gait, and Independent with transfers  PATIENT GOALS: Trying to buy some time before needing knee replacement.    NEXT MD VISIT: prn  OBJECTIVE:  Note: Objective measures were completed at Evaluation unless otherwise noted.  DIAGNOSTIC FINDINGS: none available  PATIENT SURVEYS:  LEFS  Extreme difficulty/unable (0), Quite a bit of difficulty (1), Moderate difficulty (2), Little difficulty (3), No difficulty (4) Survey date:    Any of your usual work, housework or school activities   2. Usual hobbies, recreational or sporting activities   3. Getting into/out of the bath   4. Walking between rooms   5. Putting on socks/shoes   6. Squatting    7. Lifting an object, like a bag of groceries from the floor   8. Performing light activities around your home   9. Performing heavy activities around your home   10. Getting into/out of a car   11. Walking 2 blocks   12. Walking 1 mile   13. Going up/down 10 stairs (1 flight)   14. Standing for 1 hour   15.  sitting for 1 hour   16. Running on even ground   17. Running on uneven ground   18. Making sharp turns while running fast   19. Hopping    20. Rolling over in bed   Score total:  Complete next visit     COGNITION: Overall cognitive status: Within functional limits for tasks assessed     SENSATION: WFL   POSTURE: slight knee valgus on left  PALPATION: Moderate crepitus on left on seated knee flexion/extension  LOWER EXTREMITY ROM:  WFL  LOWER EXTREMITY MMT:  Generally 4+/5 with exception of left knee extension and flexion at end ranges 4/5  LOWER EXTREMITY  SPECIAL TESTS:  Knee special tests: Patellafemoral grind test: positive , Step up/down test: positive , and Patella tap test (ballotable patella): positive   FUNCTIONAL TESTS:  5 times sit to stand: 16.30 sec Timed up and go (TUG): 10.87sec  GAIT: Distance walked: 30 feet Assistive device utilized: None Level of assistance: Modified independence Comments: antalgic start up and weight shifted to right  TREATMENT DATE:  11/22/23 Initial eval completed and initiated HEP  PATIENT EDUCATION:  Education details: Initiated HEP and discussed appropriate time to do the knee replacement Person educated: Patient Education method: Explanation, Demonstration, Tactile cues, Verbal cues, and Handouts Education comprehension: verbalized understanding, returned demonstration, and verbal cues required  HOME EXERCISE PROGRAM: Access Code: [email or web address] URL: [email or web address] Date: 11/22/2023 Prepared by: Delon Haddock  Exercises - Supine Heel Slide  - 1 x daily - 7 x weekly - 3 sets - 10 reps - Seated Long Arc Quad  - 1 x daily - 7 x weekly - 1 sets - 20 reps - Seated Knee Extension Stretch with Chair  - 1 x daily - 7 x weekly - 1 sets - 1 reps - Supine Quadricep Sets  - 1 x daily - 7 x weekly - 3 sets - 10 reps  ASSESSMENT:  CLINICAL IMPRESSION: Patient is a 68 y.o. male who was seen today for physical therapy evaluation and treatment for left knee pain.  He is known to have end stage OA, what he describes as bone on bone.  TKA was suggested by surgeon but he hopes to hold off on this until he is stronger, loses some weight, and is able to get something set up for care post op. He presents with decreased ROM, strength and function of the left knee along with instability when pain level increased which places him at risk for falls.  He is retired but enjoys  being active.  He is struggling to be able to do his routine daily activities safely.   He would benefit from skilled PT for quad rehab, knee and hip stability and functional training.     OBJECTIVE IMPAIRMENTS: decreased balance, difficulty walking, decreased ROM, decreased strength, increased edema, increased fascial restrictions, increased muscle spasms, impaired flexibility, postural dysfunction, obesity, and pain.   ACTIVITY LIMITATIONS: carrying, lifting, bending, standing, squatting, sleeping, stairs, transfers, bed mobility, bathing, toileting, and dressing  PARTICIPATION LIMITATIONS: meal prep, cleaning, laundry, driving, shopping, community activity, yard work, and church  PERSONAL FACTORS: Fitness, Past/current experiences, Profession, and 1-2 comorbidities: obesity and Type 2 diabetes are also affecting patient's functional outcome.   REHAB POTENTIAL: Fair due to end stage  CLINICAL DECISION MAKING: Evolving/moderate complexity  EVALUATION COMPLEXITY: Moderate   GOALS: Goals reviewed with patient? Yes  SHORT TERM GOALS: Target date: 12/20/2023  Pain report to be no greater than 4/10  Baseline: Goal status: INITIAL  2.  Patient will be independent with initial HEP  Baseline:  Goal status: INITIAL   LONG TERM GOALS: Target date: 01/17/2024  Patient to report pain no greater than 2/10  Baseline:  Goal status: INITIAL  2.  Patient to be independent with advanced HEP including comprehensive gym routine Baseline:  Goal status: INITIAL  3.  Patient to be able to bend, stoop and squat with pain no greater than 2/10  Baseline:  Goal status: INITIAL  4.  Patient to be able to ascend and descend steps without pain or no greater than 2/10  Baseline:  Goal status: INITIAL  5.  LEFS score to improve by 4-5 points Baseline:  Goal status: INITIAL  6.  Functional tests to improve by 2-3 seconds Baseline:  Goal status: INITIAL   PLAN:  PT FREQUENCY: 1-2x/week  PT  DURATION: 8 weeks  PLANNED INTERVENTIONS: 97110-Therapeutic exercises, 97530- Therapeutic activity, [id number]- Neuromuscular re-education, 97535- Self Care, 02859- Manual therapy, [id number]- Gait training, [phone]- Canalith repositioning, [id number]- Aquatic Therapy, H9716- Electrical stimulation (unattended), [id number]-  Electrical stimulation (manual), 02983- Vasopneumatic device, [id number]- Ultrasound, [id number]- Ionotophoresis 4mg /ml Dexamethasone , 20560 (1-2 muscles), 20561 (3+ muscles)- Dry Needling, Patient/Family education, Balance training, Stair training, Taping, Joint mobilization, Vestibular training, DME instructions, Cryotherapy, and Moist heat  PLAN FOR NEXT SESSION: Nustep, review HEP, begin quad rehab and hip strengthening   Aunna Snooks B. Jacqualin Shirkey, PT 11/22/23 5:12 PM [facility] [address] [place], [place] [address] Phone # [phone] Fax [phone]

## 2023-11-29 ENCOUNTER — Ambulatory Visit: Admitting: Rehabilitative and Restorative Service Providers"

## 2023-11-29 ENCOUNTER — Inpatient Hospital Stay: Attending: Hematology

## 2023-11-29 ENCOUNTER — Encounter: Payer: Self-pay | Admitting: Rehabilitative and Restorative Service Providers"

## 2023-11-29 DIAGNOSIS — R262 Difficulty in walking, not elsewhere classified: Secondary | ICD-10-CM

## 2023-11-29 DIAGNOSIS — D519 Vitamin B12 deficiency anemia, unspecified: Secondary | ICD-10-CM

## 2023-11-29 DIAGNOSIS — M25562 Pain in left knee: Secondary | ICD-10-CM

## 2023-11-29 DIAGNOSIS — M25662 Stiffness of left knee, not elsewhere classified: Secondary | ICD-10-CM

## 2023-11-29 DIAGNOSIS — E538 Deficiency of other specified B group vitamins: Secondary | ICD-10-CM | POA: Insufficient documentation

## 2023-11-29 DIAGNOSIS — R293 Abnormal posture: Secondary | ICD-10-CM

## 2023-11-29 DIAGNOSIS — M6281 Muscle weakness (generalized): Secondary | ICD-10-CM

## 2023-11-29 DIAGNOSIS — I700 Atherosclerosis of aorta: Secondary | ICD-10-CM

## 2023-11-29 DIAGNOSIS — M1712 Unilateral primary osteoarthritis, left knee: Secondary | ICD-10-CM | POA: Diagnosis not present

## 2023-11-29 MED ORDER — CYANOCOBALAMIN 1000 MCG/ML IJ SOLN
1000.0000 ug | Freq: Once | INTRAMUSCULAR | Status: AC
  Administered 2023-11-29: 1000 ug via INTRAMUSCULAR
  Filled 2023-11-29: qty 1

## 2023-11-29 NOTE — Therapy (Signed)
 OUTPATIENT PHYSICAL THERAPY TREATMENT NOTE   Patient Name: Eric Macdonald MRN: [id number] DOB:[birth year], 68 y.o., male Today's Date: 11/29/2023  END OF SESSION:  PT End of Session - 11/29/23 1444     Visit Number 2    Date for PT Re-Evaluation 01/17/24    Authorization Type [employer] MC dual complete- no auth required    Progress Note Due on Visit 10    PT Start Time 1442    PT Stop Time 1520    PT Time Calculation (min) 38 min    Activity Tolerance Patient tolerated treatment well    Behavior During Therapy WFL for tasks assessed/performed          Past Medical History:  Diagnosis Date   Arthritis    Cancer (HCC)    colon   Chronic low back pain    Is supposed to have surgery soon at L4-5   Diabetes mellitus without complication (HCC)    Dx'ed 09/01/22 with HgA1C 6.9   Hypertension    Knee pain    Past Surgical History:  Procedure Laterality Date   JOINT REPLACEMENT     knee 05/2011   KNEE SURGERY  08   rt arthroscopy   LAPAROSCOPIC PARTIAL COLECTOMY N/A 06/20/2020   Procedure: LAPAROSCOPIC PARTIAL COLECTOMY;  Surgeon: Debby Hila, MD;  Location: [facility];  Service: General;  Laterality: N/A;   TOTAL KNEE ARTHROPLASTY  05/05/2011   Procedure: TOTAL KNEE ARTHROPLASTY;  Surgeon: Toribio JULIANNA Chancy, MD;  Location: [facility] OR;  Service: Orthopedics;  Laterality: Right;  DR MURPHY WANTS 90 MINUTES FOR THIS CASE   Patient Active Problem List   Diagnosis Date Noted   Osteoarthritis of left knee 11/16/2023   Hepatic steatosis 06/23/2023   Morbid obesity with body mass index (BMI) of 40.0 to 44.9 in adult [facility]) 04/13/2023   Chronic pain of left knee 04/13/2023   Type 2 diabetes mellitus with other specified complication, without long-term current use of insulin (HCC) 09/01/2022   Aortic atherosclerosis (HCC) 09/01/2022   B12 deficiency anemia 07/21/2020   Hypokalemia 06/21/2020   Transfusion of blood product refused for religious reason 06/21/2020   Essential (primary)  hypertension    Chronic low back pain    Cancer of sigmoid colon (HCC) 06/20/2020   Insomnia 07/23/2013    PCP: [name], [name], MD  REFERRING PROVIDER: [name], [name], MD  REFERRING DIAG: [phone] (ICD-10-CM) - Osteoarthritis of left knee, unspecified osteoarthritis type  THERAPY DIAG:  Acute pain of left knee  Stiffness of left knee, not elsewhere classified  Difficulty in walking, not elsewhere classified  Muscle weakness (generalized)  Abnormal posture  Rationale for Evaluation and Treatment: Rehabilitation  ONSET DATE: 11/16/2023  SUBJECTIVE:   SUBJECTIVE STATEMENT: Patient reports that his knee pain is always a 5/10.  States that he continues to go out and stay as active as he can and that he has been doing his exercises.  PERTINENT HISTORY: Right TKA 9 years ago.  PAIN:  Are you having pain? Yes: NPRS scale: currently 5/10 Pain location: left knee Pain description: aching Aggravating factors: walking, inclines, steps, bending, stooping and squatting Relieving factors: rest,  Tylenol   PRECAUTIONS: None  RED FLAGS: None   WEIGHT BEARING RESTRICTIONS: No  FALLS:  Has patient fallen in last 6 months? No  LIVING ENVIRONMENT: Lives with: lives alone Lives in: House/apartment Stairs: No Has following equipment at home: None  OCCUPATION: Retired Surveyor, [employer]  PLOF: Independent, Independent with basic ADLs, Independent with household mobility  without device, Independent with community mobility without device, Independent with homemaking with ambulation, Independent with gait, and Independent with transfers  PATIENT GOALS: Trying to buy some time before needing knee replacement.    NEXT MD VISIT: prn  OBJECTIVE:  Note: Objective measures were completed at Evaluation unless otherwise noted.  DIAGNOSTIC FINDINGS: none available  PATIENT SURVEYS:  LEFS  Extreme difficulty/unable (0), Quite a bit of difficulty (1), Moderate difficulty (2), Little  difficulty (3), No difficulty (4) Survey date:  11/29/23  Any of your usual work, housework or school activities 3  2. Usual hobbies, recreational or sporting activities 2  3. Getting into/out of the bath 4  4. Walking between rooms 4  5. Putting on socks/shoes 2  6. Squatting  2  7. Lifting an object, like a bag of groceries from the floor 2  8. Performing light activities around your home 4  9. Performing heavy activities around your home 2  10. Getting into/out of a car 1  11. Walking 2 blocks 0  12. Walking 1 mile 0  13. Going up/down 10 stairs (1 flight) 3  14. Standing for 1 hour 2  15.  sitting for 1 hour 2  16. Running on even ground 0  17. Running on uneven ground 0  18. Making sharp turns while running fast 0  19. Hopping  0  20. Rolling over in bed 2  Score total:  35/80 = 43.75%      COGNITION: Overall cognitive status: Within functional limits for tasks assessed     SENSATION: WFL   POSTURE: slight knee valgus on left  PALPATION: Moderate crepitus on left on seated knee flexion/extension  LOWER EXTREMITY ROM:  WFL  LOWER EXTREMITY MMT:  Generally 4+/5 with exception of left knee extension and flexion at end ranges 4/5  LOWER EXTREMITY SPECIAL TESTS:  Knee special tests: Patellafemoral grind test: positive , Step up/down test: positive , and Patella tap test (ballotable patella): positive   FUNCTIONAL TESTS:  Eval: 5 times sit to stand: 16.30 sec Timed up and go (TUG): 10.87sec  11/29/2023: 6 minute walk test:  906 ft with RPE of 5/10  GAIT: Distance walked: 30 feet Assistive device utilized: None Level of assistance: Modified independence Comments: antalgic start up and weight shifted to right                                                                                                                                TREATMENT DATE:  11/29/2023: Nustep level 3 x6 min with PT present to discuss status Lower Extremity Functional  Scale Seated hamstring stretch 2x20 sec bilat Seated LAQ and marching with 2# ankle weights 2x10 each bilat Seated hip adduction ball squeeze 2x10 Seated hip abduction with yellow loop 2x10 Sit to/from stand 2x5 6 minute walk test:  906 ft with RPE of 5/10   11/22/23 Initial eval completed and initiated HEP  PATIENT EDUCATION:  Education details: Initiated HEP and discussed appropriate time to do the knee replacement Person educated: Patient Education method: Explanation, Demonstration, Tactile cues, Verbal cues, and Handouts Education comprehension: verbalized understanding, returned demonstration, and verbal cues required  HOME EXERCISE PROGRAM: Access Code: [email or web address] URL: [email or web address] Date: 11/22/2023 Prepared by: Delon Haddock  Exercises - Supine Heel Slide  - 1 x daily - 7 x weekly - 3 sets - 10 reps - Seated Long Arc Quad  - 1 x daily - 7 x weekly - 1 sets - 20 reps - Seated Knee Extension Stretch with Chair  - 1 x daily - 7 x weekly - 1 sets - 1 reps - Supine Quadricep Sets  - 1 x daily - 7 x weekly - 3 sets - 10 reps  ASSESSMENT:  CLINICAL IMPRESSION: Mr Zangara presents to skilled PT stating that he has been doing his exercises and tries to stay active.  Patient was able to complete a Lower Extremity Functional Scale and a 6 minute walk today to establish a baseline measurement.  Patient able to add ankle weights for long arc quad today.  Provided cuing for slower movement to facilitate patient having increased muscle recruitment.  Patient continues to require skilled PT to progress towards goal related activities.  OBJECTIVE IMPAIRMENTS: decreased balance, difficulty walking, decreased ROM, decreased strength, increased edema, increased fascial restrictions, increased muscle spasms, impaired flexibility, postural dysfunction, obesity, and pain.   ACTIVITY LIMITATIONS: carrying, lifting, bending, standing, squatting, sleeping, stairs, transfers, bed  mobility, bathing, toileting, and dressing  PARTICIPATION LIMITATIONS: meal prep, cleaning, laundry, driving, shopping, community activity, yard work, and church  PERSONAL FACTORS: Fitness, Past/current experiences, Profession, and 1-2 comorbidities: obesity and Type 2 diabetes are also affecting patient's functional outcome.   REHAB POTENTIAL: Fair due to end stage  CLINICAL DECISION MAKING: Evolving/moderate complexity  EVALUATION COMPLEXITY: Moderate   GOALS: Goals reviewed with patient? Yes  SHORT TERM GOALS: Target date: 12/20/2023  Pain report to be no greater than 4/10  Baseline: Goal status: INITIAL  2.  Patient will be independent with initial HEP  Baseline:  Goal status: Ongoing   LONG TERM GOALS: Target date: 01/17/2024  Patient to report pain no greater than 2/10  Baseline:  Goal status: INITIAL  2.  Patient to be independent with advanced HEP including comprehensive gym routine Baseline:  Goal status: INITIAL  3.  Patient to be able to bend, stoop and squat with pain no greater than 2/10  Baseline:  Goal status: INITIAL  4.  Patient to be able to ascend and descend steps without pain or no greater than 2/10  Baseline:  Goal status: INITIAL  5.  LEFS score to improve by 4-5 points Baseline: 35/80 = 43.75% Goal status: INITIAL  6.  Functional tests to improve by 2-3 seconds Baseline:  Goal status: INITIAL   PLAN:  PT FREQUENCY: 1-2x/week  PT DURATION: 8 weeks  PLANNED INTERVENTIONS: 97110-Therapeutic exercises, 97530- Therapeutic activity, 97112- Neuromuscular re-education, 97535- Self Care, 02859- Manual therapy, [phone]- Gait training, [phone]- Canalith repositioning, [id number]- Aquatic Therapy, [phone]- Electrical stimulation (unattended), [phone]- Electrical stimulation (manual), [id number]- Vasopneumatic device, [id number]- Ultrasound, [id number]- Ionotophoresis 4mg /ml Dexamethasone , 79439 (1-2 muscles), 20561 (3+ muscles)- Dry Needling, Patient/Family education,  Balance training, Stair training, Taping, Joint mobilization, Vestibular training, DME instructions, Cryotherapy, and Moist heat  PLAN FOR NEXT SESSION: Nustep, review HEP, begin quad rehab and hip strengthening   Jarrell Laming, PT, DPT 11/29/23, 3:34 PM  [facility] [address] [place],  [place] [address] Phone # [phone] Fax [phone]

## 2023-12-01 ENCOUNTER — Ambulatory Visit

## 2023-12-01 DIAGNOSIS — M1712 Unilateral primary osteoarthritis, left knee: Secondary | ICD-10-CM | POA: Diagnosis not present

## 2023-12-01 DIAGNOSIS — R252 Cramp and spasm: Secondary | ICD-10-CM

## 2023-12-01 DIAGNOSIS — R293 Abnormal posture: Secondary | ICD-10-CM

## 2023-12-01 DIAGNOSIS — M6281 Muscle weakness (generalized): Secondary | ICD-10-CM

## 2023-12-01 DIAGNOSIS — M25662 Stiffness of left knee, not elsewhere classified: Secondary | ICD-10-CM

## 2023-12-01 DIAGNOSIS — M25562 Pain in left knee: Secondary | ICD-10-CM

## 2023-12-01 DIAGNOSIS — R262 Difficulty in walking, not elsewhere classified: Secondary | ICD-10-CM

## 2023-12-01 NOTE — Therapy (Signed)
 OUTPATIENT PHYSICAL THERAPY TREATMENT NOTE   Patient Name: Eric Macdonald MRN: [id number] DOB:October 29, 1955, 68 y.o., male Today's Date: 12/01/2023  END OF SESSION:  PT End of Session - 12/01/23 1154     Visit Number 3    Date for Recertification  01/17/24    Authorization Type [employer] MC dual complete- no auth required    Authorization Time Period No auth required    Progress Note Due on Visit 10    PT Start Time 1145    PT Stop Time 1240    PT Time Calculation (min) 55 min    Activity Tolerance Patient tolerated treatment well    Behavior During Therapy WFL for tasks assessed/performed          Past Medical History:  Diagnosis Date   Arthritis    Cancer (HCC)    colon   Chronic low back pain    Is supposed to have surgery soon at L4-5   Diabetes mellitus without complication (HCC)    Dx'ed 09/01/22 with HgA1C 6.9   Hypertension    Knee pain    Past Surgical History:  Procedure Laterality Date   JOINT REPLACEMENT     knee 05/2011   KNEE SURGERY  08   rt arthroscopy   LAPAROSCOPIC PARTIAL COLECTOMY N/A 06/20/2020   Procedure: LAPAROSCOPIC PARTIAL COLECTOMY;  Surgeon: Debby Hila, MD;  Location: [facility];  Service: General;  Laterality: N/A;   TOTAL KNEE ARTHROPLASTY  05/05/2011   Procedure: TOTAL KNEE ARTHROPLASTY;  Surgeon: Toribio JULIANNA Chancy, MD;  Location: [facility] OR;  Service: Orthopedics;  Laterality: Right;  DR MURPHY WANTS 90 MINUTES FOR THIS CASE   Patient Active Problem List   Diagnosis Date Noted   Osteoarthritis of left knee 11/16/2023   Hepatic steatosis 06/23/2023   Morbid obesity with body mass index (BMI) of 40.0 to 44.9 in adult [facility]) 04/13/2023   Chronic pain of left knee 04/13/2023   Type 2 diabetes mellitus with other specified complication, without long-term current use of insulin (HCC) 09/01/2022   Aortic atherosclerosis (HCC) 09/01/2022   B12 deficiency anemia 07/21/2020   Hypokalemia 06/21/2020   Transfusion of blood product refused for religious  reason 06/21/2020   Essential (primary) hypertension    Chronic low back pain    Cancer of sigmoid colon (HCC) 06/20/2020   Insomnia 07/23/2013    PCP: [name], [name], MD  REFERRING PROVIDER: [name], [name], MD  REFERRING DIAG: [phone] (ICD-10-CM) - Osteoarthritis of left knee, unspecified osteoarthritis type  THERAPY DIAG:  Acute pain of left knee  Stiffness of left knee, not elsewhere classified  Difficulty in walking, not elsewhere classified  Muscle weakness (generalized)  Cramp and spasm  Abnormal posture  Rationale for Evaluation and Treatment: Rehabilitation  ONSET DATE: 11/16/2023  SUBJECTIVE:   SUBJECTIVE STATEMENT: Patient reports soreness from last visit.  Doing ok but pain still around 5/10.  I've spoken to my son about possibly staying with him if I opt to move fwd with the knee replacement.    PERTINENT HISTORY: Right TKA 9 years ago.  PAIN:  12/01/23 Are you having pain? Yes: NPRS scale: currently 5/10 Pain location: left knee Pain description: aching Aggravating factors: walking, inclines, steps, bending, stooping and squatting Relieving factors: rest,  Tylenol   PRECAUTIONS: None  RED FLAGS: None   WEIGHT BEARING RESTRICTIONS: No  FALLS:  Has patient fallen in last 6 months? No  LIVING ENVIRONMENT: Lives with: lives alone Lives in: House/apartment Stairs: No Has following equipment  at home: None  OCCUPATION: Retired Surveyor, [employer]  PLOF: Independent, Independent with basic ADLs, Independent with household mobility without device, Independent with community mobility without device, Independent with homemaking with ambulation, Independent with gait, and Independent with transfers  PATIENT GOALS: Trying to buy some time before needing knee replacement.    NEXT MD VISIT: prn  OBJECTIVE:  Note: Objective measures were completed at Evaluation unless otherwise noted.  DIAGNOSTIC FINDINGS: none available  PATIENT SURVEYS:  LEFS  Extreme  difficulty/unable (0), Quite a bit of difficulty (1), Moderate difficulty (2), Little difficulty (3), No difficulty (4) Survey date:  11/29/23  Any of your usual work, housework or school activities 3  2. Usual hobbies, recreational or sporting activities 2  3. Getting into/out of the bath 4  4. Walking between rooms 4  5. Putting on socks/shoes 2  6. Squatting  2  7. Lifting an object, like a bag of groceries from the floor 2  8. Performing light activities around your home 4  9. Performing heavy activities around your home 2  10. Getting into/out of a car 1  11. Walking 2 blocks 0  12. Walking 1 mile 0  13. Going up/down 10 stairs (1 flight) 3  14. Standing for 1 hour 2  15.  sitting for 1 hour 2  16. Running on even ground 0  17. Running on uneven ground 0  18. Making sharp turns while running fast 0  19. Hopping  0  20. Rolling over in bed 2  Score total:  35/80 = 43.75%      COGNITION: Overall cognitive status: Within functional limits for tasks assessed     SENSATION: WFL   POSTURE: slight knee valgus on left  PALPATION: Moderate crepitus on left on seated knee flexion/extension  LOWER EXTREMITY ROM:  WFL  LOWER EXTREMITY MMT:  Generally 4+/5 with exception of left knee extension and flexion at end ranges 4/5  LOWER EXTREMITY SPECIAL TESTS:  Knee special tests: Patellafemoral grind test: positive , Step up/down test: positive , and Patella tap test (ballotable patella): positive   FUNCTIONAL TESTS:  Eval: 5 times sit to stand: 16.30 sec Timed up and go (TUG): 10.87sec  11/29/2023: 6 minute walk test:  906 ft with RPE of 5/10  GAIT: Distance walked: 30 feet Assistive device utilized: None Level of assistance: Modified independence Comments: antalgic start up and weight shifted to right                                                                                                                                TREATMENT DATE:  12/01/2023: Nustep  level 3 x 6 min with PT present to discuss status and progress toward goals Sit to stand x 10 with 10 lb kb Squat to table x 10 with 10 lb kb Seated LAQ with 5# 2 x 10 Step up onto 6 step x 10 Step up and hold fwd x 10  then lateral x 10 on balance pad Leg Press 2 x 10 with 125# both,  65# left  Lateral band walks with blue loop x 3 laps of 10 steps Ice to left knee x 10 min at end of session seated with heel propped on foot stool  11/29/2023: Nustep level 3 x6 min with PT present to discuss status Lower Extremity Functional Scale Seated hamstring stretch 2x20 sec bilat Seated LAQ and marching with 2# ankle weights 2x10 each bilat Seated hip adduction ball squeeze 2x10 Seated hip abduction with yellow loop 2x10 Sit to/from stand 2x5 6 minute walk test:  906 ft with RPE of 5/10   11/22/23 Initial eval completed and initiated HEP  PATIENT EDUCATION:  Education details: Initiated HEP and discussed appropriate time to do the knee replacement Person educated: Patient Education method: Explanation, Demonstration, Tactile cues, Verbal cues, and Handouts Education comprehension: verbalized understanding, returned demonstration, and verbal cues required  HOME EXERCISE PROGRAM: Access Code: [email or web address] URL: [email or web address] Date: 11/22/2023 Prepared by: Delon Haddock  Exercises - Supine Heel Slide  - 1 x daily - 7 x weekly - 3 sets - 10 reps - Seated Long Arc Quad  - 1 x daily - 7 x weekly - 1 sets - 20 reps - Seated Knee Extension Stretch with Chair  - 1 x daily - 7 x weekly - 1 sets - 1 reps - Supine Quadricep Sets  - 1 x daily - 7 x weekly - 3 sets - 10 reps  ASSESSMENT:  CLINICAL IMPRESSION: Eric Macdonald had some soreness after last visit, but states he is no worse.  He was able to complete all tasks today with no increase in pain or anterior knee symptoms.  We discussed importance of consistency with HEP.  He should continue to progress.  Patient continues to require  skilled PT to progress towards goal related activities.  OBJECTIVE IMPAIRMENTS: decreased balance, difficulty walking, decreased ROM, decreased strength, increased edema, increased fascial restrictions, increased muscle spasms, impaired flexibility, postural dysfunction, obesity, and pain.   ACTIVITY LIMITATIONS: carrying, lifting, bending, standing, squatting, sleeping, stairs, transfers, bed mobility, bathing, toileting, and dressing  PARTICIPATION LIMITATIONS: meal prep, cleaning, laundry, driving, shopping, community activity, yard work, and church  PERSONAL FACTORS: Fitness, Past/current experiences, Profession, and 1-2 comorbidities: obesity and Type 2 diabetes are also affecting patient's functional outcome.   REHAB POTENTIAL: Fair due to end stage  CLINICAL DECISION MAKING: Evolving/moderate complexity  EVALUATION COMPLEXITY: Moderate   GOALS: Goals reviewed with patient? Yes  SHORT TERM GOALS: Target date: 12/20/2023  Pain report to be no greater than 4/10  Baseline: Goal status: Ongoing  2.  Patient will be independent with initial HEP  Baseline:  Goal status: Ongoing   LONG TERM GOALS: Target date: 01/17/2024  Patient to report pain no greater than 2/10  Baseline:  Goal status: INITIAL  2.  Patient to be independent with advanced HEP including comprehensive gym routine Baseline:  Goal status: INITIAL  3.  Patient to be able to bend, stoop and squat with pain no greater than 2/10  Baseline:  Goal status: INITIAL  4.  Patient to be able to ascend and descend steps without pain or no greater than 2/10  Baseline:  Goal status: INITIAL  5.  LEFS score to improve by 4-5 points Baseline: 35/80 = 43.75% Goal status: INITIAL  6.  Functional tests to improve by 2-3 seconds Baseline:  Goal status: INITIAL   PLAN:  PT FREQUENCY: 1-2x/week  PT DURATION: 8  weeks  PLANNED INTERVENTIONS: 97110-Therapeutic exercises, 97530- Therapeutic activity, [id number]-  Neuromuscular re-education, [phone]- Self Care, 02859- Manual therapy, [phone]- Gait training, [phone]- Canalith repositioning, [id number]- Aquatic Therapy, [phone]- Electrical stimulation (unattended), [phone]- Electrical stimulation (manual), [id number]- Vasopneumatic device, [id number]- Ultrasound, [id number]- Ionotophoresis 4mg /ml Dexamethasone , 79439 (1-2 muscles), [id number] (3+ muscles)- Dry Needling, Patient/Family education, Balance training, Stair training, Taping, Joint mobilization, Vestibular training, DME instructions, Cryotherapy, and Moist heat  PLAN FOR NEXT SESSION: Nustep, review HEP, progress quad rehab and hip strengthening   Faith Patricelli B. Tiona Ruane, PT 12/01/23 4:07 PM [facility] [address] [place], [place] [address] Phone # [phone] Fax [phone]

## 2023-12-06 ENCOUNTER — Ambulatory Visit

## 2023-12-06 DIAGNOSIS — M25662 Stiffness of left knee, not elsewhere classified: Secondary | ICD-10-CM

## 2023-12-06 DIAGNOSIS — R293 Abnormal posture: Secondary | ICD-10-CM

## 2023-12-06 DIAGNOSIS — M1712 Unilateral primary osteoarthritis, left knee: Secondary | ICD-10-CM | POA: Diagnosis not present

## 2023-12-06 DIAGNOSIS — R252 Cramp and spasm: Secondary | ICD-10-CM

## 2023-12-06 DIAGNOSIS — M25562 Pain in left knee: Secondary | ICD-10-CM

## 2023-12-06 DIAGNOSIS — R262 Difficulty in walking, not elsewhere classified: Secondary | ICD-10-CM

## 2023-12-06 DIAGNOSIS — M6281 Muscle weakness (generalized): Secondary | ICD-10-CM

## 2023-12-06 NOTE — Therapy (Signed)
 OUTPATIENT PHYSICAL THERAPY TREATMENT NOTE   Patient Name: Eric Macdonald MRN: [id number] DOB:May 09, 1955, 68 y.o., male Today's Date: 12/06/2023  END OF SESSION:  PT End of Session - 12/06/23 1147     Visit Number 4    Date for Recertification  01/17/24    Authorization Type [employer] MC dual complete- no auth required    Authorization Time Period No auth required    PT Start Time 1147    PT Stop Time 1241    PT Time Calculation (min) 54 min    Activity Tolerance Patient tolerated treatment well    Behavior During Therapy WFL for tasks assessed/performed          Past Medical History:  Diagnosis Date   Arthritis    Cancer (HCC)    colon   Chronic low back pain    Is supposed to have surgery soon at L4-5   Diabetes mellitus without complication (HCC)    Dx'ed 09/01/22 with HgA1C 6.9   Hypertension    Knee pain    Past Surgical History:  Procedure Laterality Date   JOINT REPLACEMENT     knee 05/2011   KNEE SURGERY  08   rt arthroscopy   LAPAROSCOPIC PARTIAL COLECTOMY N/A 06/20/2020   Procedure: LAPAROSCOPIC PARTIAL COLECTOMY;  Surgeon: Debby Hila, MD;  Location: [facility];  Service: General;  Laterality: N/A;   TOTAL KNEE ARTHROPLASTY  05/05/2011   Procedure: TOTAL KNEE ARTHROPLASTY;  Surgeon: Toribio JULIANNA Chancy, MD;  Location: [facility] OR;  Service: Orthopedics;  Laterality: Right;  DR MURPHY WANTS 90 MINUTES FOR THIS CASE   Patient Active Problem List   Diagnosis Date Noted   Osteoarthritis of left knee 11/16/2023   Hepatic steatosis 06/23/2023   Morbid obesity with body mass index (BMI) of 40.0 to 44.9 in adult [facility]) 04/13/2023   Chronic pain of left knee 04/13/2023   Type 2 diabetes mellitus with other specified complication, without long-term current use of insulin (HCC) 09/01/2022   Aortic atherosclerosis 09/01/2022   B12 deficiency anemia 07/21/2020   Hypokalemia 06/21/2020   Transfusion of blood product refused for religious reason 06/21/2020   Essential (primary)  hypertension    Chronic low back pain    Cancer of sigmoid colon (HCC) 06/20/2020   Insomnia 07/23/2013    PCP: [name], [name], MD  REFERRING PROVIDER: [name], [name], MD  REFERRING DIAG: [phone] (ICD-10-CM) - Osteoarthritis of left knee, unspecified osteoarthritis type  THERAPY DIAG:  Acute pain of left knee  Stiffness of left knee, not elsewhere classified  Difficulty in walking, not elsewhere classified  Muscle weakness (generalized)  Cramp and spasm  Abnormal posture  Rationale for Evaluation and Treatment: Rehabilitation  ONSET DATE: 11/16/2023  SUBJECTIVE:   SUBJECTIVE STATEMENT: Patient reports I'm still at a 5/10 at worst but the pain isn't as often and I'm able to get up and down from my chair a lot easier.    PERTINENT HISTORY: Right TKA 9 years ago.  PAIN:  12/06/23 Are you having pain? Yes: NPRS scale: currently 5/10 Pain location: left knee Pain description: aching Aggravating factors: walking, inclines, steps, bending, stooping and squatting Relieving factors: rest,  Tylenol   PRECAUTIONS: None  RED FLAGS: None   WEIGHT BEARING RESTRICTIONS: No  FALLS:  Has patient fallen in last 6 months? No  LIVING ENVIRONMENT: Lives with: lives alone Lives in: House/apartment Stairs: No Has following equipment at home: None  OCCUPATION: Retired Surveyor, [employer]  PLOF: Independent, Independent with basic ADLs, Independent with  household mobility without device, Independent with community mobility without device, Independent with homemaking with ambulation, Independent with gait, and Independent with transfers  PATIENT GOALS: Trying to buy some time before needing knee replacement.    NEXT MD VISIT: prn  OBJECTIVE:  Note: Objective measures were completed at Evaluation unless otherwise noted.  DIAGNOSTIC FINDINGS: none available  PATIENT SURVEYS:  LEFS  Extreme difficulty/unable (0), Quite a bit of difficulty (1), Moderate difficulty (2), Little  difficulty (3), No difficulty (4) Survey date:  11/29/23  Any of your usual work, housework or school activities 3  2. Usual hobbies, recreational or sporting activities 2  3. Getting into/out of the bath 4  4. Walking between rooms 4  5. Putting on socks/shoes 2  6. Squatting  2  7. Lifting an object, like a bag of groceries from the floor 2  8. Performing light activities around your home 4  9. Performing heavy activities around your home 2  10. Getting into/out of a car 1  11. Walking 2 blocks 0  12. Walking 1 mile 0  13. Going up/down 10 stairs (1 flight) 3  14. Standing for 1 hour 2  15.  sitting for 1 hour 2  16. Running on even ground 0  17. Running on uneven ground 0  18. Making sharp turns while running fast 0  19. Hopping  0  20. Rolling over in bed 2  Score total:  35/80 = 43.75%      COGNITION: Overall cognitive status: Within functional limits for tasks assessed     SENSATION: WFL   POSTURE: slight knee valgus on left  PALPATION: Moderate crepitus on left on seated knee flexion/extension  LOWER EXTREMITY ROM:  WFL  LOWER EXTREMITY MMT:  Generally 4+/5 with exception of left knee extension and flexion at end ranges 4/5  LOWER EXTREMITY SPECIAL TESTS:  Knee special tests: Patellafemoral grind test: positive , Step up/down test: positive , and Patella tap test (ballotable patella): positive   FUNCTIONAL TESTS:  Eval: 5 times sit to stand: 16.30 sec Timed up and go (TUG): 10.87sec  11/29/2023: 6 minute walk test:  906 ft with RPE of 5/10  GAIT: Distance walked: 30 feet Assistive device utilized: None Level of assistance: Modified independence Comments: antalgic start up and weight shifted to right                                                                                                                                TREATMENT DATE:  12/06/2023: Nustep level 5 x 6 min with PT present to discuss status and progress toward goals Hip matrix :  hip ext and abduction 2 x 10 each with 45# Sit to stand x 10 with 10 lb kb Squat to table x 10 with 10 lb kb Seated LAQ with 6# 2 x 10 Step up onto 6 step 2 x 10 each LE (observed rocking to ascend on left with  UE support) Lunge to BOSU fwd x 10 each LE Leg Press 2 x 10 with 125# both,  65# left  Ice to left knee x 10 min at end of session seated with heel propped on foot stool  12/01/2023: Nustep level 3 x 6 min with PT present to discuss status and progress toward goals Sit to stand x 10 with 10 lb kb Squat to table x 10 with 10 lb kb Seated LAQ with 5# 2 x 10 Step up onto 6 step x 10 Step up and hold fwd x 10 then lateral x 10 on balance pad Leg Press 2 x 10 with 125# both,  65# left  Lateral band walks with blue loop x 3 laps of 10 steps Ice to left knee x 10 min at end of session seated with heel propped on foot stool  11/29/2023: Nustep level 3 x6 min with PT present to discuss status Lower Extremity Functional Scale Seated hamstring stretch 2x20 sec bilat Seated LAQ and marching with 2# ankle weights 2x10 each bilat Seated hip adduction ball squeeze 2x10 Seated hip abduction with yellow loop 2x10 Sit to/from stand 2x5 6 minute walk test:  906 ft with RPE of 5/10   11/22/23 Initial eval completed and initiated HEP  PATIENT EDUCATION:  Education details: Initiated HEP and discussed appropriate time to do the knee replacement Person educated: Patient Education method: Explanation, Demonstration, Tactile cues, Verbal cues, and Handouts Education comprehension: verbalized understanding, returned demonstration, and verbal cues required  HOME EXERCISE PROGRAM: Access Code: [email or web address] URL: [email or web address] Date: 11/22/2023 Prepared by: Delon Haddock  Exercises - Supine Heel Slide  - 1 x daily - 7 x weekly - 3 sets - 10 reps - Seated Long Arc Quad  - 1 x daily - 7 x weekly - 1 sets - 20 reps - Seated Knee Extension Stretch with Chair  - 1 x daily - 7 x  weekly - 1 sets - 1 reps - Supine Quadricep Sets  - 1 x daily - 7 x weekly - 3 sets - 10 reps  ASSESSMENT:  CLINICAL IMPRESSION: Patient is progressing appropriately.  He fatigues easily with regard to his cardio endurance but is able to complete all reps without rest.  He was somewhat talkative today needing to be re-directed.    He was able to complete all tasks today with no increase in pain or anterior knee symptoms.  We discussed importance of consistency with HEP.  He should continue to progress.  Patient continues to require skilled PT to progress towards goal related activities.  OBJECTIVE IMPAIRMENTS: decreased balance, difficulty walking, decreased ROM, decreased strength, increased edema, increased fascial restrictions, increased muscle spasms, impaired flexibility, postural dysfunction, obesity, and pain.   ACTIVITY LIMITATIONS: carrying, lifting, bending, standing, squatting, sleeping, stairs, transfers, bed mobility, bathing, toileting, and dressing  PARTICIPATION LIMITATIONS: meal prep, cleaning, laundry, driving, shopping, community activity, yard work, and church  PERSONAL FACTORS: Fitness, Past/current experiences, Profession, and 1-2 comorbidities: obesity and Type 2 diabetes are also affecting patient's functional outcome.   REHAB POTENTIAL: Fair due to end stage  CLINICAL DECISION MAKING: Evolving/moderate complexity  EVALUATION COMPLEXITY: Moderate   GOALS: Goals reviewed with patient? Yes  SHORT TERM GOALS: Target date: 12/20/2023  Pain report to be no greater than 4/10  Baseline: Goal status: Ongoing  2.  Patient will be independent with initial HEP  Baseline:  Goal status: Ongoing   LONG TERM GOALS: Target date: 01/17/2024  Patient to report pain no greater than  2/10  Baseline:  Goal status: INITIAL  2.  Patient to be independent with advanced HEP including comprehensive gym routine Baseline:  Goal status: INITIAL  3.  Patient to be able to bend,  stoop and squat with pain no greater than 2/10  Baseline:  Goal status: INITIAL  4.  Patient to be able to ascend and descend steps without pain or no greater than 2/10  Baseline:  Goal status: INITIAL  5.  LEFS score to improve by 4-5 points Baseline: 35/80 = 43.75% Goal status: INITIAL  6.  Functional tests to improve by 2-3 seconds Baseline:  Goal status: INITIAL   PLAN:  PT FREQUENCY: 1-2x/week  PT DURATION: 8 weeks  PLANNED INTERVENTIONS: 97110-Therapeutic exercises, 97530- Therapeutic activity, [id number]- Neuromuscular re-education, 97535- Self Care, 02859- Manual therapy, [id number]- Gait training, [phone]- Canalith repositioning, [id number]- Aquatic Therapy, [phone]- Electrical stimulation (unattended), [phone]- Electrical stimulation (manual), [id number]- Vasopneumatic device, [id number]- Ultrasound, [id number]- Ionotophoresis 4mg /ml Dexamethasone , 79439 (1-2 muscles), 20561 (3+ muscles)- Dry Needling, Patient/Family education, Balance training, Stair training, Taping, Joint mobilization, Vestibular training, DME instructions, Cryotherapy, and Moist heat  PLAN FOR NEXT SESSION: Nustep, review HEP, progress quad rehab and hip strengthening   Kamryn Messineo B. Marcina Kinnison, PT 12/06/23 12:31 PM [facility] [address], [address] [place], [place] [address] Phone # [phone] Fax [phone]

## 2023-12-08 ENCOUNTER — Ambulatory Visit: Admitting: Rehabilitative and Restorative Service Providers"

## 2023-12-08 ENCOUNTER — Encounter: Payer: Self-pay | Admitting: Rehabilitative and Restorative Service Providers"

## 2023-12-08 DIAGNOSIS — M6281 Muscle weakness (generalized): Secondary | ICD-10-CM

## 2023-12-08 DIAGNOSIS — M25662 Stiffness of left knee, not elsewhere classified: Secondary | ICD-10-CM

## 2023-12-08 DIAGNOSIS — R262 Difficulty in walking, not elsewhere classified: Secondary | ICD-10-CM

## 2023-12-08 DIAGNOSIS — R293 Abnormal posture: Secondary | ICD-10-CM

## 2023-12-08 DIAGNOSIS — M1712 Unilateral primary osteoarthritis, left knee: Secondary | ICD-10-CM | POA: Diagnosis not present

## 2023-12-08 DIAGNOSIS — M25562 Pain in left knee: Secondary | ICD-10-CM

## 2023-12-08 NOTE — Therapy (Signed)
 OUTPATIENT PHYSICAL THERAPY TREATMENT NOTE   Patient Name: Eric Macdonald MRN: [id number] DOB:1955/10/31, 68 y.o., male Today's Date: 12/08/2023  END OF SESSION:  PT End of Session - 12/08/23 1234     Visit Number 5    Date for Recertification  01/17/24    Authorization Type [employer] MC dual complete- no auth required    Progress Note Due on Visit 10    PT Start Time 1231    PT Stop Time 1310    PT Time Calculation (min) 39 min    Activity Tolerance Patient tolerated treatment well    Behavior During Therapy WFL for tasks assessed/performed          Past Medical History:  Diagnosis Date   Arthritis    Cancer (HCC)    colon   Chronic low back pain    Is supposed to have surgery soon at L4-5   Diabetes mellitus without complication (HCC)    Dx'ed 09/01/22 with HgA1C 6.9   Hypertension    Knee pain    Past Surgical History:  Procedure Laterality Date   JOINT REPLACEMENT     knee 05/2011   KNEE SURGERY  08   rt arthroscopy   LAPAROSCOPIC PARTIAL COLECTOMY N/A 06/20/2020   Procedure: LAPAROSCOPIC PARTIAL COLECTOMY;  Surgeon: Debby Hila, MD;  Location: [facility];  Service: General;  Laterality: N/A;   TOTAL KNEE ARTHROPLASTY  05/05/2011   Procedure: TOTAL KNEE ARTHROPLASTY;  Surgeon: Toribio JULIANNA Chancy, MD;  Location: [facility] OR;  Service: Orthopedics;  Laterality: Right;  DR MURPHY WANTS 90 MINUTES FOR THIS CASE   Patient Active Problem List   Diagnosis Date Noted   Osteoarthritis of left knee 11/16/2023   Hepatic steatosis 06/23/2023   Morbid obesity with body mass index (BMI) of 40.0 to 44.9 in adult [facility]) 04/13/2023   Chronic pain of left knee 04/13/2023   Type 2 diabetes mellitus with other specified complication, without long-term current use of insulin (HCC) 09/01/2022   Aortic atherosclerosis 09/01/2022   B12 deficiency anemia 07/21/2020   Hypokalemia 06/21/2020   Transfusion of blood product refused for religious reason 06/21/2020   Essential (primary) hypertension     Chronic low back pain    Cancer of sigmoid colon (HCC) 06/20/2020   Insomnia 07/23/2013    PCP: [name], [name], MD  REFERRING PROVIDER: [name], [name], MD  REFERRING DIAG: [phone] (ICD-10-CM) - Osteoarthritis of left knee, unspecified osteoarthritis type  THERAPY DIAG:  Acute pain of left knee  Stiffness of left knee, not elsewhere classified  Difficulty in walking, not elsewhere classified  Muscle weakness (generalized)  Abnormal posture  Rationale for Evaluation and Treatment: Rehabilitation  ONSET DATE: 11/16/2023  SUBJECTIVE:   SUBJECTIVE STATEMENT: Patient reports that he's noticing that he's not as stiff in the mornings as he was before and he doesn't require pushing up from the arms of a chair as much with sit to stand. It's starting to work  PERTINENT HISTORY: Right TKA 9 years ago.   PAIN:  12/06/23 Are you having pain? Yes: NPRS scale: 4/10 Pain location: left knee Pain description: aching Aggravating factors: walking, inclines, steps, bending, stooping and squatting Relieving factors: rest,  Tylenol   PRECAUTIONS: None  RED FLAGS: None   WEIGHT BEARING RESTRICTIONS: No  FALLS:  Has patient fallen in last 6 months? No  LIVING ENVIRONMENT: Lives with: lives alone Lives in: House/apartment Stairs: No Has following equipment at home: None  OCCUPATION: Retired Surveyor, [employer]  PLOF: Independent, Independent with  basic ADLs, Independent with household mobility without device, Independent with community mobility without device, Independent with homemaking with ambulation, Independent with gait, and Independent with transfers  PATIENT GOALS: Trying to buy some time before needing knee replacement.    NEXT MD VISIT: prn  OBJECTIVE:  Note: Objective measures were completed at Evaluation unless otherwise noted.  DIAGNOSTIC FINDINGS: none available  PATIENT SURVEYS:  LEFS  Extreme difficulty/unable (0), Quite a bit of difficulty (1), Moderate  difficulty (2), Little difficulty (3), No difficulty (4) Survey date:  11/29/23  Any of your usual work, housework or school activities 3  2. Usual hobbies, recreational or sporting activities 2  3. Getting into/out of the bath 4  4. Walking between rooms 4  5. Putting on socks/shoes 2  6. Squatting  2  7. Lifting an object, like a bag of groceries from the floor 2  8. Performing light activities around your home 4  9. Performing heavy activities around your home 2  10. Getting into/out of a car 1  11. Walking 2 blocks 0  12. Walking 1 mile 0  13. Going up/down 10 stairs (1 flight) 3  14. Standing for 1 hour 2  15.  sitting for 1 hour 2  16. Running on even ground 0  17. Running on uneven ground 0  18. Making sharp turns while running fast 0  19. Hopping  0  20. Rolling over in bed 2  Score total:  35/80 = 43.75%      COGNITION: Overall cognitive status: Within functional limits for tasks assessed     SENSATION: WFL   POSTURE: slight knee valgus on left  PALPATION: Moderate crepitus on left on seated knee flexion/extension  LOWER EXTREMITY ROM:  WFL  LOWER EXTREMITY MMT:  Generally 4+/5 with exception of left knee extension and flexion at end ranges 4/5  LOWER EXTREMITY SPECIAL TESTS:  Knee special tests: Patellafemoral grind test: positive , Step up/down test: positive , and Patella tap test (ballotable patella): positive   FUNCTIONAL TESTS:  Eval: 5 times sit to stand: 16.30 sec Timed up and go (TUG): 10.87sec  11/29/2023: 6 minute walk test:  906 ft with RPE of 5/10  12/08/2023: 5 times sit to stand:  15.21 sec Timed up and go (TUG):  11.85 sec  GAIT: Distance walked: 30 feet Assistive device utilized: None Level of assistance: Modified independence Comments: antalgic start up and weight shifted to right                                                                                                                                TREATMENT DATE:    12/08/2023: Nustep level 5 x 6 min with PT present to discuss status and progress toward goals Hip matrix : hip ext and abduction 2 x 10 each with 45# 5 times sit to stand and TUG Seated LAQ with 6# 2 x 10 Ambulation to cancer gym and  back with 6# bilateral ankle weights x2 laps with seated recovery period in between Seated hamstring stretch 2x20 sec bilat Sit to/from stand holding 10# kettlebell x10   12/06/2023: Nustep level 5 x 6 min with PT present to discuss status and progress toward goals Hip matrix : hip ext and abduction 2 x 10 each with 45# Sit to stand x 10 with 10 lb kb Squat to table x 10 with 10 lb kb Seated LAQ with 6# 2 x 10 Step up onto 6 step 2 x 10 each LE (observed rocking to ascend on left with UE support) Lunge to BOSU fwd x 10 each LE Leg Press 2 x 10 with 125# both,  65# left  Ice to left knee x 10 min at end of session seated with heel propped on foot stool  12/01/2023: Nustep level 3 x 6 min with PT present to discuss status and progress toward goals Sit to stand x 10 with 10 lb kb Squat to table x 10 with 10 lb kb Seated LAQ with 5# 2 x 10 Step up onto 6 step x 10 Step up and hold fwd x 10 then lateral x 10 on balance pad Leg Press 2 x 10 with 125# both,  65# left  Lateral band walks with blue loop x 3 laps of 10 steps Ice to left knee x 10 min at end of session seated with heel propped on foot stool   PATIENT EDUCATION:  Education details: Initiated HEP and discussed appropriate time to do the knee replacement Person educated: Patient Education method: Explanation, Demonstration, Tactile cues, Verbal cues, and Handouts Education comprehension: verbalized understanding, returned demonstration, and verbal cues required  HOME EXERCISE PROGRAM: Access Code: [email or web address] URL: [email or web address] Date: 11/22/2023 Prepared by: Delon Haddock  Exercises - Supine Heel Slide  - 1 x daily - 7 x weekly - 3 sets - 10 reps - Seated Long Arc  Quad  - 1 x daily - 7 x weekly - 1 sets - 20 reps - Seated Knee Extension Stretch with Chair  - 1 x daily - 7 x weekly - 1 sets - 1 reps - Supine Quadricep Sets  - 1 x daily - 7 x weekly - 3 sets - 10 reps  ASSESSMENT:  CLINICAL IMPRESSION: Mr Rosten presents to skilled PT reporting that he can feel that he's getting stronger and transfers at home are getting easier.  Patient is tolerating session well and is having less difficulty with exercises.  Patient without complaints of increased pain during session.  Patient with great participation throughout session, requiring occasional seated recovery periods secondary to fatigue.  Patient with improved time noted on 5 times sit to/from stand today compared to initial evaluation. Patient continues to require skilled PT to progress towards goal related activities.  OBJECTIVE IMPAIRMENTS: decreased balance, difficulty walking, decreased ROM, decreased strength, increased edema, increased fascial restrictions, increased muscle spasms, impaired flexibility, postural dysfunction, obesity, and pain.   ACTIVITY LIMITATIONS: carrying, lifting, bending, standing, squatting, sleeping, stairs, transfers, bed mobility, bathing, toileting, and dressing  PARTICIPATION LIMITATIONS: meal prep, cleaning, laundry, driving, shopping, community activity, yard work, and church  PERSONAL FACTORS: Fitness, Past/current experiences, Profession, and 1-2 comorbidities: obesity and Type 2 diabetes are also affecting patient's functional outcome.   REHAB POTENTIAL: Fair due to end stage  CLINICAL DECISION MAKING: Evolving/moderate complexity  EVALUATION COMPLEXITY: Moderate   GOALS: Goals reviewed with patient? Yes  SHORT TERM GOALS: Target date: 12/20/2023  Pain report to be no greater  than 4/10  Baseline: Goal status: Ongoing  2.  Patient will be independent with initial HEP  Baseline:  Goal status: Met on 12/08/23   LONG TERM GOALS: Target date:  01/17/2024  Patient to report pain no greater than 2/10  Baseline:  Goal status: INITIAL  2.  Patient to be independent with advanced HEP including comprehensive gym routine Baseline:  Goal status: INITIAL  3.  Patient to be able to bend, stoop and squat with pain no greater than 2/10  Baseline:  Goal status: INITIAL  4.  Patient to be able to ascend and descend steps without pain or no greater than 2/10  Baseline:  Goal status: INITIAL  5.  LEFS score to improve by 4-5 points Baseline: 35/80 = 43.75% Goal status: INITIAL  6.  Functional tests to improve by 2-3 seconds Baseline:  Goal status: Ongoing (see above)   PLAN:  PT FREQUENCY: 1-2x/week  PT DURATION: 8 weeks  PLANNED INTERVENTIONS: 97110-Therapeutic exercises, 97530- Therapeutic activity, 97112- Neuromuscular re-education, 97535- Self Care, 02859- Manual therapy, [phone]- Gait training, [phone]- Canalith repositioning, [id number]- Aquatic Therapy, [phone]- Electrical stimulation (unattended), [phone]- Electrical stimulation (manual), [id number]- Vasopneumatic device, [id number]- Ultrasound, [id number]- Ionotophoresis 4mg /ml Dexamethasone , 79439 (1-2 muscles), [id number] (3+ muscles)- Dry Needling, Patient/Family education, Balance training, Stair training, Taping, Joint mobilization, Vestibular training, DME instructions, Cryotherapy, and Moist heat  PLAN FOR NEXT SESSION: Nustep, review/update HEP, progress quad rehab and hip strengthening   Jarrell Laming, PT, DPT 12/08/23, 1:19 PM  [facility] [address], [address] [place], [place] [address] Phone # [phone] Fax [phone]

## 2023-12-13 ENCOUNTER — Ambulatory Visit

## 2023-12-13 DIAGNOSIS — M6281 Muscle weakness (generalized): Secondary | ICD-10-CM

## 2023-12-13 DIAGNOSIS — M25562 Pain in left knee: Secondary | ICD-10-CM

## 2023-12-13 DIAGNOSIS — R252 Cramp and spasm: Secondary | ICD-10-CM

## 2023-12-13 DIAGNOSIS — R262 Difficulty in walking, not elsewhere classified: Secondary | ICD-10-CM

## 2023-12-13 DIAGNOSIS — R293 Abnormal posture: Secondary | ICD-10-CM

## 2023-12-13 DIAGNOSIS — M1712 Unilateral primary osteoarthritis, left knee: Secondary | ICD-10-CM | POA: Diagnosis not present

## 2023-12-13 DIAGNOSIS — M25662 Stiffness of left knee, not elsewhere classified: Secondary | ICD-10-CM

## 2023-12-13 NOTE — Therapy (Signed)
 OUTPATIENT PHYSICAL THERAPY TREATMENT NOTE   Patient Name: Eric Macdonald MRN: [id number] DOB:Jan 04, 1956, 68 y.o., male Today's Date: 12/13/2023  END OF SESSION:  PT End of Session - 12/13/23 1147     Visit Number 6    Date for Recertification  01/17/24    Authorization Type [employer] MC dual complete- no auth required    Authorization Time Period No auth required    Progress Note Due on Visit 10    PT Start Time 1148    PT Stop Time 1235    PT Time Calculation (min) 47 min    Activity Tolerance Patient tolerated treatment well    Behavior During Therapy WFL for tasks assessed/performed          Past Medical History:  Diagnosis Date   Arthritis    Cancer (HCC)    colon   Chronic low back pain    Is supposed to have surgery soon at L4-5   Diabetes mellitus without complication (HCC)    Dx'ed 09/01/22 with HgA1C 6.9   Hypertension    Knee pain    Past Surgical History:  Procedure Laterality Date   JOINT REPLACEMENT     knee 05/2011   KNEE SURGERY  08   rt arthroscopy   LAPAROSCOPIC PARTIAL COLECTOMY N/A 06/20/2020   Procedure: LAPAROSCOPIC PARTIAL COLECTOMY;  Surgeon: Debby Hila, MD;  Location: [facility];  Service: General;  Laterality: N/A;   TOTAL KNEE ARTHROPLASTY  05/05/2011   Procedure: TOTAL KNEE ARTHROPLASTY;  Surgeon: Toribio JULIANNA Chancy, MD;  Location: [facility] OR;  Service: Orthopedics;  Laterality: Right;  DR MURPHY WANTS 90 MINUTES FOR THIS CASE   Patient Active Problem List   Diagnosis Date Noted   Osteoarthritis of left knee 11/16/2023   Hepatic steatosis 06/23/2023   Morbid obesity with body mass index (BMI) of 40.0 to 44.9 in adult [facility]) 04/13/2023   Chronic pain of left knee 04/13/2023   Type 2 diabetes mellitus with other specified complication, without long-term current use of insulin (HCC) 09/01/2022   Aortic atherosclerosis 09/01/2022   B12 deficiency anemia 07/21/2020   Hypokalemia 06/21/2020   Transfusion of blood product refused for religious reason  06/21/2020   Essential (primary) hypertension    Chronic low back pain    Cancer of sigmoid colon (HCC) 06/20/2020   Insomnia 07/23/2013    PCP: [name], [name], MD  REFERRING PROVIDER: [name], [name], MD  REFERRING DIAG: [phone] (ICD-10-CM) - Osteoarthritis of left knee, unspecified osteoarthritis type  THERAPY DIAG:  Acute pain of left knee  Stiffness of left knee, not elsewhere classified  Abnormal posture  Cramp and spasm  Difficulty in walking, not elsewhere classified  Muscle weakness (generalized)  Rationale for Evaluation and Treatment: Rehabilitation  ONSET DATE: 11/16/2023  SUBJECTIVE:   SUBJECTIVE STATEMENT: Patient reports he is still doing fairly well.  Feeling like the therapy is paying off.    PERTINENT HISTORY: Right TKA 9 years ago.   PAIN:  12/13/23 Are you having pain? Yes: NPRS scale: 4/10 Pain location: left knee Pain description: aching Aggravating factors: walking, inclines, steps, bending, stooping and squatting Relieving factors: rest,  Tylenol   PRECAUTIONS: None  RED FLAGS: None   WEIGHT BEARING RESTRICTIONS: No  FALLS:  Has patient fallen in last 6 months? No  LIVING ENVIRONMENT: Lives with: lives alone Lives in: House/apartment Stairs: No Has following equipment at home: None  OCCUPATION: Retired Surveyor, [employer]  PLOF: Independent, Independent with basic ADLs, Independent with household mobility without device,  Independent with community mobility without device, Independent with homemaking with ambulation, Independent with gait, and Independent with transfers  PATIENT GOALS: Trying to buy some time before needing knee replacement.    NEXT MD VISIT: prn  OBJECTIVE:  Note: Objective measures were completed at Evaluation unless otherwise noted.  DIAGNOSTIC FINDINGS: none available  PATIENT SURVEYS:  LEFS  Extreme difficulty/unable (0), Quite a bit of difficulty (1), Moderate difficulty (2), Little difficulty (3), No  difficulty (4) Survey date:  11/29/23  Any of your usual work, housework or school activities 3  2. Usual hobbies, recreational or sporting activities 2  3. Getting into/out of the bath 4  4. Walking between rooms 4  5. Putting on socks/shoes 2  6. Squatting  2  7. Lifting an object, like a bag of groceries from the floor 2  8. Performing light activities around your home 4  9. Performing heavy activities around your home 2  10. Getting into/out of a car 1  11. Walking 2 blocks 0  12. Walking 1 mile 0  13. Going up/down 10 stairs (1 flight) 3  14. Standing for 1 hour 2  15.  sitting for 1 hour 2  16. Running on even ground 0  17. Running on uneven ground 0  18. Making sharp turns while running fast 0  19. Hopping  0  20. Rolling over in bed 2  Score total:  35/80 = 43.75%      COGNITION: Overall cognitive status: Within functional limits for tasks assessed     SENSATION: WFL   POSTURE: slight knee valgus on left  PALPATION: Moderate crepitus on left on seated knee flexion/extension  LOWER EXTREMITY ROM:  WFL  LOWER EXTREMITY MMT:  Generally 4+/5 with exception of left knee extension and flexion at end ranges 4/5  LOWER EXTREMITY SPECIAL TESTS:  Knee special tests: Patellafemoral grind test: positive , Step up/down test: positive , and Patella tap test (ballotable patella): positive   FUNCTIONAL TESTS:  Eval: 5 times sit to stand: 16.30 sec Timed up and go (TUG): 10.87sec  11/29/2023: 6 minute walk test:  906 ft with RPE of 5/10  12/08/2023: 5 times sit to stand:  15.21 sec Timed up and go (TUG):  11.85 sec  GAIT: Distance walked: 30 feet Assistive device utilized: None Level of assistance: Modified independence Comments: antalgic start up and weight shifted to right                                                                                                                                TREATMENT DATE:  12/13/2023: Nustep level 5 x 6 min with PT  present to discuss status and progress toward goals Hip matrix : hip ext and abduction 2 x 10 each with 45# Sit to stand x 10 with 10 lb kb Squat to table x 10 with 10 lb kb Seated LAQ x 20 with 6# Seated march x 20  with 6# Seated clam x 20 with green loop Seated hamstring curl with green tband  x 20 Lateral band walks with yellow band x 3 laps Seated LAQ with 6# 2 x 10  12/08/2023: Nustep level 5 x 6 min with PT present to discuss status and progress toward goals Hip matrix : hip ext and abduction 2 x 10 each with 45# 5 times sit to stand and TUG Seated LAQ with 6# 2 x 10 Ambulation to cancer gym and back with 6# bilateral ankle weights x2 laps with seated recovery period in between Seated hamstring stretch 2x20 sec bilat Sit to/from stand holding 10# kettlebell x10   12/06/2023: Nustep level 5 x 6 min with PT present to discuss status and progress toward goals Hip matrix : hip ext and abduction 2 x 10 each with 45# Sit to stand x 10 with 10 lb kb Squat to table x 10 with 10 lb kb Seated LAQ with 6# 2 x 10 Step up onto 6 step 2 x 10 each LE (observed rocking to ascend on left with UE support) Lunge to BOSU fwd x 10 each LE Leg Press 2 x 10 with 125# both,  65# left  Ice to left knee x 10 min at end of session seated with heel propped on foot stool  12/01/2023: Nustep level 3 x 6 min with PT present to discuss status and progress toward goals Sit to stand x 10 with 10 lb kb Squat to table x 10 with 10 lb kb Seated LAQ with 5# 2 x 10 Step up onto 6 step x 10 Step up and hold fwd x 10 then lateral x 10 on balance pad Leg Press 2 x 10 with 125# both,  65# left  Lateral band walks with blue loop x 3 laps of 10 steps Ice to left knee x 10 min at end of session seated with heel propped on foot stool   PATIENT EDUCATION:  Education details: Initiated HEP and discussed appropriate time to do the knee replacement Person educated: Patient Education method: Explanation,  Demonstration, Tactile cues, Verbal cues, and Handouts Education comprehension: verbalized understanding, returned demonstration, and verbal cues required  HOME EXERCISE PROGRAM: Access Code: [email or web address] URL: [email or web address] Date: 11/22/2023 Prepared by: Delon Haddock  Exercises - Supine Heel Slide  - 1 x daily - 7 x weekly - 3 sets - 10 reps - Seated Long Arc Quad  - 1 x daily - 7 x weekly - 1 sets - 20 reps - Seated Knee Extension Stretch with Chair  - 1 x daily - 7 x weekly - 1 sets - 1 reps - Supine Quadricep Sets  - 1 x daily - 7 x weekly - 3 sets - 10 reps  ASSESSMENT:  CLINICAL IMPRESSION: Patient is responding well to current treatment plan.  He was able to complete all tasks with mod fatigue.  He had no c/o pain with any exercises today. He is well motivated and compliant.   He should continue to do well.  Patient continues to require skilled PT to progress towards goal related activities.  OBJECTIVE IMPAIRMENTS: decreased balance, difficulty walking, decreased ROM, decreased strength, increased edema, increased fascial restrictions, increased muscle spasms, impaired flexibility, postural dysfunction, obesity, and pain.   ACTIVITY LIMITATIONS: carrying, lifting, bending, standing, squatting, sleeping, stairs, transfers, bed mobility, bathing, toileting, and dressing  PARTICIPATION LIMITATIONS: meal prep, cleaning, laundry, driving, shopping, community activity, yard work, and church  PERSONAL FACTORS: Fitness, Past/current experiences, Profession, and 1-2 comorbidities:  obesity and Type 2 diabetes are also affecting patient's functional outcome.   REHAB POTENTIAL: Fair due to end stage  CLINICAL DECISION MAKING: Evolving/moderate complexity  EVALUATION COMPLEXITY: Moderate   GOALS: Goals reviewed with patient? Yes  SHORT TERM GOALS: Target date: 12/20/2023  Pain report to be no greater than 4/10  Baseline: Goal status: Ongoing  2.  Patient will  be independent with initial HEP  Baseline:  Goal status: Met on 12/08/23   LONG TERM GOALS: Target date: 01/17/2024  Patient to report pain no greater than 2/10  Baseline:  Goal status: INITIAL  2.  Patient to be independent with advanced HEP including comprehensive gym routine Baseline:  Goal status: INITIAL  3.  Patient to be able to bend, stoop and squat with pain no greater than 2/10  Baseline:  Goal status: INITIAL  4.  Patient to be able to ascend and descend steps without pain or no greater than 2/10  Baseline:  Goal status: INITIAL  5.  LEFS score to improve by 4-5 points Baseline: 35/80 = 43.75% Goal status: INITIAL  6.  Functional tests to improve by 2-3 seconds Baseline:  Goal status: Ongoing (see above)   PLAN:  PT FREQUENCY: 1-2x/week  PT DURATION: 8 weeks  PLANNED INTERVENTIONS: 97110-Therapeutic exercises, 97530- Therapeutic activity, [id number]- Neuromuscular re-education, 97535- Self Care, 02859- Manual therapy, [id number]- Gait training, [phone]- Canalith repositioning, [id number]- Aquatic Therapy, [phone]- Electrical stimulation (unattended), [phone]- Electrical stimulation (manual), [id number]- Vasopneumatic device, [id number]- Ultrasound, [id number]- Ionotophoresis 4mg /ml Dexamethasone , 79439 (1-2 muscles), 20561 (3+ muscles)- Dry Needling, Patient/Family education, Balance training, Stair training, Taping, Joint mobilization, Vestibular training, DME instructions, Cryotherapy, and Moist heat  PLAN FOR NEXT SESSION: Nustep, review/update HEP, progress quad rehab and hip strengthening   Jayel Inks B. Kunal Levario, PT 12/13/23 9:05 PM [facility] [address] [place], [place] [address] Phone # [phone] Fax [phone]

## 2023-12-14 ENCOUNTER — Ambulatory Visit

## 2023-12-15 ENCOUNTER — Ambulatory Visit: Attending: Family Medicine | Admitting: Physical Therapy

## 2023-12-15 ENCOUNTER — Encounter: Payer: Self-pay | Admitting: Physical Therapy

## 2023-12-15 DIAGNOSIS — M25662 Stiffness of left knee, not elsewhere classified: Secondary | ICD-10-CM | POA: Insufficient documentation

## 2023-12-15 DIAGNOSIS — M25562 Pain in left knee: Secondary | ICD-10-CM | POA: Insufficient documentation

## 2023-12-15 DIAGNOSIS — R293 Abnormal posture: Secondary | ICD-10-CM | POA: Diagnosis present

## 2023-12-15 DIAGNOSIS — M6281 Muscle weakness (generalized): Secondary | ICD-10-CM | POA: Diagnosis present

## 2023-12-15 DIAGNOSIS — R252 Cramp and spasm: Secondary | ICD-10-CM | POA: Insufficient documentation

## 2023-12-15 DIAGNOSIS — R262 Difficulty in walking, not elsewhere classified: Secondary | ICD-10-CM | POA: Diagnosis present

## 2023-12-15 NOTE — Therapy (Signed)
 OUTPATIENT PHYSICAL THERAPY TREATMENT NOTE   Patient Name: Eric Macdonald MRN: [id number] DOB:[birth year], 68 y.o., male Today's Date: 12/15/2023  END OF SESSION:  PT End of Session - 12/15/23 1143     Visit Number 7    Date for Recertification  01/17/24    Authorization Type [employer] MC dual complete- no auth required    Authorization Time Period No auth required    Progress Note Due on Visit 10    PT Start Time 1144    PT Stop Time 1235    PT Time Calculation (min) 51 min    Activity Tolerance Patient tolerated treatment well    Behavior During Therapy WFL for tasks assessed/performed          Past Medical History:  Diagnosis Date   Arthritis    Cancer (HCC)    colon   Chronic low back pain    Is supposed to have surgery soon at L4-5   Diabetes mellitus without complication (HCC)    Dx'ed 09/01/22 with HgA1C 6.9   Hypertension    Knee pain    Past Surgical History:  Procedure Laterality Date   JOINT REPLACEMENT     knee 05/2011   KNEE SURGERY  08   rt arthroscopy   LAPAROSCOPIC PARTIAL COLECTOMY N/A 06/20/2020   Procedure: LAPAROSCOPIC PARTIAL COLECTOMY;  Surgeon: Debby Hila, MD;  Location: [facility];  Service: General;  Laterality: N/A;   TOTAL KNEE ARTHROPLASTY  05/05/2011   Procedure: TOTAL KNEE ARTHROPLASTY;  Surgeon: Toribio JULIANNA Chancy, MD;  Location: [facility] OR;  Service: Orthopedics;  Laterality: Right;  DR MURPHY WANTS 90 MINUTES FOR THIS CASE   Patient Active Problem List   Diagnosis Date Noted   Osteoarthritis of left knee 11/16/2023   Hepatic steatosis 06/23/2023   Morbid obesity with body mass index (BMI) of 40.0 to 44.9 in adult [facility]) 04/13/2023   Chronic pain of left knee 04/13/2023   Type 2 diabetes mellitus with other specified complication, without long-term current use of insulin (HCC) 09/01/2022   Aortic atherosclerosis 09/01/2022   B12 deficiency anemia 07/21/2020   Hypokalemia 06/21/2020   Transfusion of blood product refused for religious reason  06/21/2020   Essential (primary) hypertension    Chronic low back pain    Cancer of sigmoid colon (HCC) 06/20/2020   Insomnia 07/23/2013    PCP: [name], [name], MD  REFERRING PROVIDER: [name], [name], MD  REFERRING DIAG: [phone] (ICD-10-CM) - Osteoarthritis of left knee, unspecified osteoarthritis type  THERAPY DIAG:  Acute pain of left knee  Stiffness of left knee, not elsewhere classified  Abnormal posture  Cramp and spasm  Difficulty in walking, not elsewhere classified  Muscle weakness (generalized)  Rationale for Evaluation and Treatment: Rehabilitation  ONSET DATE: 11/16/2023  SUBJECTIVE:   SUBJECTIVE STATEMENT: My pain level is consistent around a 4/10.  I can tell I'm getting stronger in the knee and my core and I can tell I am standing up better with less pulling on the arm rests.  PERTINENT HISTORY: Right TKA 9 years ago.   PAIN:  12/13/23 Are you having pain? Yes: NPRS scale: 4/10 Pain location: left knee Pain description: aching Aggravating factors: walking, inclines, steps, bending, stooping and squatting Relieving factors: rest,  Tylenol   PRECAUTIONS: None  RED FLAGS: None   WEIGHT BEARING RESTRICTIONS: No  FALLS:  Has patient fallen in last 6 months? No  LIVING ENVIRONMENT: Lives with: lives alone Lives in: House/apartment Stairs: No Has following equipment at  home: None  OCCUPATION: Retired Surveyor, [employer]  PLOF: Independent, Independent with basic ADLs, Independent with household mobility without device, Independent with community mobility without device, Independent with homemaking with ambulation, Independent with gait, and Independent with transfers  PATIENT GOALS: Trying to buy some time before needing knee replacement.    NEXT MD VISIT: prn  OBJECTIVE:  Note: Objective measures were completed at Evaluation unless otherwise noted.  DIAGNOSTIC FINDINGS: none available  PATIENT SURVEYS:  LEFS  Extreme difficulty/unable (0),  Quite a bit of difficulty (1), Moderate difficulty (2), Little difficulty (3), No difficulty (4) Survey date:  11/29/23  Any of your usual work, housework or school activities 3  2. Usual hobbies, recreational or sporting activities 2  3. Getting into/out of the bath 4  4. Walking between rooms 4  5. Putting on socks/shoes 2  6. Squatting  2  7. Lifting an object, like a bag of groceries from the floor 2  8. Performing light activities around your home 4  9. Performing heavy activities around your home 2  10. Getting into/out of a car 1  11. Walking 2 blocks 0  12. Walking 1 mile 0  13. Going up/down 10 stairs (1 flight) 3  14. Standing for 1 hour 2  15.  sitting for 1 hour 2  16. Running on even ground 0  17. Running on uneven ground 0  18. Making sharp turns while running fast 0  19. Hopping  0  20. Rolling over in bed 2  Score total:  35/80 = 43.75%      COGNITION: Overall cognitive status: Within functional limits for tasks assessed     SENSATION: WFL   POSTURE: slight knee valgus on left  PALPATION: Moderate crepitus on left on seated knee flexion/extension  LOWER EXTREMITY ROM:  WFL  LOWER EXTREMITY MMT:  Generally 4+/5 with exception of left knee extension and flexion at end ranges 4/5  LOWER EXTREMITY SPECIAL TESTS:  Knee special tests: Patellafemoral grind test: positive , Step up/down test: positive , and Patella tap test (ballotable patella): positive   FUNCTIONAL TESTS:  Eval: 5 times sit to stand: 16.30 sec Timed up and go (TUG): 10.87sec  11/29/2023: 6 minute walk test:  906 ft with RPE of 5/10  12/08/2023: 5 times sit to stand:  15.21 sec Timed up and go (TUG):  11.85 sec  GAIT: Distance walked: 30 feet Assistive device utilized: None Level of assistance: Modified independence Comments: antalgic start up and weight shifted to right                                                                                                                                 TREATMENT DATE:  12/15/23 Nustep L5 x 6' with PT present to discuss status Seated alt LE march x20 6lb chair + pad Seated LAQ x 20 with 6# Hip matrix : hip ext and abduction 2 x 10 each with 45#  Sit to stand from chair 10lb kbell, then squat to chair 10lb kbell - 10x each Seated hamstring stretch 2x20 sec bilat Seated hamstring curl with green tband x 20 Pt requested to try squats with 15lb kbell - 7 reps, then 4 reps Ice to left knee x 10 min at end of session  12/13/2023: Nustep level 5 x 6 min with PT present to discuss status and progress toward goals Hip matrix : hip ext and abduction 2 x 10 each with 45# Sit to stand x 10 with 10 lb kb Squat to table x 10 with 10 lb kb Seated LAQ x 20 with 6# Seated march x 20 with 6# Seated clam x 20 with green loop Seated hamstring curl with green tband  x 20 Lateral band walks with yellow band x 3 laps Seated LAQ with 6# 2 x 10  12/08/2023: Nustep level 5 x 6 min with PT present to discuss status and progress toward goals Hip matrix : hip ext and abduction 2 x 10 each with 45# 5 times sit to stand and TUG Seated LAQ with 6# 2 x 10 Ambulation to cancer gym and back with 6# bilateral ankle weights x2 laps with seated recovery period in between Seated hamstring stretch 2x20 sec bilat Sit to/from stand holding 10# kettlebell x10   12/06/2023: Nustep level 5 x 6 min with PT present to discuss status and progress toward goals Hip matrix : hip ext and abduction 2 x 10 each with 45# Sit to stand x 10 with 10 lb kb Squat to table x 10 with 10 lb kb Seated LAQ with 6# 2 x 10 Step up onto 6 step 2 x 10 each LE (observed rocking to ascend on left with UE support) Lunge to BOSU fwd x 10 each LE Leg Press 2 x 10 with 125# both,  65# left  Ice to left knee x 10 min at end of session seated with heel propped on foot stoo   PATIENT EDUCATION:  Education details: Initiated HEP and discussed appropriate time to do the knee  replacement Person educated: Patient Education method: Explanation, Demonstration, Tactile cues, Verbal cues, and Handouts Education comprehension: verbalized understanding, returned demonstration, and verbal cues required  HOME EXERCISE PROGRAM: Access Code: [email or web address] URL: [email or web address] Date: 11/22/2023 Prepared by: Delon Haddock  Exercises - Supine Heel Slide  - 1 x daily - 7 x weekly - 3 sets - 10 reps - Seated Long Arc Quad  - 1 x daily - 7 x weekly - 1 sets - 20 reps - Seated Knee Extension Stretch with Chair  - 1 x daily - 7 x weekly - 1 sets - 1 reps - Supine Quadricep Sets  - 1 x daily - 7 x weekly - 3 sets - 10 reps  ASSESSMENT:  CLINICAL IMPRESSION: Patient is doing very well with strengthening regimen.  He reports feeling stronger and was able to push his squats with increased weight today.  His strength is a consistent 4/10.    OBJECTIVE IMPAIRMENTS: decreased balance, difficulty walking, decreased ROM, decreased strength, increased edema, increased fascial restrictions, increased muscle spasms, impaired flexibility, postural dysfunction, obesity, and pain.   ACTIVITY LIMITATIONS: carrying, lifting, bending, standing, squatting, sleeping, stairs, transfers, bed mobility, bathing, toileting, and dressing  PARTICIPATION LIMITATIONS: meal prep, cleaning, laundry, driving, shopping, community activity, yard work, and church  PERSONAL FACTORS: Fitness, Past/current experiences, Profession, and 1-2 comorbidities: obesity and Type 2 diabetes are also affecting patient's functional outcome.   REHAB POTENTIAL:  Fair due to end stage  CLINICAL DECISION MAKING: Evolving/moderate complexity  EVALUATION COMPLEXITY: Moderate   GOALS: Goals reviewed with patient? Yes  SHORT TERM GOALS: Target date: 12/20/2023  Pain report to be no greater than 4/10  Baseline: Goal status: Ongoing  2.  Patient will be independent with initial HEP  Baseline:  Goal  status: Met on 12/08/23   LONG TERM GOALS: Target date: 01/17/2024  Patient to report pain no greater than 2/10  Baseline:  Goal status: INITIAL  2.  Patient to be independent with advanced HEP including comprehensive gym routine Baseline:  Goal status: INITIAL  3.  Patient to be able to bend, stoop and squat with pain no greater than 2/10  Baseline:  Goal status: INITIAL  4.  Patient to be able to ascend and descend steps without pain or no greater than 2/10  Baseline:  Goal status: INITIAL  5.  LEFS score to improve by 4-5 points Baseline: 35/80 = 43.75% Goal status: INITIAL  6.  Functional tests to improve by 2-3 seconds Baseline:  Goal status: Ongoing (see above)   PLAN:  PT FREQUENCY: 1-2x/week  PT DURATION: 8 weeks  PLANNED INTERVENTIONS: 97110-Therapeutic exercises, 97530- Therapeutic activity, 97112- Neuromuscular re-education, 97535- Self Care, 02859- Manual therapy, [phone]- Gait training, [phone]- Canalith repositioning, [id number]- Aquatic Therapy, [phone]- Electrical stimulation (unattended), [phone]- Electrical stimulation (manual), [id number]- Vasopneumatic device, [id number]- Ultrasound, [id number]- Ionotophoresis 4mg /ml Dexamethasone , 79439 (1-2 muscles), [id number] (3+ muscles)- Dry Needling, Patient/Family education, Balance training, Stair training, Taping, Joint mobilization, Vestibular training, DME instructions, Cryotherapy, and Moist heat  PLAN FOR NEXT SESSION: Nustep, review/update HEP, progress quad rehab and hip strengthening   Orvil Fester, PT 12/15/23 12:28 PM  [facility] [address] [place], [place] [address] Phone # [phone] Fax [phone]

## 2023-12-20 ENCOUNTER — Ambulatory Visit

## 2023-12-20 DIAGNOSIS — R252 Cramp and spasm: Secondary | ICD-10-CM

## 2023-12-20 DIAGNOSIS — R293 Abnormal posture: Secondary | ICD-10-CM

## 2023-12-20 DIAGNOSIS — M25562 Pain in left knee: Secondary | ICD-10-CM

## 2023-12-20 DIAGNOSIS — M25662 Stiffness of left knee, not elsewhere classified: Secondary | ICD-10-CM

## 2023-12-20 DIAGNOSIS — R262 Difficulty in walking, not elsewhere classified: Secondary | ICD-10-CM

## 2023-12-20 DIAGNOSIS — M6281 Muscle weakness (generalized): Secondary | ICD-10-CM

## 2023-12-20 NOTE — Therapy (Unsigned)
 OUTPATIENT PHYSICAL THERAPY TREATMENT NOTE   Patient Name: Eric Macdonald MRN: [id number] DOB:1956/01/22, 68 y.o., male Today's Date: 12/21/2023  END OF SESSION:  PT End of Session - 12/20/23 1238     Visit Number 8    Date for Recertification  01/17/24    Authorization Type [employer] MC dual complete- no auth required    Authorization Time Period No auth required    PT Start Time 1145    PT Stop Time 1244    PT Time Calculation (min) 59 min    Activity Tolerance Patient tolerated treatment well    Behavior During Therapy WFL for tasks assessed/performed           Past Medical History:  Diagnosis Date   Arthritis    Cancer (HCC)    colon   Chronic low back pain    Is supposed to have surgery soon at L4-5   Diabetes mellitus without complication (HCC)    Dx'ed 09/01/22 with HgA1C 6.9   Hypertension    Knee pain    Past Surgical History:  Procedure Laterality Date   JOINT REPLACEMENT     knee 05/2011   KNEE SURGERY  08   rt arthroscopy   LAPAROSCOPIC PARTIAL COLECTOMY N/A 06/20/2020   Procedure: LAPAROSCOPIC PARTIAL COLECTOMY;  Surgeon: Debby Hila, MD;  Location: [facility];  Service: General;  Laterality: N/A;   TOTAL KNEE ARTHROPLASTY  05/05/2011   Procedure: TOTAL KNEE ARTHROPLASTY;  Surgeon: Toribio JULIANNA Chancy, MD;  Location: [facility] OR;  Service: Orthopedics;  Laterality: Right;  DR MURPHY WANTS 90 MINUTES FOR THIS CASE   Patient Active Problem List   Diagnosis Date Noted   Osteoarthritis of left knee 11/16/2023   Hepatic steatosis 06/23/2023   Morbid obesity with body mass index (BMI) of 40.0 to 44.9 in adult [facility]) 04/13/2023   Chronic pain of left knee 04/13/2023   Type 2 diabetes mellitus with other specified complication, without long-term current use of insulin (HCC) 09/01/2022   Aortic atherosclerosis 09/01/2022   B12 deficiency anemia 07/21/2020   Hypokalemia 06/21/2020   Transfusion of blood product refused for religious reason 06/21/2020   Essential (primary)  hypertension    Chronic low back pain    Cancer of sigmoid colon (HCC) 06/20/2020   Insomnia 07/23/2013    PCP: [name], [name], MD  REFERRING PROVIDER: [name], [name], MD  REFERRING DIAG: [phone] (ICD-10-CM) - Osteoarthritis of left knee, unspecified osteoarthritis type  THERAPY DIAG:  Acute pain of left knee  Stiffness of left knee, not elsewhere classified  Abnormal posture  Difficulty in walking, not elsewhere classified  Cramp and spasm  Muscle weakness (generalized)  Rationale for Evaluation and Treatment: Rehabilitation  ONSET DATE: 11/16/2023  SUBJECTIVE:   SUBJECTIVE STATEMENT: No new complaints.  I feel stronger.   PERTINENT HISTORY: Right TKA 9 years ago.   PAIN:  12/21/23 Are you having pain? Yes: NPRS scale: 4/10 Pain location: left knee Pain description: aching Aggravating factors: walking, inclines, steps, bending, stooping and squatting Relieving factors: rest,  Tylenol   PRECAUTIONS: None  RED FLAGS: None   WEIGHT BEARING RESTRICTIONS: No  FALLS:  Has patient fallen in last 6 months? No  LIVING ENVIRONMENT: Lives with: lives alone Lives in: House/apartment Stairs: No Has following equipment at home: None  OCCUPATION: Retired Surveyor, [employer]  PLOF: Independent, Independent with basic ADLs, Independent with household mobility without device, Independent with community mobility without device, Independent with homemaking with ambulation, Independent with gait, and Independent with transfers  PATIENT GOALS: Trying to buy some time before needing knee replacement.    NEXT MD VISIT: prn  OBJECTIVE:  Note: Objective measures were completed at Evaluation unless otherwise noted.  DIAGNOSTIC FINDINGS: none available  PATIENT SURVEYS:  LEFS  Extreme difficulty/unable (0), Quite a bit of difficulty (1), Moderate difficulty (2), Little difficulty (3), No difficulty (4) Survey date:  11/29/23  Any of your usual work, housework or school  activities 3  2. Usual hobbies, recreational or sporting activities 2  3. Getting into/out of the bath 4  4. Walking between rooms 4  5. Putting on socks/shoes 2  6. Squatting  2  7. Lifting an object, like a bag of groceries from the floor 2  8. Performing light activities around your home 4  9. Performing heavy activities around your home 2  10. Getting into/out of a car 1  11. Walking 2 blocks 0  12. Walking 1 mile 0  13. Going up/down 10 stairs (1 flight) 3  14. Standing for 1 hour 2  15.  sitting for 1 hour 2  16. Running on even ground 0  17. Running on uneven ground 0  18. Making sharp turns while running fast 0  19. Hopping  0  20. Rolling over in bed 2  Score total:  35/80 = 43.75%      COGNITION: Overall cognitive status: Within functional limits for tasks assessed     SENSATION: WFL   POSTURE: slight knee valgus on left  PALPATION: Moderate crepitus on left on seated knee flexion/extension  LOWER EXTREMITY ROM:  WFL  LOWER EXTREMITY MMT:  Generally 4+/5 with exception of left knee extension and flexion at end ranges 4/5  LOWER EXTREMITY SPECIAL TESTS:  Knee special tests: Patellafemoral grind test: positive , Step up/down test: positive , and Patella tap test (ballotable patella): positive   FUNCTIONAL TESTS:  Eval: 5 times sit to stand: 16.30 sec Timed up and go (TUG): 10.87sec  11/29/2023: 6 minute walk test:  906 ft with RPE of 5/10  12/08/2023: 5 times sit to stand:  15.21 sec Timed up and go (TUG):  11.85 sec  GAIT: Distance walked: 30 feet Assistive device utilized: None Level of assistance: Modified independence Comments: antalgic start up and weight shifted to right                                                                                                                                TREATMENT DATE:  12/20/23 Nustep L5 x 6' with PT present to discuss status Sit to stand with 15lb kbell x 10  Squats with 15lb kbell - x  10 Standing hip matrix: hip abduction and extension 2 x 10 45# Seated alt LE march x20 6lb chair + pad Seated LAQ x 20 with 6# Hip matrix : hip ext and abduction 2 x 10 each with 45# Ice to left knee x 10 min at end of session  12/15/23 Nustep L5 x 6' with PT present to discuss status Seated alt LE march x20 6lb chair + pad Seated LAQ x 20 with 6# Hip matrix : hip ext and abduction 2 x 10 each with 45# Sit to stand from chair 10lb kbell, then squat to chair 10lb kbell - 10x each Seated hamstring stretch 2x20 sec bilat Seated hamstring curl with green tband x 20 Pt requested to try squats with 15lb kbell - 7 reps, then 4 reps Ice to left knee x 10 min at end of session  12/13/2023: Nustep level 5 x 6 min with PT present to discuss status and progress toward goals Hip matrix : hip ext and abduction 2 x 10 each with 45# Sit to stand x 10 with 10 lb kb Squat to table x 10 with 10 lb kb Seated LAQ x 20 with 6# Seated march x 20 with 6# Seated clam x 20 with green loop Seated hamstring curl with green tband  x 20 Lateral band walks with yellow band x 3 laps Seated LAQ with 6# 2 x 10  12/08/2023: Nustep level 5 x 6 min with PT present to discuss status and progress toward goals Hip matrix : hip ext and abduction 2 x 10 each with 45# 5 times sit to stand and TUG Seated LAQ with 6# 2 x 10 Ambulation to cancer gym and back with 6# bilateral ankle weights x2 laps with seated recovery period in between Seated hamstring stretch 2x20 sec bilat Sit to/from stand holding 10# kettlebell x10   12/06/2023: Nustep level 5 x 6 min with PT present to discuss status and progress toward goals Hip matrix : hip ext and abduction 2 x 10 each with 45# Sit to stand x 10 with 10 lb kb Squat to table x 10 with 10 lb kb Seated LAQ with 6# 2 x 10 Step up onto 6 step 2 x 10 each LE (observed rocking to ascend on left with UE support) Lunge to BOSU fwd x 10 each LE Leg Press 2 x 10 with 125# both,  65#  left  Ice to left knee x 10 min at end of session seated with heel propped on foot stoo   PATIENT EDUCATION:  Education details: Initiated HEP and discussed appropriate time to do the knee replacement Person educated: Patient Education method: Explanation, Demonstration, Tactile cues, Verbal cues, and Handouts Education comprehension: verbalized understanding, returned demonstration, and verbal cues required  HOME EXERCISE PROGRAM: Access Code: [email or web address] URL: [email or web address] Date: 11/22/2023 Prepared by: Delon Haddock  Exercises - Supine Heel Slide  - 1 x daily - 7 x weekly - 3 sets - 10 reps - Seated Long Arc Quad  - 1 x daily - 7 x weekly - 1 sets - 20 reps - Seated Knee Extension Stretch with Chair  - 1 x daily - 7 x weekly - 1 sets - 1 reps - Supine Quadricep Sets  - 1 x daily - 7 x weekly - 3 sets - 10 reps  ASSESSMENT:  CLINICAL IMPRESSION: Patient is doing very well with strengthening regimen.  He reports feeling stronger and was able to push his squats with increased weight today.  His strength is a consistent 4/10.    OBJECTIVE IMPAIRMENTS: decreased balance, difficulty walking, decreased ROM, decreased strength, increased edema, increased fascial restrictions, increased muscle spasms, impaired flexibility, postural dysfunction, obesity, and pain.   ACTIVITY LIMITATIONS: carrying, lifting, bending, standing, squatting, sleeping, stairs, transfers, bed mobility, bathing, toileting, and dressing  PARTICIPATION LIMITATIONS: meal prep, cleaning, laundry, driving, shopping, community activity, yard work, and church  PERSONAL FACTORS: Fitness, Past/current experiences, Profession, and 1-2 comorbidities: obesity and Type 2 diabetes are also affecting patient's functional outcome.   REHAB POTENTIAL: Fair due to end stage  CLINICAL DECISION MAKING: Evolving/moderate complexity  EVALUATION COMPLEXITY: Moderate   GOALS: Goals reviewed with patient?  Yes  SHORT TERM GOALS: Target date: 12/20/2023  Pain report to be no greater than 4/10  Baseline: Goal status: Ongoing  2.  Patient will be independent with initial HEP  Baseline:  Goal status: Met on 12/08/23   LONG TERM GOALS: Target date: 01/17/2024  Patient to report pain no greater than 2/10  Baseline:  Goal status: INITIAL  2.  Patient to be independent with advanced HEP including comprehensive gym routine Baseline:  Goal status: INITIAL  3.  Patient to be able to bend, stoop and squat with pain no greater than 2/10  Baseline:  Goal status: INITIAL  4.  Patient to be able to ascend and descend steps without pain or no greater than 2/10  Baseline:  Goal status: INITIAL  5.  LEFS score to improve by 4-5 points Baseline: 35/80 = 43.75% Goal status: INITIAL  6.  Functional tests to improve by 2-3 seconds Baseline:  Goal status: Ongoing (see above)   PLAN:  PT FREQUENCY: 1-2x/week  PT DURATION: 8 weeks  PLANNED INTERVENTIONS: 97110-Therapeutic exercises, 97530- Therapeutic activity, [id number]- Neuromuscular re-education, 97535- Self Care, 02859- Manual therapy, [id number]- Gait training, [id number]- Canalith repositioning, [id number]- Aquatic Therapy, [phone]- Electrical stimulation (unattended), [phone]- Electrical stimulation (manual), [id number]- Vasopneumatic device, [id number]- Ultrasound, [id number]- Ionotophoresis 4mg /ml Dexamethasone , 79439 (1-2 muscles), 20561 (3+ muscles)- Dry Needling, Patient/Family education, Balance training, Stair training, Taping, Joint mobilization, Vestibular training, DME instructions, Cryotherapy, and Moist heat  PLAN FOR NEXT SESSION: Nustep, progress quad rehab and hip strengthening   Edi Gorniak B. Lanier Felty, PT 12/21/23 8:04 AM [facility] [address], [address] [place], [place] [address] Phone # [phone] Fax [phone]

## 2023-12-22 ENCOUNTER — Ambulatory Visit

## 2023-12-22 DIAGNOSIS — M25562 Pain in left knee: Secondary | ICD-10-CM

## 2023-12-22 DIAGNOSIS — R262 Difficulty in walking, not elsewhere classified: Secondary | ICD-10-CM

## 2023-12-22 DIAGNOSIS — M6281 Muscle weakness (generalized): Secondary | ICD-10-CM

## 2023-12-22 DIAGNOSIS — R293 Abnormal posture: Secondary | ICD-10-CM

## 2023-12-22 DIAGNOSIS — M25662 Stiffness of left knee, not elsewhere classified: Secondary | ICD-10-CM

## 2023-12-22 NOTE — Therapy (Signed)
 OUTPATIENT PHYSICAL THERAPY TREATMENT NOTE   Patient Name: Dorian Duval MRN: [id number] DOB:04/22/1955, 68 y.o., male Today's Date: 12/22/2023  END OF SESSION:  PT End of Session - 12/22/23 1739     Visit Number 9    Date for Recertification  01/17/24    Authorization Type [employer] MC dual complete- no auth required    Authorization Time Period No auth required    PT Start Time 1148    PT Stop Time 1235    PT Time Calculation (min) 47 min            Past Medical History:  Diagnosis Date   Arthritis    Cancer (HCC)    colon   Chronic low back pain    Is supposed to have surgery soon at L4-5   Diabetes mellitus without complication (HCC)    Dx'ed 09/01/22 with HgA1C 6.9   Hypertension    Knee pain    Past Surgical History:  Procedure Laterality Date   JOINT REPLACEMENT     knee 05/2011   KNEE SURGERY  08   rt arthroscopy   LAPAROSCOPIC PARTIAL COLECTOMY N/A 06/20/2020   Procedure: LAPAROSCOPIC PARTIAL COLECTOMY;  Surgeon: Debby Hila, MD;  Location: [facility];  Service: General;  Laterality: N/A;   TOTAL KNEE ARTHROPLASTY  05/05/2011   Procedure: TOTAL KNEE ARTHROPLASTY;  Surgeon: Toribio JULIANNA Chancy, MD;  Location: [facility] OR;  Service: Orthopedics;  Laterality: Right;  DR MURPHY WANTS 90 MINUTES FOR THIS CASE   Patient Active Problem List   Diagnosis Date Noted   Osteoarthritis of left knee 11/16/2023   Hepatic steatosis 06/23/2023   Morbid obesity with body mass index (BMI) of 40.0 to 44.9 in adult [facility] 04/13/2023   Chronic pain of left knee 04/13/2023   Type 2 diabetes mellitus with other specified complication, without long-term current use of insulin (HCC) 09/01/2022   Aortic atherosclerosis 09/01/2022   B12 deficiency anemia 07/21/2020   Hypokalemia 06/21/2020   Transfusion of blood product refused for religious reason 06/21/2020   Essential (primary) hypertension    Chronic low back pain    Cancer of sigmoid colon (HCC) 06/20/2020   Insomnia 07/23/2013     PCP: [name], [name], MD  REFERRING PROVIDER: [name], [name], MD  REFERRING DIAG: [phone] (ICD-10-CM) - Osteoarthritis of left knee, unspecified osteoarthritis type  THERAPY DIAG:  Stiffness of left knee, not elsewhere classified  Acute pain of left knee  Abnormal posture  Difficulty in walking, not elsewhere classified  Muscle weakness (generalized)  Rationale for Evaluation and Treatment: Rehabilitation  ONSET DATE: 11/16/2023  SUBJECTIVE:   SUBJECTIVE STATEMENT: Patient reports pain at 4/10.   He states that he has to get up and go to the bathroom several times per night due to taking diuretic.    PERTINENT HISTORY: Right TKA 9 years ago.   PAIN:  12/22/23 Are you having pain? Yes: NPRS scale: 4/10 Pain location: left knee Pain description: aching Aggravating factors: walking, inclines, steps, bending, stooping and squatting Relieving factors: rest,  Tylenol   PRECAUTIONS: None  RED FLAGS: None   WEIGHT BEARING RESTRICTIONS: No  FALLS:  Has patient fallen in last 6 months? No  LIVING ENVIRONMENT: Lives with: lives alone Lives in: House/apartment Stairs: No Has following equipment at home: None  OCCUPATION: Retired Surveyor, [employer]  PLOF: Independent, Independent with basic ADLs, Independent with household mobility without device, Independent with community mobility without device, Independent with homemaking with ambulation, Independent with gait, and Independent with transfers  PATIENT GOALS: Trying to buy some time before needing knee replacement.    NEXT MD VISIT: prn  OBJECTIVE:  Note: Objective measures were completed at Evaluation unless otherwise noted.  DIAGNOSTIC FINDINGS: none available  PATIENT SURVEYS:  LEFS  Extreme difficulty/unable (0), Quite a bit of difficulty (1), Moderate difficulty (2), Little difficulty (3), No difficulty (4) Survey date:  11/29/23  Any of your usual work, housework or school activities 3  2. Usual hobbies,  recreational or sporting activities 2  3. Getting into/out of the bath 4  4. Walking between rooms 4  5. Putting on socks/shoes 2  6. Squatting  2  7. Lifting an object, like a bag of groceries from the floor 2  8. Performing light activities around your home 4  9. Performing heavy activities around your home 2  10. Getting into/out of a car 1  11. Walking 2 blocks 0  12. Walking 1 mile 0  13. Going up/down 10 stairs (1 flight) 3  14. Standing for 1 hour 2  15.  sitting for 1 hour 2  16. Running on even ground 0  17. Running on uneven ground 0  18. Making sharp turns while running fast 0  19. Hopping  0  20. Rolling over in bed 2  Score total:  35/80 = 43.75%      COGNITION: Overall cognitive status: Within functional limits for tasks assessed     SENSATION: WFL   POSTURE: slight knee valgus on left  PALPATION: Moderate crepitus on left on seated knee flexion/extension  LOWER EXTREMITY ROM:  WFL  LOWER EXTREMITY MMT:  Generally 4+/5 with exception of left knee extension and flexion at end ranges 4/5  LOWER EXTREMITY SPECIAL TESTS:  Knee special tests: Patellafemoral grind test: positive , Step up/down test: positive , and Patella tap test (ballotable patella): positive   FUNCTIONAL TESTS:  Eval: 5 times sit to stand: 16.30 sec Timed up and go (TUG): 10.87sec  11/29/2023: 6 minute walk test:  906 ft with RPE of 5/10  12/08/2023: 5 times sit to stand:  15.21 sec Timed up and go (TUG):  11.85 sec  GAIT: Distance walked: 30 feet Assistive device utilized: None Level of assistance: Modified independence Comments: antalgic start up and weight shifted to right                                                                                                                                TREATMENT DATE:  12/22/23 Nustep L5 x 6' with PT present to discuss status Sit to stand with 15lb kbell x 10  Squats with 15lb kbell - x 10 Standing hip matrix: hip abduction  and extension 2 x 10 45# Seated alt LE march x20 6lb chair + pad Seated LAQ x 20 with 6# Patient was quite talkative today- unable to do further treatment  12/20/23 Nustep L5 x 6' with PT present to discuss status Sit to  stand with 15lb kbell x 10  Squats with 15lb kbell - x 10 Standing hip matrix: hip abduction and extension 2 x 10 45# Seated alt LE march x20 6lb chair + pad Seated LAQ x 20 with 6# Hip matrix : hip ext and abduction 2 x 10 each with 45# Ice to left knee x 10 min at end of session  12/15/23 Nustep L5 x 6' with PT present to discuss status Seated alt LE march x20 6lb chair + pad Seated LAQ x 20 with 6# Hip matrix : hip ext and abduction 2 x 10 each with 45# Sit to stand from chair 10lb kbell, then squat to chair 10lb kbell - 10x each Seated hamstring stretch 2x20 sec bilat Seated hamstring curl with green tband x 20 Pt requested to try squats with 15lb kbell - 7 reps, then 4 reps Ice to left knee x 10 min at end of session   PATIENT EDUCATION:  Education details: Initiated HEP and discussed appropriate time to do the knee replacement Person educated: Patient Education method: Explanation, Demonstration, Tactile cues, Verbal cues, and Handouts Education comprehension: verbalized understanding, returned demonstration, and verbal cues required  HOME EXERCISE PROGRAM: Access Code: [email or web address] URL: [email or web address] Date: 11/22/2023 Prepared by: Delon Haddock  Exercises - Supine Heel Slide  - 1 x daily - 7 x weekly - 3 sets - 10 reps - Seated Long Arc Quad  - 1 x daily - 7 x weekly - 1 sets - 20 reps - Seated Knee Extension Stretch with Chair  - 1 x daily - 7 x weekly - 1 sets - 1 reps - Supine Quadricep Sets  - 1 x daily - 7 x weekly - 3 sets - 10 reps  ASSESSMENT:  CLINICAL IMPRESSION: Patient is progressing appropriately but with continued 4/10 pain report.  Functionally he is doing well.  He understands,  per MD, that he is TKA candidate  but that he should continue strengthening in preparation for surgery.  Patient quite talkative today which impedes ability to add more exercises each session.   OBJECTIVE IMPAIRMENTS: decreased balance, difficulty walking, decreased ROM, decreased strength, increased edema, increased fascial restrictions, increased muscle spasms, impaired flexibility, postural dysfunction, obesity, and pain.   ACTIVITY LIMITATIONS: carrying, lifting, bending, standing, squatting, sleeping, stairs, transfers, bed mobility, bathing, toileting, and dressing  PARTICIPATION LIMITATIONS: meal prep, cleaning, laundry, driving, shopping, community activity, yard work, and church  PERSONAL FACTORS: Fitness, Past/current experiences, Profession, and 1-2 comorbidities: obesity and Type 2 diabetes are also affecting patient's functional outcome.   REHAB POTENTIAL: Fair due to end stage  CLINICAL DECISION MAKING: Evolving/moderate complexity  EVALUATION COMPLEXITY: Moderate   GOALS: Goals reviewed with patient? Yes  SHORT TERM GOALS: Target date: 12/20/2023  Pain report to be no greater than 4/10  Baseline: Goal status: Ongoing  2.  Patient will be independent with initial HEP  Baseline:  Goal status: Met on 12/08/23   LONG TERM GOALS: Target date: 01/17/2024  Patient to report pain no greater than 2/10  Baseline:  Goal status: INITIAL  2.  Patient to be independent with advanced HEP including comprehensive gym routine Baseline:  Goal status: INITIAL  3.  Patient to be able to bend, stoop and squat with pain no greater than 2/10  Baseline:  Goal status: INITIAL  4.  Patient to be able to ascend and descend steps without pain or no greater than 2/10  Baseline:  Goal status: INITIAL  5.  LEFS score to improve by  4-5 points Baseline: 35/80 = 43.75% Goal status: INITIAL  6.  Functional tests to improve by 2-3 seconds Baseline:  Goal status: Ongoing (see above)   PLAN:  PT FREQUENCY:  1-2x/week  PT DURATION: 8 weeks  PLANNED INTERVENTIONS: 97110-Therapeutic exercises, 97530- Therapeutic activity, [id number]- Neuromuscular re-education, 97535- Self Care, 02859- Manual therapy, [id number]- Gait training, [phone]- Canalith repositioning, [id number]- Aquatic Therapy, [phone]- Electrical stimulation (unattended), [phone]- Electrical stimulation (manual), [id number]- Vasopneumatic device, [id number]- Ultrasound, [id number]- Ionotophoresis 4mg /ml Dexamethasone , 79439 (1-2 muscles), 20561 (3+ muscles)- Dry Needling, Patient/Family education, Balance training, Stair training, Taping, Joint mobilization, Vestibular training, DME instructions, Cryotherapy, and Moist heat  PLAN FOR NEXT SESSION: Nustep, progress quad rehab and hip strengthening   Dhriti Fales B. Tilla Wilborn, PT 12/22/23 5:42 PM [facility] [address], [address] [place], [place] [address] Phone # [phone] Fax [phone]

## 2023-12-26 ENCOUNTER — Other Ambulatory Visit

## 2023-12-27 ENCOUNTER — Ambulatory Visit

## 2023-12-27 ENCOUNTER — Inpatient Hospital Stay: Attending: Hematology

## 2023-12-27 DIAGNOSIS — M6281 Muscle weakness (generalized): Secondary | ICD-10-CM

## 2023-12-27 DIAGNOSIS — R262 Difficulty in walking, not elsewhere classified: Secondary | ICD-10-CM

## 2023-12-27 DIAGNOSIS — M25562 Pain in left knee: Secondary | ICD-10-CM

## 2023-12-27 DIAGNOSIS — R252 Cramp and spasm: Secondary | ICD-10-CM

## 2023-12-27 DIAGNOSIS — M25662 Stiffness of left knee, not elsewhere classified: Secondary | ICD-10-CM

## 2023-12-27 DIAGNOSIS — D519 Vitamin B12 deficiency anemia, unspecified: Secondary | ICD-10-CM | POA: Insufficient documentation

## 2023-12-27 DIAGNOSIS — Z85038 Personal history of other malignant neoplasm of large intestine: Secondary | ICD-10-CM | POA: Insufficient documentation

## 2023-12-27 DIAGNOSIS — R293 Abnormal posture: Secondary | ICD-10-CM

## 2023-12-27 NOTE — Therapy (Signed)
 OUTPATIENT PHYSICAL THERAPY TREATMENT NOTE Progress Note Reporting Period 11/22/23 to 12/27/23  See note below for Objective Data and Assessment of Progress/Goals.       Patient Name: Eric Macdonald MRN: [id number] DOB:02-23-1956, 68 y.o., male Today's Date: 12/27/2023  END OF SESSION:  PT End of Session - 12/27/23 1200     Visit Number 10    Date for Recertification  01/17/24    Authorization Type [employer] MC dual complete- no auth required    Authorization Time Period No auth required    Progress Note Due on Visit 10    PT Start Time 1145    PT Stop Time 1230    PT Time Calculation (min) 45 min    Activity Tolerance Patient tolerated treatment well    Behavior During Therapy WFL for tasks assessed/performed            Past Medical History:  Diagnosis Date   Arthritis    Cancer (HCC)    colon   Chronic low back pain    Is supposed to have surgery soon at L4-5   Diabetes mellitus without complication (HCC)    Dx'ed 09/01/22 with HgA1C 6.9   Hypertension    Knee pain    Past Surgical History:  Procedure Laterality Date   JOINT REPLACEMENT     knee 05/2011   KNEE SURGERY  08   rt arthroscopy   LAPAROSCOPIC PARTIAL COLECTOMY N/A 06/20/2020   Procedure: LAPAROSCOPIC PARTIAL COLECTOMY;  Surgeon: Debby Hila, MD;  Location: [facility];  Service: General;  Laterality: N/A;   TOTAL KNEE ARTHROPLASTY  05/05/2011   Procedure: TOTAL KNEE ARTHROPLASTY;  Surgeon: Toribio JULIANNA Chancy, MD;  Location: [facility] OR;  Service: Orthopedics;  Laterality: Right;  DR MURPHY WANTS 90 MINUTES FOR THIS CASE   Patient Active Problem List   Diagnosis Date Noted   Osteoarthritis of left knee 11/16/2023   Hepatic steatosis 06/23/2023   Morbid obesity with body mass index (BMI) of 40.0 to 44.9 in adult [facility]) 04/13/2023   Chronic pain of left knee 04/13/2023   Type 2 diabetes mellitus with other specified complication, without long-term current use of insulin (HCC) 09/01/2022   Aortic atherosclerosis  09/01/2022   B12 deficiency anemia 07/21/2020   Hypokalemia 06/21/2020   Transfusion of blood product refused for religious reason 06/21/2020   Essential (primary) hypertension    Chronic low back pain    Cancer of sigmoid colon (HCC) 06/20/2020   Insomnia 07/23/2013    PCP: [name], [name], MD  REFERRING PROVIDER: [name], [name], MD  REFERRING DIAG: [phone] (ICD-10-CM) - Osteoarthritis of left knee, unspecified osteoarthritis type  THERAPY DIAG:  Stiffness of left knee, not elsewhere classified  Acute pain of left knee  Muscle weakness (generalized)  Difficulty in walking, not elsewhere classified  Cramp and spasm  Abnormal posture  Rationale for Evaluation and Treatment: Rehabilitation  ONSET DATE: 11/16/2023  SUBJECTIVE:   SUBJECTIVE STATEMENT: Patient reports pain at 4/10.       PERTINENT HISTORY: Right TKA 9 years ago.   PAIN:  12/27/23 Are you having pain? Yes: NPRS scale: 4/10 Pain location: left knee Pain description: aching Aggravating factors: walking, inclines, steps, bending, stooping and squatting Relieving factors: rest,  Tylenol   PRECAUTIONS: None  RED FLAGS: None   WEIGHT BEARING RESTRICTIONS: No  FALLS:  Has patient fallen in last 6 months? No  LIVING ENVIRONMENT: Lives with: lives alone Lives in: House/apartment Stairs: No Has following equipment at home: None  OCCUPATION: Retired Surveyor, [employer]  PLOF: Independent, Independent with basic ADLs, Independent with household mobility without device, Independent with community mobility without device, Independent with homemaking with ambulation, Independent with gait, and Independent with transfers  PATIENT GOALS: Trying to buy some time before needing knee replacement.    NEXT MD VISIT: prn  OBJECTIVE:  Note: Objective measures were completed at Evaluation unless otherwise noted.  DIAGNOSTIC FINDINGS: none available  PATIENT SURVEYS:  LEFS  Extreme difficulty/unable (0), Quite a  bit of difficulty (1), Moderate difficulty (2), Little difficulty (3), No difficulty (4) Survey date:  11/29/23 10/14  Any of your usual work, housework or school activities 3 4  2. Usual hobbies, recreational or sporting activities 2 4  3. Getting into/out of the bath 4 4  4. Walking between rooms 4 4  5. Putting on socks/shoes 2 2  6. Squatting  2 2  7. Lifting an object, like a bag of groceries from the floor 2 3  8. Performing light activities around your home 4 4  9. Performing heavy activities around your home 2 4  10. Getting into/out of a car 1 3  11. Walking 2 blocks 0 2  12. Walking 1 mile 0 0  13. Going up/down 10 stairs (1 flight) 3 3  14. Standing for 1 hour 2 2  15.  sitting for 1 hour 2 3  16. Running on even ground 0 0  17. Running on uneven ground 0 0  18. Making sharp turns while running fast 0   19. Hopping  0 0  20. Rolling over in bed 2 4  Score total:  35/80 = 43.75%  46/80= 57.5%     COGNITION: Overall cognitive status: Within functional limits for tasks assessed     SENSATION: WFL   POSTURE: slight knee valgus on left  PALPATION: Moderate crepitus on left on seated knee flexion/extension  LOWER EXTREMITY ROM:  WFL  LOWER EXTREMITY MMT:  Generally 4+/5 with exception of left knee extension and flexion at end ranges 4/5  LOWER EXTREMITY SPECIAL TESTS:  Knee special tests: Patellafemoral grind test: positive , Step up/down test: positive , and Patella tap test (ballotable patella): positive   FUNCTIONAL TESTS:  Eval: 5 times sit to stand: 16.30 sec Timed up and go (TUG): 10.87sec  11/29/2023: 6 minute walk test:  906 ft with RPE of 5/10  12/08/2023: 5 times sit to stand:  15.21 sec Timed up and go (TUG):  11.85 sec  12/27/2023: 5 times sit to stand:  15.79 sec Timed up and go (TUG):  10.23 sec  GAIT: Distance walked: 30 feet Assistive device utilized: None Level of assistance: Modified independence Comments: antalgic start up and  weight shifted to right                                                                                                                                TREATMENT DATE:  12/27/23  Nustep L5 x 6' with PT present to discuss status 10th visit re-assessment Sit to stand with 15lb kbell x 10  Squats with 15lb kbell - x 10 Single leg dead left with 10 lb kb 2 x 10 Unable to do more exercise due to rest periods and time taken to fill out LEFS    12/22/23 Nustep L5 x 6' with PT present to discuss status Sit to stand with 15lb kbell x 10  Squats with 15lb kbell - x 10 Standing hip matrix: hip abduction and extension 2 x 10 45# Seated alt LE march x20 6lb chair + pad Seated LAQ x 20 with 6# Patient was quite talkative today- unable to do further treatment  12/20/23 Nustep L5 x 6' with PT present to discuss status Sit to stand with 15lb kbell x 10  Squats with 15lb kbell - x 10 Standing hip matrix: hip abduction and extension 2 x 10 45# Seated alt LE march x20 6lb chair + pad Seated LAQ x 20 with 6# Hip matrix : hip ext and abduction 2 x 10 each with 45# Ice to left knee x 10 min at end of session  12/15/23 Nustep L5 x 6' with PT present to discuss status Seated alt LE march x20 6lb chair + pad Seated LAQ x 20 with 6# Hip matrix : hip ext and abduction 2 x 10 each with 45# Sit to stand from chair 10lb kbell, then squat to chair 10lb kbell - 10x each Seated hamstring stretch 2x20 sec bilat Seated hamstring curl with green tband x 20 Pt requested to try squats with 15lb kbell - 7 reps, then 4 reps Ice to left knee x 10 min at end of session   PATIENT EDUCATION:  Education details: Initiated HEP and discussed appropriate time to do the knee replacement Person educated: Patient Education method: Explanation, Demonstration, Tactile cues, Verbal cues, and Handouts Education comprehension: verbalized understanding, returned demonstration, and verbal cues required  HOME EXERCISE  PROGRAM: Access Code: [email or web address] URL: [email or web address] Date: 11/22/2023 Prepared by: Delon Haddock  Exercises - Supine Heel Slide  - 1 x daily - 7 x weekly - 3 sets - 10 reps - Seated Long Arc Quad  - 1 x daily - 7 x weekly - 1 sets - 20 reps - Seated Knee Extension Stretch with Chair  - 1 x daily - 7 x weekly - 1 sets - 1 reps - Supine Quadricep Sets  - 1 x daily - 7 x weekly - 3 sets - 10 reps  ASSESSMENT:  CLINICAL IMPRESSION: Patient is progressing appropriately but with continued 4/10 pain report.  Objective findings are improved and patient appears to have less antalgic gait.  He would benefit from continuing the current POC to meet goals below.    OBJECTIVE IMPAIRMENTS: decreased balance, difficulty walking, decreased ROM, decreased strength, increased edema, increased fascial restrictions, increased muscle spasms, impaired flexibility, postural dysfunction, obesity, and pain.   ACTIVITY LIMITATIONS: carrying, lifting, bending, standing, squatting, sleeping, stairs, transfers, bed mobility, bathing, toileting, and dressing  PARTICIPATION LIMITATIONS: meal prep, cleaning, laundry, driving, shopping, community activity, yard work, and church  PERSONAL FACTORS: Fitness, Past/current experiences, Profession, and 1-2 comorbidities: obesity and Type 2 diabetes are also affecting patient's functional outcome.   REHAB POTENTIAL: Fair due to end stage  CLINICAL DECISION MAKING: Evolving/moderate complexity  EVALUATION COMPLEXITY: Moderate   GOALS: Goals reviewed with patient? Yes  SHORT TERM GOALS: Target date: 12/20/2023  Pain report to be no greater than 4/10  Baseline: Goal status: Ongoing  2.  Patient will be independent with initial HEP  Baseline:  Goal status: Met on 12/08/23   LONG TERM GOALS: Target date: 01/17/2024  Patient to report pain no greater than 2/10  Baseline:  Goal status: In progress  2.  Patient to be independent with advanced  HEP including comprehensive gym routine Baseline:  Goal status: INITIAL  3.  Patient to be able to bend, stoop and squat with pain no greater than 2/10  Baseline:  Goal status: INITIAL  4.  Patient to be able to ascend and descend steps without pain or no greater than 2/10  Baseline:  Goal status: INITIAL  5.  LEFS score to improve by 4-5 points Baseline: 35/80 = 43.75% Goal status: INITIAL  6.  Functional tests to improve by 2-3 seconds Baseline:  Goal status: Ongoing (see above)   PLAN:  PT FREQUENCY: 1-2x/week  PT DURATION: 8 weeks  PLANNED INTERVENTIONS: 97110-Therapeutic exercises, 97530- Therapeutic activity, [id number]- Neuromuscular re-education, 97535- Self Care, 02859- Manual therapy, [id number]- Gait training, [phone]- Canalith repositioning, [id number]- Aquatic Therapy, [phone]- Electrical stimulation (unattended), [phone]- Electrical stimulation (manual), [id number]- Vasopneumatic device, [id number]- Ultrasound, [id number]- Ionotophoresis 4mg /ml Dexamethasone , 79439 (1-2 muscles), 20561 (3+ muscles)- Dry Needling, Patient/Family education, Balance training, Stair training, Taping, Joint mobilization, Vestibular training, DME instructions, Cryotherapy, and Moist heat  PLAN FOR NEXT SESSION: Nustep, progress quad rehab and hip strengthening   Anatasia Tino B. Tory Septer, PT 12/27/23 5:56 PM [facility] [address], [address] [place], [place] [address] Phone # [phone] Fax [phone]

## 2023-12-29 ENCOUNTER — Ambulatory Visit: Admitting: Rehabilitative and Restorative Service Providers"

## 2023-12-29 ENCOUNTER — Encounter: Payer: Self-pay | Admitting: Rehabilitative and Restorative Service Providers"

## 2023-12-29 DIAGNOSIS — R252 Cramp and spasm: Secondary | ICD-10-CM

## 2023-12-29 DIAGNOSIS — M25562 Pain in left knee: Secondary | ICD-10-CM | POA: Diagnosis not present

## 2023-12-29 DIAGNOSIS — R262 Difficulty in walking, not elsewhere classified: Secondary | ICD-10-CM

## 2023-12-29 DIAGNOSIS — M25662 Stiffness of left knee, not elsewhere classified: Secondary | ICD-10-CM

## 2023-12-29 DIAGNOSIS — M6281 Muscle weakness (generalized): Secondary | ICD-10-CM

## 2023-12-29 NOTE — Therapy (Signed)
 OUTPATIENT PHYSICAL THERAPY TREATMENT NOTE     Patient Name: Eric Macdonald MRN: [id number] DOB:1955-12-15, 68 y.o., male Today's Date: 12/29/2023  END OF SESSION:  PT End of Session - 12/29/23 1230     Visit Number 11    Date for Recertification  01/17/24    Authorization Type [employer] MC dual complete- no auth required    Progress Note Due on Visit 20    PT Start Time 1230    PT Stop Time 1320    PT Time Calculation (min) 50 min    Activity Tolerance Patient tolerated treatment well    Behavior During Therapy WFL for tasks assessed/performed            Past Medical History:  Diagnosis Date   Arthritis    Cancer (HCC)    colon   Chronic low back pain    Is supposed to have surgery soon at L4-5   Diabetes mellitus without complication (HCC)    Dx'ed 09/01/22 with HgA1C 6.9   Hypertension    Knee pain    Past Surgical History:  Procedure Laterality Date   JOINT REPLACEMENT     knee 05/2011   KNEE SURGERY  08   rt arthroscopy   LAPAROSCOPIC PARTIAL COLECTOMY N/A 06/20/2020   Procedure: LAPAROSCOPIC PARTIAL COLECTOMY;  Surgeon: Debby Hila, MD;  Location: [facility];  Service: General;  Laterality: N/A;   TOTAL KNEE ARTHROPLASTY  05/05/2011   Procedure: TOTAL KNEE ARTHROPLASTY;  Surgeon: Toribio JULIANNA Chancy, MD;  [facility]: [facility] OR;  Service: Orthopedics;  Laterality: Right;  DR MURPHY WANTS 90 MINUTES FOR THIS CASE   Patient Active Problem List   Diagnosis Date Noted   Osteoarthritis of left knee 11/16/2023   Hepatic steatosis 06/23/2023   Morbid obesity with body mass index (BMI) of 40.0 to 44.9 in adult [facility]) 04/13/2023   Chronic pain of left knee 04/13/2023   Type 2 diabetes mellitus with other specified complication, without long-term current use of insulin (HCC) 09/01/2022   Aortic atherosclerosis 09/01/2022   B12 deficiency anemia 07/21/2020   Hypokalemia 06/21/2020   Transfusion of blood product refused for religious reason 06/21/2020   Essential (primary)  hypertension    Chronic low back pain    Cancer of sigmoid colon (HCC) 06/20/2020   Insomnia 07/23/2013    PCP: [name], [name], MD  REFERRING PROVIDER: [name], [name], MD  REFERRING DIAG: [phone] (ICD-10-CM) - Osteoarthritis of left knee, unspecified osteoarthritis type  THERAPY DIAG:  Stiffness of left knee, not elsewhere classified  Acute pain of left knee  Muscle weakness (generalized)  Difficulty in walking, not elsewhere classified  Cramp and spasm  Rationale for Evaluation and Treatment: Rehabilitation  ONSET DATE: 11/16/2023  SUBJECTIVE:   SUBJECTIVE STATEMENT: Patient reports that he always has around a level 4/10 pain.      PERTINENT HISTORY: Right TKA 9 years ago.   PAIN:  12/29/23 Are you having pain? Yes: NPRS scale: 4/10 Pain location: left knee Pain description: aching Aggravating factors: walking, inclines, steps, bending, stooping and squatting Relieving factors: rest,  Tylenol   PRECAUTIONS: None  RED FLAGS: None   WEIGHT BEARING RESTRICTIONS: No  FALLS:  Has patient fallen in last 6 months? No  LIVING ENVIRONMENT: Lives with: lives alone Lives in: House/apartment Stairs: No Has following equipment at home: None  OCCUPATION: Retired Surveyor, [employer]  PLOF: Independent, Independent with basic ADLs, Independent with household mobility without device, Independent with community mobility without device, Independent with homemaking with ambulation,  Independent with gait, and Independent with transfers  PATIENT GOALS: Trying to buy some time before needing knee replacement.    NEXT MD VISIT: prn  OBJECTIVE:  Note: Objective measures were completed at Evaluation unless otherwise noted.  DIAGNOSTIC FINDINGS: none available  PATIENT SURVEYS:  LEFS  Extreme difficulty/unable (0), Quite a bit of difficulty (1), Moderate difficulty (2), Little difficulty (3), No difficulty (4) Survey date:  11/29/23 10/14  Any of your usual work, housework or  school activities 3 4  2. Usual hobbies, recreational or sporting activities 2 4  3. Getting into/out of the bath 4 4  4. Walking between rooms 4 4  5. Putting on socks/shoes 2 2  6. Squatting  2 2  7. Lifting an object, like a bag of groceries from the floor 2 3  8. Performing light activities around your home 4 4  9. Performing heavy activities around your home 2 4  10. Getting into/out of a car 1 3  11. Walking 2 blocks 0 2  12. Walking 1 mile 0 0  13. Going up/down 10 stairs (1 flight) 3 3  14. Standing for 1 hour 2 2  15.  sitting for 1 hour 2 3  16. Running on even ground 0 0  17. Running on uneven ground 0 0  18. Making sharp turns while running fast 0   19. Hopping  0 0  20. Rolling over in bed 2 4  Score total:  35/80 = 43.75%  46/80= 57.5%     COGNITION: Overall cognitive status: Within functional limits for tasks assessed     SENSATION: WFL   POSTURE: slight knee valgus on left  PALPATION: Moderate crepitus on left on seated knee flexion/extension  LOWER EXTREMITY ROM:  WFL  LOWER EXTREMITY MMT:  Generally 4+/5 with exception of left knee extension and flexion at end ranges 4/5  LOWER EXTREMITY SPECIAL TESTS:  Knee special tests: Patellafemoral grind test: positive , Step up/down test: positive , and Patella tap test (ballotable patella): positive   FUNCTIONAL TESTS:  Eval: 5 times sit to stand: 16.30 sec Timed up and go (TUG): 10.87sec  11/29/2023: 6 minute walk test:  906 ft with RPE of 5/10  12/08/2023: 5 times sit to stand:  15.21 sec Timed up and go (TUG):  11.85 sec  12/27/2023: 5 times sit to stand:  15.79 sec Timed up and go (TUG):  10.23 sec  GAIT: Distance walked: 30 feet Assistive device utilized: None Level of assistance: Modified independence Comments: antalgic start up and weight shifted to right                                                                                                                                 TREATMENT DATE:   12/29/2023: Nustep L5 x 6' with PT present to discuss status Sit to stand with 15lb kbell x 10  Squats with 15lb kbell - x 10  Seated with 6# ankle weights:  LAQ, marching, and heel/toe raises.  2x10 each bilat Ambulation with 6# ankle weights to cancer gym and back x1 lap Seated hamstring curl with green tband 2x10 bilat Seated hip abduction clamshells with green tband 2x10 Seated modified dead lift with 15# kettlebell 2x10 Farmer's carry with 15# kettlebell down to cancer center and back, switching hands half way.   Standing hip abduction and hip extension x10 each bilat   12/27/23 Nustep L5 x 6' with PT present to discuss status 10th visit re-assessment Sit to stand with 15lb kbell x 10  Squats with 15lb kbell - x 10 Single leg dead left with 10 lb kb 2 x 10 Unable to do more exercise due to rest periods and time taken to fill out LEFS    12/22/23 Nustep L5 x 6' with PT present to discuss status Sit to stand with 15lb kbell x 10  Squats with 15lb kbell - x 10 Standing hip matrix: hip abduction and extension 2 x 10 45# Seated alt LE march x20 6lb chair + pad Seated LAQ x 20 with 6# Patient was quite talkative today- unable to do further treatment    PATIENT EDUCATION:  Education details: Initiated HEP and discussed appropriate time to do the knee replacement Person educated: Patient Education method: Explanation, Demonstration, Tactile cues, Verbal cues, and Handouts Education comprehension: verbalized understanding, returned demonstration, and verbal cues required  HOME EXERCISE PROGRAM: Access Code: [email or web address] URL: [email or web address] Date: 12/29/2023 Prepared by: Jarrell Eliyas Suddreth  Exercises - Supine Heel Slide  - 1 x daily - 7 x weekly - 3 sets - 10 reps - Seated Long Arc Quad  - 1 x daily - 7 x weekly - 1 sets - 20 reps - Seated Knee Extension Stretch with Chair  - 1 x daily - 7 x weekly - 1 sets - 1 reps - Supine Quadricep Sets  -  1 x daily - 7 x weekly - 3 sets - 10 reps - Standing Hip Abduction with Counter Support  - 1 x daily - 7 x weekly - 2 sets - 10 reps - Standing Hip Extension with Counter Support  - 1 x daily - 7 x weekly - 2 sets - 10 reps  ASSESSMENT:  CLINICAL IMPRESSION: Mr Ducey presents to skilled PT reporting that he is still having 4/10 pain.  Patient was able to continue to perform strengthening exercises.  Able to keep patient on task during the exercises to have a full session.  Patient continues to report the cold pack at the end of the session helps with his decreased pain.  Patient provided with updated HEP to progress his exercises at home and incorporate additional strengthening exercises.  Patient continues to require skilled PT to progress towards goal related activities.    OBJECTIVE IMPAIRMENTS: decreased balance, difficulty walking, decreased ROM, decreased strength, increased edema, increased fascial restrictions, increased muscle spasms, impaired flexibility, postural dysfunction, obesity, and pain.   ACTIVITY LIMITATIONS: carrying, lifting, bending, standing, squatting, sleeping, stairs, transfers, bed mobility, bathing, toileting, and dressing  PARTICIPATION LIMITATIONS: meal prep, cleaning, laundry, driving, shopping, community activity, yard work, and church  PERSONAL FACTORS: Fitness, Past/current experiences, Profession, and 1-2 comorbidities: obesity and Type 2 diabetes are also affecting patient's functional outcome.   REHAB POTENTIAL: Fair due to end stage  CLINICAL DECISION MAKING: Evolving/moderate complexity  EVALUATION COMPLEXITY: Moderate   GOALS: Goals reviewed with patient? Yes  SHORT TERM GOALS: Target date: 12/20/2023  Pain report to be  no greater than 4/10  Baseline: Goal status: Met on 12/29/2023 (pt reports consistently a 4/10)  2.  Patient will be independent with initial HEP  Baseline:  Goal status: Met on 12/08/23   LONG TERM GOALS: Target date:  01/17/2024  Patient to report pain no greater than 2/10  Baseline:  Goal status: In progress  2.  Patient to be independent with advanced HEP including comprehensive gym routine Baseline:  Goal status: Ongoing  3.  Patient to be able to bend, stoop and squat with pain no greater than 2/10  Baseline:  Goal status: INITIAL  4.  Patient to be able to ascend and descend steps without pain or no greater than 2/10  Baseline:  Goal status: INITIAL  5.  LEFS score to improve by 4-5 points Baseline: 35/80 = 43.75% Goal status: INITIAL  6.  Functional tests to improve by 2-3 seconds Baseline:  Goal status: Ongoing (see above)   PLAN:  PT FREQUENCY: 1-2x/week  PT DURATION: 8 weeks  PLANNED INTERVENTIONS: 97110-Therapeutic exercises, 97530- Therapeutic activity, 97112- Neuromuscular re-education, 97535- Self Care, 02859- Manual therapy, [phone]- Gait training, [phone]- Canalith repositioning, [id number]- Aquatic Therapy, [phone]- Electrical stimulation (unattended), [phone]- Electrical stimulation (manual), [id number]- Vasopneumatic device, [id number]- Ultrasound, [id number]- Ionotophoresis 4mg /ml Dexamethasone , 79439 (1-2 muscles), [id number] (3+ muscles)- Dry Needling, Patient/Family education, Balance training, Stair training, Taping, Joint mobilization, Vestibular training, DME instructions, Cryotherapy, and Moist heat  PLAN FOR NEXT SESSION: Nustep, progress quad rehab and hip strengthening   Jarrell Laming, PT, DPT 12/29/23, 1:18 PM  [facility] [address] [place], [place] [address] Phone # [phone] Fax [phone]

## 2024-01-02 ENCOUNTER — Ambulatory Visit: Admitting: Nurse Practitioner

## 2024-01-03 ENCOUNTER — Telehealth: Payer: Self-pay

## 2024-01-03 ENCOUNTER — Inpatient Hospital Stay

## 2024-01-03 ENCOUNTER — Ambulatory Visit: Admitting: Rehabilitative and Restorative Service Providers"

## 2024-01-03 ENCOUNTER — Inpatient Hospital Stay (HOSPITAL_BASED_OUTPATIENT_CLINIC_OR_DEPARTMENT_OTHER): Admitting: Hematology

## 2024-01-03 ENCOUNTER — Inpatient Hospital Stay: Admitting: Hematology

## 2024-01-03 ENCOUNTER — Other Ambulatory Visit: Payer: Self-pay

## 2024-01-03 VITALS — BP 136/78 | HR 94 | Temp 98.3°F | Resp 17 | Ht 71.5 in | Wt 334.9 lb

## 2024-01-03 DIAGNOSIS — D519 Vitamin B12 deficiency anemia, unspecified: Secondary | ICD-10-CM

## 2024-01-03 DIAGNOSIS — C187 Malignant neoplasm of sigmoid colon: Secondary | ICD-10-CM

## 2024-01-03 DIAGNOSIS — Z85038 Personal history of other malignant neoplasm of large intestine: Secondary | ICD-10-CM | POA: Diagnosis not present

## 2024-01-03 DIAGNOSIS — I700 Atherosclerosis of aorta: Secondary | ICD-10-CM

## 2024-01-03 LAB — CMP (CANCER CENTER ONLY)
ALT: 27 U/L (ref 0–44)
AST: 19 U/L (ref 15–41)
Albumin: 4.2 g/dL (ref 3.5–5.0)
Alkaline Phosphatase: 43 U/L (ref 38–126)
Anion gap: 6 (ref 5–15)
BUN: 14 mg/dL (ref 8–23)
CO2: 31 mmol/L (ref 22–32)
Calcium: 9.6 mg/dL (ref 8.9–10.3)
Chloride: 100 mmol/L (ref 98–111)
Creatinine: 1.48 mg/dL — ABNORMAL HIGH (ref 0.61–1.24)
GFR, Estimated: 51 mL/min — ABNORMAL LOW (ref >60)
Glucose, Bld: 166 mg/dL — ABNORMAL HIGH (ref 70–99)
Potassium: 3.7 mmol/L (ref 3.5–5.1)
Sodium: 137 mmol/L (ref 135–145)
Total Bilirubin: 0.4 mg/dL (ref 0.0–1.2)
Total Protein: 7.9 g/dL (ref 6.5–8.1)

## 2024-01-03 LAB — CBC WITH DIFFERENTIAL (CANCER CENTER ONLY)
Abs Immature Granulocytes: 0.02 K/uL (ref 0.00–0.07)
Basophils Absolute: 0.1 K/uL (ref 0.0–0.1)
Basophils Relative: 1 %
Eosinophils Absolute: 0.2 K/uL (ref 0.0–0.5)
Eosinophils Relative: 3 %
HCT: 45.6 % (ref 39.0–52.0)
Hemoglobin: 15 g/dL (ref 13.0–17.0)
Immature Granulocytes: 0 %
Lymphocytes Relative: 31 %
Lymphs Abs: 1.9 K/uL (ref 0.7–4.0)
MCH: 25.3 pg — ABNORMAL LOW (ref 26.0–34.0)
MCHC: 32.9 g/dL (ref 30.0–36.0)
MCV: 77 fL — ABNORMAL LOW (ref 80.0–100.0)
Monocytes Absolute: 0.6 K/uL (ref 0.1–1.0)
Monocytes Relative: 10 %
Neutro Abs: 3.5 K/uL (ref 1.7–7.7)
Neutrophils Relative %: 55 %
Platelet Count: 284 K/uL (ref 150–400)
RBC: 5.92 MIL/uL — ABNORMAL HIGH (ref 4.22–5.81)
RDW: 17.8 % — ABNORMAL HIGH (ref 11.5–15.5)
WBC Count: 6.2 K/uL (ref 4.0–10.5)
nRBC: 0 % (ref 0.0–0.2)

## 2024-01-03 LAB — VITAMIN B12: Vitamin B-12: 688 pg/mL (ref 180–914)

## 2024-01-03 MED ORDER — CYANOCOBALAMIN 1000 MCG/ML IJ SOLN
1000.0000 ug | Freq: Once | INTRAMUSCULAR | Status: AC
  Administered 2024-01-03: 1000 ug via INTRAMUSCULAR
  Filled 2024-01-03: qty 1

## 2024-01-03 NOTE — Assessment & Plan Note (Deleted)
 moderately differentiated G2, pT3pN0M0 stage IIA, MMR normal, MSI-stable -He presented with 2-6 months bloody diarrhea, fatigue, and 20 lbs weight loss. 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 Baseline CEA mildly elevated 6.1, staging work up negative for metastatic disease.  -S/p partial colectomy 06/20/20 by [name], surgical path showed 12.7 cm carcinoma was removed, 0/27 LNs+, margins clear. There were no high risk features such as perforation, PNI, or LVI. -Given stage II colon cancer, adjuvant chemotherapy is not recommended. -The recurrence risk is moderate.   -ctDNA not detected (07/21/2020, 08/25/2020, 10/27/2020, 01/26/2021 and 05/25/2021, 02/2022, 08/2022) -Surveillance colonoscopy 11/2021 ([name] at [facility]) was negative, 3-year recall -Surveillance CT 48/2025 NED

## 2024-01-03 NOTE — Assessment & Plan Note (Signed)
-  Hgb 9.5 on 3/31 pre-op; MCV 69 with thrombocytosis. Likely secondary to tumor bleeding. Work up showed serum iron 34, TIBC 144, ferritin 125; he was found to have low folate 3.5 and low B12 103 -He is [employer]hovah's witness, does not accept blood products.  -B12 and folate WNL since 10/2020 -He began B12 injections in 2022, B12 level normalized.  He prefers to continue monthly B12 injection rather than take oral pill

## 2024-01-03 NOTE — Assessment & Plan Note (Deleted)
-  Hgb 9.5 on 3/31 pre-op; MCV 69 with thrombocytosis. Likely secondary to tumor bleeding. Work up showed serum iron 34, TIBC 144, ferritin 125; he was found to have low folate 3.5 and low B12 103 -He is Jehovah's witness, does not accept blood products.  -B12 and folate WNL since 10/2020 -He began B12 injections in 2022, B12 level normalized.  He prefers to continue monthly B12 injection rather than take oral pill

## 2024-01-03 NOTE — Progress Notes (Signed)
 [facility]   Telephone:(336) [phone] Fax:(336) [phone]   Clinic Follow up Note   Patient Care Team: [name], [name], MD as PCP - General (Family Medicine) [name], [name], NP as Nurse Practitioner (Oncology) Lanny Callander, MD as Consulting Physician (Hematology and Oncology) Debby Hila, MD as Consulting Physician (General Surgery) Tamela Maudlin, MD as Referring Physician (Gastroenterology)  Date of Service:  01/03/2024  CHIEF COMPLAINT: f/u of colon cancer and B12 deficiency  CURRENT THERAPY:  B12 injection monthly  Oncology History   B12 deficiency anemia -Hgb 9.5 on 3/31 pre-op; MCV 69 with thrombocytosis. Likely secondary to tumor bleeding. Work up showed serum iron 34, TIBC 144, ferritin 125; he was found to have low folate 3.5 and low B12 103 -He is Jehovah's witness, does not accept blood products.  -B12 and folate WNL since 10/2020 -He began B12 injections in 2022, B12 level normalized.  He prefers to continue monthly B12 injection rather than take oral pill   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 Baseline CEA mildly elevated 6.1, staging work up negative for metastatic disease.  -S/p partial colectomy 06/20/20 by Dr. Debby, surgical path showed 12.7 cm carcinoma was removed, 0/27 LNs+, margins clear. There were no high risk features such as perforation, PNI, or LVI. -Given stage II colon cancer, adjuvant chemotherapy is not recommended. -The recurrence risk is moderate.   -ctDNA not detected (07/21/2020, 08/25/2020, 10/27/2020, 01/26/2021 and 05/25/2021, 02/2022, 08/2022) -Surveillance colonoscopy 11/2021 (Dr. Tamela at [facility]) was negative, 3-year recall -Surveillance CT 48/2025 NED  Assessment & Plan Colon cancer, status post-surgery Three and a half  years post-surgery with low likelihood of recurrence. Continue colon cancer surveillance with routine lab and office visit  Vitamin B12 deficiency Well-managed with monthly B12 injections. Blood counts are normal with no anemia, and platelet and white blood cell counts are within normal limits. Energy levels are stable without fatigue related to B12 deficiency. - Continue monthly B12 injections at the clinic.   Plan - Lab reviewed, will proceed to B12 injection today and continue monthly - Lab and follow-up in 6 months  SUMMARY OF ONCOLOGIC HISTORY: Oncology History Overview Note   Cancer Staging  Cancer of sigmoid colon [facility]) Staging form: Colon and Rectum, AJCC 8th Edition - Pathologic stage from 06/20/2020: Stage IIA (pT3, pN0, cM0) - Signed by Ann Mayme POUR, NP on 07/21/2020    Cancer of sigmoid colon (HCC)  05/15/2020 Procedure   Colonoscopy by Dr. Maudlin Shahid: obstructing mass found in the sigmoid colon located 35 cm from the anus.  Unable to advance past the mass.  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  No metastatic disease in the chest, abdomen, pelvis   06/12/2020 Tumor Marker   Baseline CEA 6.1   06/20/2020 Initial Diagnosis   Cancer of sigmoid colon (HCC)   06/20/2020 Cancer Staging   Staging form: Colon and Rectum, AJCC 8th Edition - Pathologic stage from 06/20/2020: Stage IIA (pT3, pN0,  cM0) - Signed by Ann Mayme POUR, NP on 07/21/2020 Stage prefix: Initial diagnosis Total positive nodes: 0 Histologic grading system: 4 grade system Histologic grade (G): G2    06/20/2020 Definitive Surgery   Laparoscopic partial colectomy, small bowel resection by Dr. Debby   06/20/2020 Pathology Results   FINAL MICROSCOPIC DIAGNOSIS:   A. COLON, SIGMOID AND ATTACHED SMALL BOWEL, RESECTION:  -  Adenocarcinoma, moderately differentiated, 12.7 cm  -  No carcinoma identified in twenty-four lymph nodes (0/24)  -  Margins uninvolved by carcinoma (0.2 cm; mesenteric margin)  -  Sessile serrated polyp  -  See oncology table and comment below   B. COLON, ADDITIONAL PROXIMAL MARGIN, RESECTION:  -  No residual adenocarcinoma identified  -  No carcinoma identified in three lymph nodes (0/3)   MMR normal, MSI-stable   05/22/2021 Imaging   EXAM: CT CHEST, ABDOMEN, AND PELVIS WITH CONTRAST  IMPRESSION: 1. Prior partial sigmoidectomy with anastomotic sutures in the superior pelvis, without evidence of local recurrence/residual disease. 2. No convincing evidence of metastatic disease in the chest, abdomen or pelvis. 3. Prominent left common iliac lymph node measures 8 mm, is nonspecific. Attention on follow-up imaging suggested. 4. Generalized decrease in hepatic parenchymal attenuation with focal geographic areas of relative hyperattenuation, almost certainly reflects diffuse hepatic steatosis with focal fatty sparing. This could be more definitively characterized with hepatic protocol abdominal MRI with and without contrast if clinically indicated. 5.  Aortic Atherosclerosis (ICD10-I70.0).      Discussed the use of AI scribe software for clinical note transcription with the patient, who gave verbal consent to proceed.  History of Present Illness Eric Macdonald is a 68 year old male with B12 deficiency who presents for follow-up.  He receives monthly B12 injections, with normal blood counts and no anemia. He prefers clinic administration of these injections.  He experiences fatigue, which he attributes to knee issues. He is preparing for a second knee  replacement and attends physical therapy twice a week.  His medical history includes colon cancer surgery in 07/31/20. His oldest sister died from pancreatic cancer, but he does not share the same parents.     All other systems were reviewed with the patient and are negative.  MEDICAL HISTORY:  Past Medical History:  Diagnosis Date   Arthritis    Cancer (HCC)    colon   Chronic low back pain    Is supposed to have surgery soon at L4-5   Diabetes mellitus without complication (HCC)    Dx'ed 09/01/22 with HgA1C 6.9   Hypertension    Knee pain     SURGICAL HISTORY: Past Surgical History:  Procedure Laterality Date   JOINT REPLACEMENT     knee 05/2011   KNEE SURGERY  08   rt arthroscopy   LAPAROSCOPIC PARTIAL COLECTOMY N/A 06/20/2020   Procedure: LAPAROSCOPIC PARTIAL COLECTOMY;  Surgeon: Debby Hila, MD;  Location: [facility];  Service: General;  Laterality: N/A;   TOTAL KNEE ARTHROPLASTY  05/05/2011   Procedure: TOTAL KNEE ARTHROPLASTY;  Surgeon: Toribio JULIANNA Chancy, MD;  Location: [facility] OR;  Service: Orthopedics;  Laterality: Right;  DR MURPHY WANTS 90 MINUTES FOR THIS CASE    I have reviewed the social history and family history with the patient and they are unchanged from previous note.  ALLERGIES:  is allergic to other.  MEDICATIONS:  Current Outpatient Medications  Medication Sig Dispense Refill   amLODipine -olmesartan  (AZOR ) 10-40 MG tablet Take 1 tablet by mouth daily. 90 tablet  2   Blood Glucose Monitoring Suppl (ACCU-CHEK AVIVA PLUS) w/Device KIT As directed. 1 kit 0   cyanocobalamin  (VITAMIN B12) 1000 MCG tablet Take 1 tablet (1,000 mcg total) by mouth daily. 90 tablet 1   dapagliflozin  propanediol (FARXIGA ) 10 MG TABS tablet Take 1 tablet (10 mg total) by mouth daily before breakfast. 30 tablet 3   Glucose Blood (BLOOD GLUCOSE TEST STRIPS 333) STRP Check blood sugar daily. 100 strip 2   hydrochlorothiazide  (HYDRODIURIL ) 12.5 MG tablet Take 1 tablet (12.5 mg total) by mouth  daily. 90 tablet 1   rosuvastatin  (CRESTOR ) 10 MG tablet Take 1 tablet (10 mg total) by mouth daily. 90 tablet 2   Semaglutide  (RYBELSUS ) 3 MG TABS Take 1 tablet (3 mg total) by mouth daily. 90 tablet 1   No current facility-administered medications for this visit.    PHYSICAL EXAMINATION: ECOG PERFORMANCE STATUS: 1 - Symptomatic but completely ambulatory  Vitals:   01/03/24 1203  BP: 136/78  Pulse: 94  Resp: 17  Temp: 98.3 F (36.8 C)  SpO2: 99%   Wt Readings from Last 3 Encounters:  01/03/24 (!) 334 lb 14.4 oz (151.9 kg)  11/16/23 (!) 328 lb 8 oz (149 kg)  11/01/23 (!) 326 lb 12.8 oz (148.2 kg)     GENERAL:alert, no distress and comfortable SKIN: skin color, texture, turgor are normal, no rashes or significant lesions EYES: normal, Conjunctiva are pink and non-injected, sclera clear NECK: supple, thyroid normal size, non-tender, without nodularity LYMPH:  no palpable lymphadenopathy in the cervical, axillary  LUNGS: clear to auscultation and percussion with normal breathing effort HEART: regular rate & rhythm and no murmurs and no lower extremity edema ABDOMEN:abdomen soft, non-tender and normal bowel sounds Musculoskeletal:no cyanosis of digits and no clubbing  NEURO: alert & oriented x 3 with fluent speech, no focal motor/sensory deficits  Physical Exam MEASUREMENTS: Height- 5 feet 6 inches.  LABORATORY DATA:  I have reviewed the data as listed    Latest Ref Rng & Units 01/03/2024   11:53 AM 07/04/2023   12:57 PM 03/30/2023    1:02 PM  CBC  WBC 4.0 - 10.5 K/uL 6.2  5.8  6.2   Hemoglobin 13.0 - 17.0 g/dL 84.9  85.1  84.5   Hematocrit 39.0 - 52.0 % 45.6  43.8  46.1   Platelets 150 - 400 K/uL 284  260  324         Latest Ref Rng & Units 01/03/2024   11:53 AM 07/04/2023   12:57 PM 05/19/2023    9:20 AM  CMP  Glucose 70 - 99 mg/dL 833  863  90   BUN 8 - 23 mg/dL 14  17  14    Creatinine 0.61 - 1.24 mg/dL 8.51  8.51  8.78   Sodium 135 - 145 mmol/L 137  139  137    Potassium 3.5 - 5.1 mmol/L 3.7  3.6  3.6   Chloride 98 - 111 mmol/L 100  102  100   CO2 22 - 32 mmol/L 31  31  31    Calcium  8.9 - 10.3 mg/dL 9.6  9.4  9.7   Total Protein 6.5 - 8.1 g/dL 7.9  7.6    Total Bilirubin 0.0 - 1.2 mg/dL 0.4  0.4    Alkaline Phos 38 - 126 U/L 43  42    AST 15 - 41 U/L 19  16    ALT 0 - 44 U/L 27  16  RADIOGRAPHIC STUDIES: I have personally reviewed the radiological images as listed and agreed with the findings in the report. No results found.    No orders of the defined types were placed in this encounter.  All questions were answered. The patient knows to call the clinic with any problems, questions or concerns. No barriers to learning was detected. The total time spent in the appointment was 20 minutes, including review of chart and various tests results, discussions about plan of care and coordination of care plan     Onita Mattock, MD 01/03/2024

## 2024-01-03 NOTE — Telephone Encounter (Signed)
 Patient did not arrive to his appointments today. Attempted to contact the patient @T # [phone]. Unable to reach the patient. LVM to contact the facility.

## 2024-01-03 NOTE — Assessment & Plan Note (Signed)
 moderately differentiated G2, pT3pN0M0 stage IIA, MMR normal, MSI-stable -He presented with 2-6 months bloody diarrhea, fatigue, and 20 lbs weight loss. 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 Baseline CEA mildly elevated 6.1, staging work up negative for metastatic disease.  -S/p partial colectomy 06/20/20 by [name], surgical path showed 12.7 cm carcinoma was removed, 0/27 LNs+, margins clear. There were no high risk features such as perforation, PNI, or LVI. -Given stage II colon cancer, adjuvant chemotherapy is not recommended. -The recurrence risk is moderate.   -ctDNA not detected (07/21/2020, 08/25/2020, 10/27/2020, 01/26/2021 and 05/25/2021, 02/2022, 08/2022) -Surveillance colonoscopy 11/2021 ([name] at [facility]) was negative, 3-year recall -Surveillance CT 48/2025 NED

## 2024-01-05 ENCOUNTER — Ambulatory Visit

## 2024-01-05 DIAGNOSIS — M25662 Stiffness of left knee, not elsewhere classified: Secondary | ICD-10-CM

## 2024-01-05 DIAGNOSIS — M6281 Muscle weakness (generalized): Secondary | ICD-10-CM

## 2024-01-05 DIAGNOSIS — M25562 Pain in left knee: Secondary | ICD-10-CM

## 2024-01-05 DIAGNOSIS — R252 Cramp and spasm: Secondary | ICD-10-CM

## 2024-01-05 NOTE — Therapy (Signed)
 OUTPATIENT PHYSICAL THERAPY TREATMENT NOTE     Patient Name: Eric Macdonald MRN: [id number] DOB:[birth year], 68 y.o., male Today's Date: 01/05/2024  END OF SESSION:  PT End of Session - 01/05/24 1200     Visit Number 12    Date for Recertification  01/17/24    Authorization Type [employer] MC dual complete- no auth required    Authorization Time Period No auth required    Progress Note Due on Visit 20    PT Start Time 1150    PT Stop Time 1237    PT Time Calculation (min) 47 min    Activity Tolerance Patient tolerated treatment well    Behavior During Therapy WFL for tasks assessed/performed            Past Medical History:  Diagnosis Date   Arthritis    Cancer (HCC)    colon   Chronic low back pain    Is supposed to have surgery soon at L4-5   Diabetes mellitus without complication (HCC)    Dx'ed 09/01/22 with HgA1C 6.9   Hypertension    Knee pain    Past Surgical History:  Procedure Laterality Date   JOINT REPLACEMENT     knee 05/2011   KNEE SURGERY  08   rt arthroscopy   LAPAROSCOPIC PARTIAL COLECTOMY N/A 06/20/2020   Procedure: LAPAROSCOPIC PARTIAL COLECTOMY;  Surgeon: Debby Hila, MD;  Location: [facility];  Service: General;  Laterality: N/A;   TOTAL KNEE ARTHROPLASTY  05/05/2011   Procedure: TOTAL KNEE ARTHROPLASTY;  Surgeon: Toribio JULIANNA Chancy, MD;  Location: [facility] OR;  Service: Orthopedics;  Laterality: Right;  DR MURPHY WANTS 90 MINUTES FOR THIS CASE   Patient Active Problem List   Diagnosis Date Noted   Osteoarthritis of left knee 11/16/2023   Hepatic steatosis 06/23/2023   Morbid obesity with body mass index (BMI) of 40.0 to 44.9 in adult [facility]) 04/13/2023   Chronic pain of left knee 04/13/2023   Type 2 diabetes mellitus with other specified complication, without long-term current use of insulin (HCC) 09/01/2022   Aortic atherosclerosis 09/01/2022   B12 deficiency anemia 07/21/2020   Hypokalemia 06/21/2020   Transfusion of blood product refused for religious  reason 06/21/2020   Essential (primary) hypertension    Chronic low back pain    Cancer of sigmoid colon (HCC) 06/20/2020   Insomnia 07/23/2013    PCP: [name], [name], MD  REFERRING PROVIDER: [name], [name], MD  REFERRING DIAG: [phone] (ICD-10-CM) - Osteoarthritis of left knee, unspecified osteoarthritis type  THERAPY DIAG:  Acute pain of left knee  Stiffness of left knee, not elsewhere classified  Muscle weakness (generalized)  Cramp and spasm  Rationale for Evaluation and Treatment: Rehabilitation  ONSET DATE: 11/16/2023  SUBJECTIVE:   SUBJECTIVE STATEMENT: Patient reports he got a good report from his oncologist.  He states I just have a knee problem      PERTINENT HISTORY: Right TKA 9 years ago.   PAIN:  01/05/24 Are you having pain? Yes: NPRS scale: 4/10 Pain location: left knee Pain description: aching Aggravating factors: walking, inclines, steps, bending, stooping and squatting Relieving factors: rest,  Tylenol   PRECAUTIONS: None  RED FLAGS: None   WEIGHT BEARING RESTRICTIONS: No  FALLS:  Has patient fallen in last 6 months? No  LIVING ENVIRONMENT: Lives with: lives alone Lives in: House/apartment Stairs: No Has following equipment at home: None  OCCUPATION: Retired Surveyor, minerals  PLOF: Independent, Independent with basic ADLs, Independent with household mobility without device, Independent  with community mobility without device, Independent with homemaking with ambulation, Independent with gait, and Independent with transfers  PATIENT GOALS: Trying to buy some time before needing knee replacement.    NEXT MD VISIT: prn  OBJECTIVE:  Note: Objective measures were completed at Evaluation unless otherwise noted.  DIAGNOSTIC FINDINGS: none available  PATIENT SURVEYS:  LEFS  Extreme difficulty/unable (0), Quite a bit of difficulty (1), Moderate difficulty (2), Little difficulty (3), No difficulty (4) Survey date:  11/29/23 10/14  Any of  your usual work, housework or school activities 3 4  2. Usual hobbies, recreational or sporting activities 2 4  3. Getting into/out of the bath 4 4  4. Walking between rooms 4 4  5. Putting on socks/shoes 2 2  6. Squatting  2 2  7. Lifting an object, like a bag of groceries from the floor 2 3  8. Performing light activities around your home 4 4  9. Performing heavy activities around your home 2 4  10. Getting into/out of a car 1 3  11. Walking 2 blocks 0 2  12. Walking 1 mile 0 0  13. Going up/down 10 stairs (1 flight) 3 3  14. Standing for 1 hour 2 2  15.  sitting for 1 hour 2 3  16. Running on even ground 0 0  17. Running on uneven ground 0 0  18. Making sharp turns while running fast 0   19. Hopping  0 0  20. Rolling over in bed 2 4  Score total:  35/80 = 43.75%  46/80= 57.5%     COGNITION: Overall cognitive status: Within functional limits for tasks assessed     SENSATION: WFL   POSTURE: slight knee valgus on left  PALPATION: Moderate crepitus on left on seated knee flexion/extension  LOWER EXTREMITY ROM:  WFL  LOWER EXTREMITY MMT:  Generally 4+/5 with exception of left knee extension and flexion at end ranges 4/5  LOWER EXTREMITY SPECIAL TESTS:  Knee special tests: Patellafemoral grind test: positive , Step up/down test: positive , and Patella tap test (ballotable patella): positive   FUNCTIONAL TESTS:  Eval: 5 times sit to stand: 16.30 sec Timed up and go (TUG): 10.87sec  11/29/2023: 6 minute walk test:  906 ft with RPE of 5/10  12/08/2023: 5 times sit to stand:  15.21 sec Timed up and go (TUG):  11.85 sec  12/27/2023: 5 times sit to stand:  15.79 sec Timed up and go (TUG):  10.23 sec  GAIT: Distance walked: 30 feet Assistive device utilized: None Level of assistance: Modified independence Comments: antalgic start up and weight shifted to right                                                                                                                                 TREATMENT DATE:  01/05/2024: Nustep L5 x 6' with PT present to discuss status Supine quad sets x 20 Supine TKE  over half roll x 20 with 5 lb aw Supine TKE over pink foam roller x 20 with 5 lb aw Supine SAQ over black wedge x 20 with 5 lb aw SLR 2 x 10 with 5 lb aw  Sit to stand with 15lb kbell x 10  Squats with 15lb kbell - x 10    12/29/2023: Nustep L5 x 6' with PT present to discuss status Sit to stand with 15lb kbell x 10  Squats with 15lb kbell - x 10 Seated with 6# ankle weights:  LAQ, marching, and heel/toe raises.  2x10 each bilat Ambulation with 6# ankle weights to cancer gym and back x1 lap Seated hamstring curl with green tband 2x10 bilat Seated hip abduction clamshells with green tband 2x10 Seated modified dead lift with 15# kettlebell 2x10 Farmer's carry with 15# kettlebell down to cancer center and back, switching hands half way.   Standing hip abduction and hip extension x10 each bilat   12/27/23 Nustep L5 x 6' with PT present to discuss status 10th visit re-assessment Sit to stand with 15lb kbell x 10  Squats with 15lb kbell - x 10 Single leg dead left with 10 lb kb 2 x 10 Unable to do more exercise due to rest periods and time taken to fill out LEFS    PATIENT EDUCATION:  Education details: Initiated HEP and discussed appropriate time to do the knee replacement Person educated: Patient Education method: Explanation, Demonstration, Tactile cues, Verbal cues, and Handouts Education comprehension: verbalized understanding, returned demonstration, and verbal cues required  HOME EXERCISE PROGRAM: Access Code: [email or web address] URL: [email or web address] Date: 12/29/2023 Prepared by: Jarrell Menke  Exercises - Supine Heel Slide  - 1 x daily - 7 x weekly - 3 sets - 10 reps - Seated Long Arc Quad  - 1 x daily - 7 x weekly - 1 sets - 20 reps - Seated Knee Extension Stretch with Chair  - 1 x daily - 7 x weekly - 1 sets - 1  reps - Supine Quadricep Sets  - 1 x daily - 7 x weekly - 3 sets - 10 reps - Standing Hip Abduction with Counter Support  - 1 x daily - 7 x weekly - 2 sets - 10 reps - Standing Hip Extension with Counter Support  - 1 x daily - 7 x weekly - 2 sets - 10 reps  ASSESSMENT:  CLINICAL IMPRESSION: Mr Hicklin is progressing appropriately.  He was able to complete multi-angle quad work today without pain.  He needs constant redirection but is doing well overall.   Patient continues to require skilled PT to progress towards goal related activities.    OBJECTIVE IMPAIRMENTS: decreased balance, difficulty walking, decreased ROM, decreased strength, increased edema, increased fascial restrictions, increased muscle spasms, impaired flexibility, postural dysfunction, obesity, and pain.   ACTIVITY LIMITATIONS: carrying, lifting, bending, standing, squatting, sleeping, stairs, transfers, bed mobility, bathing, toileting, and dressing  PARTICIPATION LIMITATIONS: meal prep, cleaning, laundry, driving, shopping, community activity, yard work, and church  PERSONAL FACTORS: Fitness, Past/current experiences, Profession, and 1-2 comorbidities: obesity and Type 2 diabetes are also affecting patient's functional outcome.   REHAB POTENTIAL: Fair due to end stage  CLINICAL DECISION MAKING: Evolving/moderate complexity  EVALUATION COMPLEXITY: Moderate   GOALS: Goals reviewed with patient? Yes  SHORT TERM GOALS: Target date: 12/20/2023  Pain report to be no greater than 4/10  Baseline: Goal status: Met on 12/29/2023 (pt reports consistently a 4/10)  2.  Patient will be independent with initial HEP  Baseline:  Goal status: Met on 12/08/23   LONG TERM GOALS: Target date: 01/17/2024  Patient to report pain no greater than 2/10  Baseline:  Goal status: In progress  2.  Patient to be independent with advanced HEP including comprehensive gym routine Baseline:  Goal status: Ongoing  3.  Patient to be able to  bend, stoop and squat with pain no greater than 2/10  Baseline:  Goal status: INITIAL  4.  Patient to be able to ascend and descend steps without pain or no greater than 2/10  Baseline:  Goal status: INITIAL  5.  LEFS score to improve by 4-5 points Baseline: 35/80 = 43.75% Goal status: INITIAL  6.  Functional tests to improve by 2-3 seconds Baseline:  Goal status: Ongoing (see above)   PLAN:  PT FREQUENCY: 1-2x/week  PT DURATION: 8 weeks  PLANNED INTERVENTIONS: 97110-Therapeutic exercises, 97530- Therapeutic activity, 97112- Neuromuscular re-education, 97535- Self Care, 02859- Manual therapy, [phone]- Gait training, [phone]- Canalith repositioning, [id number]- Aquatic Therapy, [phone]- Electrical stimulation (unattended), [phone]- Electrical stimulation (manual), [id number]- Vasopneumatic device, [id number]- Ultrasound, [id number]- Ionotophoresis 4mg /ml Dexamethasone , 79439 (1-2 muscles), [id number] (3+ muscles)- Dry Needling, Patient/Family education, Balance training, Stair training, Taping, Joint mobilization, Vestibular training, DME instructions, Cryotherapy, and Moist heat  PLAN FOR NEXT SESSION: Nustep, progress quad rehab and hip strengthening   Jarrell Laming, PT, DPT 01/05/24, 5:48 PM  [facility] [address], [address] [place], [place] [address] Phone # [phone] Fax [phone]

## 2024-01-10 ENCOUNTER — Ambulatory Visit: Admitting: Rehabilitative and Restorative Service Providers"

## 2024-01-10 ENCOUNTER — Encounter: Payer: Self-pay | Admitting: Rehabilitative and Restorative Service Providers"

## 2024-01-10 DIAGNOSIS — R293 Abnormal posture: Secondary | ICD-10-CM

## 2024-01-10 DIAGNOSIS — R262 Difficulty in walking, not elsewhere classified: Secondary | ICD-10-CM

## 2024-01-10 DIAGNOSIS — M6281 Muscle weakness (generalized): Secondary | ICD-10-CM

## 2024-01-10 DIAGNOSIS — M25662 Stiffness of left knee, not elsewhere classified: Secondary | ICD-10-CM

## 2024-01-10 DIAGNOSIS — R252 Cramp and spasm: Secondary | ICD-10-CM

## 2024-01-10 DIAGNOSIS — M25562 Pain in left knee: Secondary | ICD-10-CM | POA: Diagnosis not present

## 2024-01-10 NOTE — Therapy (Signed)
 OUTPATIENT PHYSICAL THERAPY TREATMENT NOTE     Patient Name: Eric Macdonald MRN: [id number] DOB:1955-04-26, 68 y.o., male Today's Date: 01/10/2024  END OF SESSION:  PT End of Session - 01/10/24 1233     Visit Number 13    Date for Recertification  01/17/24    Authorization Type [employer] MC dual complete- no auth required    Progress Note Due on Visit 20    PT Start Time 1230    PT Stop Time 1310    PT Time Calculation (min) 40 min    Activity Tolerance Patient tolerated treatment well    Behavior During Therapy WFL for tasks assessed/performed            Past Medical History:  Diagnosis Date   Arthritis    Cancer (HCC)    colon   Chronic low back pain    Is supposed to have surgery soon at L4-5   Diabetes mellitus without complication (HCC)    Dx'ed 09/01/22 with HgA1C 6.9   Hypertension    Knee pain    Past Surgical History:  Procedure Laterality Date   JOINT REPLACEMENT     knee 05/2011   KNEE SURGERY  08   rt arthroscopy   LAPAROSCOPIC PARTIAL COLECTOMY N/A 06/20/2020   Procedure: LAPAROSCOPIC PARTIAL COLECTOMY;  Surgeon: Debby Hila, MD;  Location: [facility];  Service: General;  Laterality: N/A;   TOTAL KNEE ARTHROPLASTY  05/05/2011   Procedure: TOTAL KNEE ARTHROPLASTY;  Surgeon: Toribio JULIANNA Chancy, MD;  Location: [facility] OR;  Service: Orthopedics;  Laterality: Right;  DR MURPHY WANTS 90 MINUTES FOR THIS CASE   Patient Active Problem List   Diagnosis Date Noted   Osteoarthritis of left knee 11/16/2023   Hepatic steatosis 06/23/2023   Morbid obesity with body mass index (BMI) of 40.0 to 44.9 in adult [facility]) 04/13/2023   Chronic pain of left knee 04/13/2023   Type 2 diabetes mellitus with other specified complication, without long-term current use of insulin (HCC) 09/01/2022   Aortic atherosclerosis 09/01/2022   B12 deficiency anemia 07/21/2020   Hypokalemia 06/21/2020   Transfusion of blood product refused for religious reason 06/21/2020   Essential (primary)  hypertension    Chronic low back pain    Cancer of sigmoid colon (HCC) 06/20/2020   Insomnia 07/23/2013    PCP: Jordan, [name], MD  REFERRING PROVIDER: Jordan, [name], MD  REFERRING DIAG: [phone] (ICD-10-CM) - Osteoarthritis of left knee, unspecified osteoarthritis type  THERAPY DIAG:  Acute pain of left knee  Stiffness of left knee, not elsewhere classified  Muscle weakness (generalized)  Cramp and spasm  Difficulty in walking, not elsewhere classified  Abnormal posture  Rationale for Evaluation and Treatment: Rehabilitation  ONSET DATE: 11/16/2023  SUBJECTIVE:   SUBJECTIVE STATEMENT: Patient reports he is feeling the same level of pain.   PERTINENT HISTORY: Right TKA 9 years ago.   PAIN:  01/10/24 Are you having pain? Yes: NPRS scale: 4/10 Pain location: left knee Pain description: aching Aggravating factors: walking, inclines, steps, bending, stooping and squatting Relieving factors: rest,  Tylenol   PRECAUTIONS: None  RED FLAGS: None   WEIGHT BEARING RESTRICTIONS: No  FALLS:  Has patient fallen in last 6 months? No  LIVING ENVIRONMENT: Lives with: lives alone Lives in: House/apartment Stairs: No Has following equipment at home: None  OCCUPATION: Retired surveyor, minerals  PLOF: Independent, Independent with basic ADLs, Independent with household mobility without device, Independent with community mobility without device, Independent with homemaking with ambulation, Independent  with gait, and Independent with transfers  PATIENT GOALS: Trying to buy some time before needing knee replacement.    NEXT MD VISIT: prn  OBJECTIVE:  Note: Objective measures were completed at Evaluation unless otherwise noted.  DIAGNOSTIC FINDINGS: none available  PATIENT SURVEYS:  LEFS  Extreme difficulty/unable (0), Quite a bit of difficulty (1), Moderate difficulty (2), Little difficulty (3), No difficulty (4) Survey date:  11/29/23 10/14  Any of your usual work,  housework or school activities 3 4  2. Usual hobbies, recreational or sporting activities 2 4  3. Getting into/out of the bath 4 4  4. Walking between rooms 4 4  5. Putting on socks/shoes 2 2  6. Squatting  2 2  7. Lifting an object, like a bag of groceries from the floor 2 3  8. Performing light activities around your home 4 4  9. Performing heavy activities around your home 2 4  10. Getting into/out of a car 1 3  11. Walking 2 blocks 0 2  12. Walking 1 mile 0 0  13. Going up/down 10 stairs (1 flight) 3 3  14. Standing for 1 hour 2 2  15.  sitting for 1 hour 2 3  16. Running on even ground 0 0  17. Running on uneven ground 0 0  18. Making sharp turns while running fast 0   19. Hopping  0 0  20. Rolling over in bed 2 4  Score total:  35/80 = 43.75%  46/80= 57.5%     COGNITION: Overall cognitive status: Within functional limits for tasks assessed     SENSATION: WFL   POSTURE: slight knee valgus on left  PALPATION: Moderate crepitus on left on seated knee flexion/extension  LOWER EXTREMITY ROM:  WFL  LOWER EXTREMITY MMT:  Generally 4+/5 with exception of left knee extension and flexion at end ranges 4/5  LOWER EXTREMITY SPECIAL TESTS:  Knee special tests: Patellafemoral grind test: positive , Step up/down test: positive , and Patella tap test (ballotable patella): positive   FUNCTIONAL TESTS:  Eval: 5 times sit to stand: 16.30 sec Timed up and go (TUG): 10.87sec  11/29/2023: 6 minute walk test:  906 ft with RPE of 5/10  12/08/2023: 5 times sit to stand:  15.21 sec Timed up and go (TUG):  11.85 sec  12/27/2023: 5 times sit to stand:  15.79 sec Timed up and go (TUG):  10.23 sec  01/10/2024: 5 times sit to stand: 13.16 sec Timed up and go (TUG):  10.09 sec attempt 1, 8.45 sec on attempt 2 6 minute walk test: 1,103 ft  GAIT: Distance walked: 30 feet Assistive device utilized: None Level of assistance: Modified independence Comments: antalgic start up  and weight shifted to right                                                                                                                                TREATMENT DATE:  01/10/2024: Nustep L5 x 6' with PT present to discuss status 5 times sit to/from stand:  13.16 sec, TUG 8.45 sec 6 minute walk test: 1,103 ft Seated hamstring curl with green tband 2x10 bilat Seated hip abduction clamshells with green tband 2x10 Seated quad set x20 bilat with cuing for improved technique Seated march up and over small cone with 6# dumbbell on thigh x20 bilat   01/05/2024: Nustep L5 x 6' with PT present to discuss status Supine quad sets x 20 Supine TKE over half roll x 20 with 5 lb aw Supine TKE over pink foam roller x 20 with 5 lb aw Supine SAQ over black wedge x 20 with 5 lb aw SLR 2 x 10 with 5 lb aw  Sit to stand with 15lb kbell x 10  Squats with 15lb kbell - x 10    12/29/2023: Nustep L5 x 6' with PT present to discuss status Sit to stand with 15lb kbell x 10  Squats with 15lb kbell - x 10 Seated with 6# ankle weights:  LAQ, marching, and heel/toe raises.  2x10 each bilat Ambulation with 6# ankle weights to cancer gym and back x1 lap Seated hamstring curl with green tband 2x10 bilat Seated hip abduction clamshells with green tband 2x10 Seated modified dead lift with 15# kettlebell 2x10 Farmer's carry with 15# kettlebell down to cancer center and back, switching hands half way.   Standing hip abduction and hip extension x10 each bilat    PATIENT EDUCATION:  Education details: Initiated HEP and discussed appropriate time to do the knee replacement Person educated: Patient Education method: Explanation, Demonstration, Tactile cues, Verbal cues, and Handouts Education comprehension: verbalized understanding, returned demonstration, and verbal cues required  HOME EXERCISE PROGRAM: Access Code: [email or web address] URL: [email or web address] Date: 12/29/2023 Prepared by:  Jarrell Lakeem Rozo  Exercises - Supine Heel Slide  - 1 x daily - 7 x weekly - 3 sets - 10 reps - Seated Long Arc Quad  - 1 x daily - 7 x weekly - 1 sets - 20 reps - Seated Knee Extension Stretch with Chair  - 1 x daily - 7 x weekly - 1 sets - 1 reps - Supine Quadricep Sets  - 1 x daily - 7 x weekly - 3 sets - 10 reps - Standing Hip Abduction with Counter Support  - 1 x daily - 7 x weekly - 2 sets - 10 reps - Standing Hip Extension with Counter Support  - 1 x daily - 7 x weekly - 2 sets - 10 reps  ASSESSMENT:  CLINICAL IMPRESSION: Mr Monnier reporting no change in his pain.  Able to perform functional testing again and patient with improved scores noted on 5 times sit to stand, as well as TUG.  Additionally, patient able to ambulate increased distance on his 6 min walk test.  Patient with some difficulty with quad set and requires cuing for improved motion.  Patient reported hip fatigue with seated step over and back of small cone with 6# dumbbell on his knee.  Patient will have PT reassessment in next couple visits to ascertain for continued PT vs discharge with HEP.  OBJECTIVE IMPAIRMENTS: decreased balance, difficulty walking, decreased ROM, decreased strength, increased edema, increased fascial restrictions, increased muscle spasms, impaired flexibility, postural dysfunction, obesity, and pain.   ACTIVITY LIMITATIONS: carrying, lifting, bending, standing, squatting, sleeping, stairs, transfers, bed mobility, bathing, toileting, and dressing  PARTICIPATION LIMITATIONS: meal prep, cleaning, laundry, driving, shopping, community activity, yard work, and church  PERSONAL  FACTORS: Fitness, Past/current experiences, Profession, and 1-2 comorbidities: obesity and Type 2 diabetes are also affecting patient's functional outcome.   REHAB POTENTIAL: Fair due to end stage  CLINICAL DECISION MAKING: Evolving/moderate complexity  EVALUATION COMPLEXITY: Moderate   GOALS: Goals reviewed with patient?  Yes  SHORT TERM GOALS: Target date: 12/20/2023  Pain report to be no greater than 4/10  Baseline: Goal status: Met on 12/29/2023 (pt reports consistently a 4/10)  2.  Patient will be independent with initial HEP  Baseline:  Goal status: Met on 12/08/23   LONG TERM GOALS: Target date: 01/17/2024  Patient to report pain no greater than 2/10  Baseline:  Goal status: In progress  2.  Patient to be independent with advanced HEP including comprehensive gym routine Baseline:  Goal status: Ongoing  3.  Patient to be able to bend, stoop and squat with pain no greater than 2/10  Baseline:  Goal status: Ongoing  4.  Patient to be able to ascend and descend steps without pain or no greater than 2/10  Baseline:  Goal status: Ongoing  5.  LEFS score to improve by 4-5 points Baseline: 35/80 = 43.75% Goal status: Met on 12/27/23  6.  Functional tests to improve by 2-3 seconds Baseline:  Goal status: Met on 01/10/24   PLAN:  PT FREQUENCY: 1-2x/week  PT DURATION: 8 weeks  PLANNED INTERVENTIONS: 97110-Therapeutic exercises, 97530- Therapeutic activity, 97112- Neuromuscular re-education, 97535- Self Care, 02859- Manual therapy, [phone]- Gait training, [phone]- Canalith repositioning, [id number]- Aquatic Therapy, [phone]- Electrical stimulation (unattended), [phone]- Electrical stimulation (manual), [id number]- Vasopneumatic device, [id number]- Ultrasound, [id number]- Ionotophoresis 4mg /ml Dexamethasone , 79439 (1-2 muscles), 20561 (3+ muscles)- Dry Needling, Patient/Family education, Balance training, Stair training, Taping, Joint mobilization, Vestibular training, DME instructions, Cryotherapy, and Moist heat  PLAN FOR NEXT SESSION: Nustep, progress quad rehab and hip strengthening, upcoming reassessment   Jarrell Laming, PT, DPT 01/10/24, 1:26 PM  [facility] [address] [place], [place] [address] Phone # [phone] Fax [phone]

## 2024-01-12 ENCOUNTER — Ambulatory Visit

## 2024-01-12 DIAGNOSIS — M25562 Pain in left knee: Secondary | ICD-10-CM | POA: Diagnosis not present

## 2024-01-12 DIAGNOSIS — M6281 Muscle weakness (generalized): Secondary | ICD-10-CM

## 2024-01-12 DIAGNOSIS — M25662 Stiffness of left knee, not elsewhere classified: Secondary | ICD-10-CM

## 2024-01-12 DIAGNOSIS — R262 Difficulty in walking, not elsewhere classified: Secondary | ICD-10-CM

## 2024-01-12 DIAGNOSIS — R252 Cramp and spasm: Secondary | ICD-10-CM

## 2024-01-12 DIAGNOSIS — R293 Abnormal posture: Secondary | ICD-10-CM

## 2024-01-12 NOTE — Therapy (Unsigned)
 OUTPATIENT PHYSICAL THERAPY TREATMENT NOTE     Patient Name: Eric Macdonald MRN: [id number] DOB:04/09/1955, 68 y.o., male Today's Date: 01/13/2024  END OF SESSION:  PT End of Session - 01/12/24 1236     Visit Number 14    Date for Recertification  01/17/24    Authorization Type [employer] MC dual complete- no auth required    Authorization Time Period No auth required    Progress Note Due on Visit 20    PT Start Time 1230    PT Stop Time 1315    PT Time Calculation (min) 45 min    Activity Tolerance Patient tolerated treatment well    Behavior During Therapy WFL for tasks assessed/performed            Past Medical History:  Diagnosis Date   Arthritis    Cancer (HCC)    colon   Chronic low back pain    Is supposed to have surgery soon at L4-5   Diabetes mellitus without complication (HCC)    Dx'ed 09/01/22 with HgA1C 6.9   Hypertension    Knee pain    Past Surgical History:  Procedure Laterality Date   JOINT REPLACEMENT     knee 05/2011   KNEE SURGERY  08   rt arthroscopy   LAPAROSCOPIC PARTIAL COLECTOMY N/A 06/20/2020   Procedure: LAPAROSCOPIC PARTIAL COLECTOMY;  Surgeon: Debby Hila, MD;  Location: [facility];  Service: General;  Laterality: N/A;   TOTAL KNEE ARTHROPLASTY  05/05/2011   Procedure: TOTAL KNEE ARTHROPLASTY;  Surgeon: Toribio JULIANNA Chancy, MD;  Location: [facility] OR;  Service: Orthopedics;  Laterality: Right;  DR MURPHY WANTS 90 MINUTES FOR THIS CASE   Patient Active Problem List   Diagnosis Date Noted   Osteoarthritis of left knee 11/16/2023   Hepatic steatosis 06/23/2023   Morbid obesity with body mass index (BMI) of 40.0 to 44.9 in adult [facility] [place]) 04/13/2023   Chronic pain of left knee 04/13/2023   Type 2 diabetes mellitus with other specified complication, without long-term current use of insulin (HCC) 09/01/2022   Aortic atherosclerosis 09/01/2022   B12 deficiency anemia 07/21/2020   Hypokalemia 06/21/2020   Transfusion of blood product refused for religious  reason 06/21/2020   Essential (primary) hypertension    Chronic low back pain    Cancer of sigmoid colon (HCC) 06/20/2020   Insomnia 07/23/2013    PCP: Jordan, [name], MD  REFERRING PROVIDER: Jordan, [name], MD  REFERRING DIAG: [phone] (ICD-10-CM) - Osteoarthritis of left knee, unspecified osteoarthritis type  THERAPY DIAG:  Acute pain of left knee  Stiffness of left knee, not elsewhere classified  Muscle weakness (generalized)  Difficulty in walking, not elsewhere classified  Cramp and spasm  Abnormal posture  Rationale for Evaluation and Treatment: Rehabilitation  ONSET DATE: 11/16/2023  SUBJECTIVE:   SUBJECTIVE STATEMENT: Patient reports he is doing well.  Knee pain is manageable.  He is able to negotiate steps with increased ease.     PERTINENT HISTORY: Right TKA 9 years ago.   PAIN:  01/12/24 Are you having pain? Yes: NPRS scale: 4/10 Pain location: left knee Pain description: aching Aggravating factors: walking, inclines, steps, bending, stooping and squatting Relieving factors: rest,  Tylenol   PRECAUTIONS: None  RED FLAGS: None   WEIGHT BEARING RESTRICTIONS: No  FALLS:  Has patient fallen in last 6 months? No  LIVING ENVIRONMENT: Lives with: lives alone Lives in: House/apartment Stairs: No Has following equipment at home: None  OCCUPATION: Retired surveyor, minerals  PLOF: Independent,  Independent with basic ADLs, Independent with household mobility without device, Independent with community mobility without device, Independent with homemaking with ambulation, Independent with gait, and Independent with transfers  PATIENT GOALS: Trying to buy some time before needing knee replacement.    NEXT MD VISIT: prn  OBJECTIVE:  Note: Objective measures were completed at Evaluation unless otherwise noted.  DIAGNOSTIC FINDINGS: none available  PATIENT SURVEYS:  LEFS  Extreme difficulty/unable (0), Quite a bit of difficulty (1), Moderate difficulty  (2), Little difficulty (3), No difficulty (4) Survey date:  11/29/23 10/14  Any of your usual work, housework or school activities 3 4  2. Usual hobbies, recreational or sporting activities 2 4  3. Getting into/out of the bath 4 4  4. Walking between rooms 4 4  5. Putting on socks/shoes 2 2  6. Squatting  2 2  7. Lifting an object, like a bag of groceries from the floor 2 3  8. Performing light activities around your home 4 4  9. Performing heavy activities around your home 2 4  10. Getting into/out of a car 1 3  11. Walking 2 blocks 0 2  12. Walking 1 mile 0 0  13. Going up/down 10 stairs (1 flight) 3 3  14. Standing for 1 hour 2 2  15.  sitting for 1 hour 2 3  16. Running on even ground 0 0  17. Running on uneven ground 0 0  18. Making sharp turns while running fast 0   19. Hopping  0 0  20. Rolling over in bed 2 4  Score total:  35/80 = 43.75%  46/80= 57.5%     COGNITION: Overall cognitive status: Within functional limits for tasks assessed     SENSATION: WFL   POSTURE: slight knee valgus on left  PALPATION: Moderate crepitus on left on seated knee flexion/extension  LOWER EXTREMITY ROM:  WFL  LOWER EXTREMITY MMT:  Generally 4+/5 with exception of left knee extension and flexion at end ranges 4/5  LOWER EXTREMITY SPECIAL TESTS:  Knee special tests: Patellafemoral grind test: positive , Step up/down test: positive , and Patella tap test (ballotable patella): positive   FUNCTIONAL TESTS:  Eval: 5 times sit to stand: 16.30 sec Timed up and go (TUG): 10.87sec  11/29/2023: 6 minute walk test:  906 ft with RPE of 5/10  12/08/2023: 5 times sit to stand:  15.21 sec Timed up and go (TUG):  11.85 sec  12/27/2023: 5 times sit to stand:  15.79 sec Timed up and go (TUG):  10.23 sec  01/10/2024: 5 times sit to stand: 13.16 sec Timed up and go (TUG):  10.09 sec attempt 1, 8.45 sec on attempt 2 6 minute walk test: 1,103 ft  GAIT: Distance walked: 30  feet Assistive device utilized: None Level of assistance: Modified independence Comments: antalgic start up and weight shifted to right  TREATMENT DATE:  01/12/2024: Nustep L5 x 6' with PT present to discuss status Sit to stand with 15lb kb Squat to table with 15 lb kb Lunge to BOSU x 10 each LE fwd and lateral (had some pain on lateral lunge to left) Step up on bottom step at stairs x 20 each LE fwd, x 20 each LE lateral (continued to have pain in left knee) Ice to left knee in supine x 10   01/10/2024: Nustep L5 x 6' with PT present to discuss status 5 times sit to/from stand:  13.16 sec, TUG 8.45 sec 6 minute walk test: 1,103 ft Seated hamstring curl with green tband 2x10 bilat Seated hip abduction clamshells with green tband 2x10 Seated quad set x20 bilat with cuing for improved technique Seated march up and over small cone with 6# dumbbell on thigh x20 bilat   01/05/2024: Nustep L5 x 6' with PT present to discuss status Supine quad sets x 20 Supine TKE over half roll x 20 with 5 lb aw Supine TKE over pink foam roller x 20 with 5 lb aw Supine SAQ over black wedge x 20 with 5 lb aw SLR 2 x 10 with 5 lb aw  Sit to stand with 15lb kbell x 10  Squats with 15lb kbell - x 10    12/29/2023: Nustep L5 x 6' with PT present to discuss status Sit to stand with 15lb kbell x 10  Squats with 15lb kbell - x 10 Seated with 6# ankle weights:  LAQ, marching, and heel/toe raises.  2x10 each bilat Ambulation with 6# ankle weights to cancer [facility] and back x1 lap Seated hamstring curl with green tband 2x10 bilat Seated hip abduction clamshells with green tband 2x10 Seated modified dead lift with 15# kettlebell 2x10 Farmer's carry with 15# kettlebell down to cancer center and back, switching hands half way.   Standing hip abduction and hip extension x10 each  bilat    PATIENT EDUCATION:  Education details: Initiated HEP and discussed appropriate time to do the knee replacement Person educated: Patient Education method: Explanation, Demonstration, Tactile cues, Verbal cues, and Handouts Education comprehension: verbalized understanding, returned demonstration, and verbal cues required  HOME EXERCISE PROGRAM: Access Code: [email or web address] URL: [email or web address] Date: 12/29/2023 Prepared by: Jarrell Menke  Exercises - Supine Heel Slide  - 1 x daily - 7 x weekly - 3 sets - 10 reps - Seated Long Arc Quad  - 1 x daily - 7 x weekly - 1 sets - 20 reps - Seated Knee Extension Stretch with Chair  - 1 x daily - 7 x weekly - 1 sets - 1 reps - Supine Quadricep Sets  - 1 x daily - 7 x weekly - 3 sets - 10 reps - Standing Hip Abduction with Counter Support  - 1 x daily - 7 x weekly - 2 sets - 10 reps - Standing Hip Extension with Counter Support  - 1 x daily - 7 x weekly - 2 sets - 10 reps  ASSESSMENT:  CLINICAL IMPRESSION: Mr Ager still reporting no change in his pain.  However, he is reporting increased strength and function.  He seems to be more willing to get out and do things vs staying at home.   Patient will have PT reassessment next visit to ascertain for continued PT vs discharge with HEP.  OBJECTIVE IMPAIRMENTS: decreased balance, difficulty walking, decreased ROM, decreased strength, increased edema, increased fascial restrictions, increased muscle spasms, impaired flexibility, postural dysfunction, obesity, and pain.  ACTIVITY LIMITATIONS: carrying, lifting, bending, standing, squatting, sleeping, stairs, transfers, bed mobility, bathing, toileting, and dressing  PARTICIPATION LIMITATIONS: meal prep, cleaning, laundry, driving, shopping, community activity, yard work, and church  PERSONAL FACTORS: Fitness, Past/current experiences, Profession, and 1-2 comorbidities: obesity and Type 2 diabetes are also affecting patient's  functional outcome.   REHAB POTENTIAL: Fair due to end stage  CLINICAL DECISION MAKING: Evolving/moderate complexity  EVALUATION COMPLEXITY: Moderate   GOALS: Goals reviewed with patient? Yes  SHORT TERM GOALS: Target date: 12/20/2023  Pain report to be no greater than 4/10  Baseline: Goal status: Met on 12/29/2023 (pt reports consistently a 4/10)  2.  Patient will be independent with initial HEP  Baseline:  Goal status: Met on 12/08/23   LONG TERM GOALS: Target date: 01/17/2024  Patient to report pain no greater than 2/10  Baseline:  Goal status: In progress  2.  Patient to be independent with advanced HEP including comprehensive gym routine Baseline:  Goal status: Ongoing  3.  Patient to be able to bend, stoop and squat with pain no greater than 2/10  Baseline:  Goal status: Ongoing  4.  Patient to be able to ascend and descend steps without pain or no greater than 2/10  Baseline:  Goal status: Ongoing  5.  LEFS score to improve by 4-5 points Baseline: 35/80 = 43.75% Goal status: Met on 12/27/23  6.  Functional tests to improve by 2-3 seconds Baseline:  Goal status: Met on 01/10/24   PLAN:  PT FREQUENCY: 1-2x/week  PT DURATION: 8 weeks  PLANNED INTERVENTIONS: 97110-Therapeutic exercises, 97530- Therapeutic activity, 97112- Neuromuscular re-education, 97535- Self Care, 02859- Manual therapy, [phone]- Gait training, [phone]- Canalith repositioning, [id number]- Aquatic Therapy, [phone]- Electrical stimulation (unattended), [phone]- Electrical stimulation (manual), [id number]- Vasopneumatic device, [id number]- Ultrasound, [id number]- Ionotophoresis 4mg /ml Dexamethasone , 79439 (1-2 muscles), [id number] (3+ muscles)- Dry Needling, Patient/Family education, Balance training, Stair training, Taping, Joint mobilization, Vestibular training, DME instructions, Cryotherapy, and Moist heat  PLAN FOR NEXT SESSION: Re-assess Nustep, progress quad rehab and hip strengthening, upcoming  reassessment   Breianna Delfino B. Journe Hallmark, PT 01/13/24 7:23 AM [facility] [address] [place], [place] [address] Phone # [phone] Fax [phone]

## 2024-01-17 ENCOUNTER — Ambulatory Visit: Attending: Family Medicine

## 2024-01-17 DIAGNOSIS — M25562 Pain in left knee: Secondary | ICD-10-CM | POA: Insufficient documentation

## 2024-01-17 DIAGNOSIS — R262 Difficulty in walking, not elsewhere classified: Secondary | ICD-10-CM | POA: Diagnosis present

## 2024-01-17 DIAGNOSIS — R293 Abnormal posture: Secondary | ICD-10-CM | POA: Insufficient documentation

## 2024-01-17 DIAGNOSIS — R252 Cramp and spasm: Secondary | ICD-10-CM | POA: Insufficient documentation

## 2024-01-17 DIAGNOSIS — M25662 Stiffness of left knee, not elsewhere classified: Secondary | ICD-10-CM | POA: Diagnosis present

## 2024-01-17 DIAGNOSIS — M6281 Muscle weakness (generalized): Secondary | ICD-10-CM | POA: Diagnosis present

## 2024-01-17 NOTE — Therapy (Signed)
 OUTPATIENT PHYSICAL THERAPY RECERT NOTE     Patient Name: Eric Macdonald MRN: [id number] DOB:[birth year], 68 y.o., male Today's Date: 01/17/2024  END OF SESSION:  PT End of Session - 01/17/24 1212     Visit Number 15    Date for Recertification  01/17/24    Authorization Type [employer] MC dual complete- no auth required    Authorization Time Period No auth required    Progress Note Due on Visit 20    PT Start Time 1145    PT Stop Time 1230    PT Time Calculation (min) 45 min    Activity Tolerance Patient tolerated treatment well    Behavior During Therapy WFL for tasks assessed/performed            Past Medical History:  Diagnosis Date   Arthritis    Cancer (HCC)    colon   Chronic low back pain    Is supposed to have surgery soon at L4-5   Diabetes mellitus without complication (HCC)    Dx'ed 09/01/22 with HgA1C 6.9   Hypertension    Knee pain    Past Surgical History:  Procedure Laterality Date   JOINT REPLACEMENT     knee 05/2011   KNEE SURGERY  08   rt arthroscopy   LAPAROSCOPIC PARTIAL COLECTOMY N/A 06/20/2020   Procedure: LAPAROSCOPIC PARTIAL COLECTOMY;  Surgeon: Debby Hila, MD;  Location: [facility];  Service: General;  Laterality: N/A;   TOTAL KNEE ARTHROPLASTY  05/05/2011   Procedure: TOTAL KNEE ARTHROPLASTY;  Surgeon: Toribio JULIANNA Chancy, MD;  Location: [facility] OR;  Service: Orthopedics;  Laterality: Right;  DR MURPHY WANTS 90 MINUTES FOR THIS CASE   Patient Active Problem List   Diagnosis Date Noted   Osteoarthritis of left knee 11/16/2023   Hepatic steatosis 06/23/2023   Morbid obesity with body mass index (BMI) of 40.0 to 44.9 in adult [facility]) 04/13/2023   Chronic pain of left knee 04/13/2023   Type 2 diabetes mellitus with other specified complication, without long-term current use of insulin (HCC) 09/01/2022   Aortic atherosclerosis 09/01/2022   B12 deficiency anemia 07/21/2020   Hypokalemia 06/21/2020   Transfusion of blood product refused for religious  reason 06/21/2020   Essential (primary) hypertension    Chronic low back pain    Cancer of sigmoid colon (HCC) 06/20/2020   Insomnia 07/23/2013    PCP: Jordan, [name], MD  REFERRING PROVIDER: Jordan, [name], MD  REFERRING DIAG: [phone] (ICD-10-CM) - Osteoarthritis of left knee, unspecified osteoarthritis type  THERAPY DIAG:  Acute pain of left knee - Plan: PT plan of care cert/re-cert  Stiffness of left knee, not elsewhere classified - Plan: PT plan of care cert/re-cert  Muscle weakness (generalized) - Plan: PT plan of care cert/re-cert  Difficulty in walking, not elsewhere classified - Plan: PT plan of care cert/re-cert  Cramp and spasm - Plan: PT plan of care cert/re-cert  Abnormal posture - Plan: PT plan of care cert/re-cert  Rationale for Evaluation and Treatment: Rehabilitation  ONSET DATE: 11/16/2023  SUBJECTIVE:   SUBJECTIVE STATEMENT: Patient reports he is concerned over when to do his surgery.  He lives alone.  He could go to [place]  and have it done and stay with his son, He could go to [place]  but his daughter lives in a [place] and has 71 steps.  He would ideally like to stay at home but will need assistance post surgery.  He feels he is doing well but wants to be  sure he is strong enough and prepared for surgery.  We had a lengthy discussion about his previous difficulty with dependence on narcotic for his other knee.  He states he will not be taking any narcotics when he has his surgery, which further complicates his post surgical rehab.    PERTINENT HISTORY: Right TKA 9 years ago.   PAIN:  01/17/24 Are you having pain? Yes: NPRS scale: 4/10 Pain location: left knee Pain description: aching Aggravating factors: walking, inclines, steps, bending, stooping and squatting Relieving factors: rest,  Tylenol   PRECAUTIONS: None  RED FLAGS: None   WEIGHT BEARING RESTRICTIONS: No  FALLS:  Has patient fallen in last 6 months? No  LIVING ENVIRONMENT: Lives  with: lives alone Lives in: House/apartment Stairs: No Has following equipment at home: None  OCCUPATION: Retired surveyor, minerals  PLOF: Independent, Independent with basic ADLs, Independent with household mobility without device, Independent with community mobility without device, Independent with homemaking with ambulation, Independent with gait, and Independent with transfers  PATIENT GOALS: Trying to buy some time before needing knee replacement.    NEXT MD VISIT: prn  OBJECTIVE:  Note: Objective measures were completed at Evaluation unless otherwise noted.  DIAGNOSTIC FINDINGS: none available  PATIENT SURVEYS:  LEFS  Extreme difficulty/unable (0), Quite a bit of difficulty (1), Moderate difficulty (2), Little difficulty (3), No difficulty (4) Survey date:  11/29/23 10/14 01/17/24  Any of your usual work, housework or school activities 3 4   2. Usual hobbies, recreational or sporting activities 2 4   3. Getting into/out of the bath 4 4   4. Walking between rooms 4 4   5. Putting on socks/shoes 2 2   6. Squatting  2 2   7. Lifting an object, like a bag of groceries from the floor 2 3   8. Performing light activities around your home 4 4   9. Performing heavy activities around your home 2 4   10. Getting into/out of a car 1 3   11. Walking 2 blocks 0 2   12. Walking 1 mile 0 0   13. Going up/down 10 stairs (1 flight) 3 3   14. Standing for 1 hour 2 2   15.  sitting for 1 hour 2 3   16. Running on even ground 0 0   17. Running on uneven ground 0 0   18. Making sharp turns while running fast 0    19. Hopping  0 0   20. Rolling over in bed 2 4   Score total:  35/80 = 43.75%  46/80= 57.5% 58/80= 72.5%     COGNITION: Overall cognitive status: Within functional limits for tasks assessed     SENSATION: WFL   POSTURE: slight knee valgus on left  PALPATION: Moderate crepitus on left on seated knee flexion/extension  LOWER EXTREMITY ROM:  WFL  LOWER EXTREMITY  MMT:  5/5 throughout left LE  LOWER EXTREMITY SPECIAL TESTS:  Knee special tests: Patellafemoral grind test: positive , Step up/down test: positive , and Patella tap test (ballotable patella): positive   FUNCTIONAL TESTS:  Eval: 5 times sit to stand: 16.30 sec Timed up and go (TUG): 10.87sec  11/29/2023: 6 minute walk test:  906 ft with RPE of 5/10  12/08/2023: 5 times sit to stand:  15.21 sec Timed up and go (TUG):  11.85 sec  12/27/2023: 5 times sit to stand:  15.79 sec Timed up and go (TUG):  10.23 sec  01/10/2024: 5 times sit to  stand: 13.16 sec Timed up and go (TUG):  10.09 sec attempt 1, 8.45 sec on attempt 2 6 minute walk test: 1,103 ft  GAIT: Distance walked: 30 feet Assistive device utilized: None Level of assistance: Modified independence Comments: antalgic start up and weight shifted to right                                                                                                                                TREATMENT DATE:  01/17/2024: Nustep L5 x 6' with PT present to discuss status Patient continues to require a lengthy amount of discussion about when to do his surgery, his plan and who he will have as a reliable cg, where to have surgery, post surgical considerations and his history of narcotic dependence.  Sit to stand with 15lb kb Squat to table with 15 lb kb LAQ x 20 with 6 lb aw Marching x 20 with 6 lb aw  01/12/2024: Nustep L5 x 6' with PT present to discuss status Sit to stand with 15lb kb Squat to table with 15 lb kb Lunge to BOSU x 10 each LE fwd and lateral (had some pain on lateral lunge to left) Step up on bottom step at stairs x 20 each LE fwd, x 20 each LE lateral (continued to have pain in left knee) Ice to left knee in supine x 10   01/10/2024: Nustep L5 x 6' with PT present to discuss status 5 times sit to/from stand:  13.16 sec, TUG 8.45 sec 6 minute walk test: 1,103 ft Seated hamstring curl with green tband 2x10  bilat Seated hip abduction clamshells with green tband 2x10 Seated quad set x20 bilat with cuing for improved technique Seated march up and over small cone with 6# dumbbell on thigh x20 bilat   01/05/2024: Nustep L5 x 6' with PT present to discuss status Supine quad sets x 20 Supine TKE over half roll x 20 with 5 lb aw Supine TKE over pink foam roller x 20 with 5 lb aw Supine SAQ over black wedge x 20 with 5 lb aw SLR 2 x 10 with 5 lb aw  Sit to stand with 15lb kbell x 10  Squats with 15lb kbell - x 10    PATIENT EDUCATION:  Education details: Initiated HEP and discussed appropriate time to do the knee replacement Person educated: Patient Education method: Explanation, Demonstration, Tactile cues, Verbal cues, and Handouts Education comprehension: verbalized understanding, returned demonstration, and verbal cues required  HOME EXERCISE PROGRAM: Access Code: [email or web address] URL: [email or web address] Date: 12/29/2023 Prepared by: Jarrell Menke  Exercises - Supine Heel Slide  - 1 x daily - 7 x weekly - 3 sets - 10 reps - Seated Long Arc Quad  - 1 x daily - 7 x weekly - 1 sets - 20 reps - Seated Knee Extension Stretch with Chair  - 1 x daily - 7 x weekly - 1 sets - 1 reps -  Supine Quadricep Sets  - 1 x daily - 7 x weekly - 3 sets - 10 reps - Standing Hip Abduction with Counter Support  - 1 x daily - 7 x weekly - 2 sets - 10 reps - Standing Hip Extension with Counter Support  - 1 x daily - 7 x weekly - 2 sets - 10 reps  ASSESSMENT:  CLINICAL IMPRESSION: Mr Trevino has made progress with regard to functional scores and strength as well as his LEFS score.  We have some difficulty with keeping patient on task during treatment sessions.  We had a lengthy discussion about this today.  He would benefit from continuing skilled PT but we will drop frequency to 1 time per week and will do 8 more weeks.    OBJECTIVE IMPAIRMENTS: decreased balance, difficulty walking, decreased ROM,  decreased strength, increased edema, increased fascial restrictions, increased muscle spasms, impaired flexibility, postural dysfunction, obesity, and pain.   ACTIVITY LIMITATIONS: carrying, lifting, bending, standing, squatting, sleeping, stairs, transfers, bed mobility, bathing, toileting, and dressing  PARTICIPATION LIMITATIONS: meal prep, cleaning, laundry, driving, shopping, community activity, yard work, and church  PERSONAL FACTORS: Fitness, Past/current experiences, Profession, and 1-2 comorbidities: obesity and Type 2 diabetes are also affecting patient's functional outcome.   REHAB POTENTIAL: Fair due to end stage  CLINICAL DECISION MAKING: Evolving/moderate complexity  EVALUATION COMPLEXITY: Moderate   GOALS: Goals reviewed with patient? Yes  SHORT TERM GOALS: Target date: 12/20/2023  Pain report to be no greater than 4/10  Baseline: Goal status: Met on 12/29/2023 (pt reports consistently a 4/10)  2.  Patient will be independent with initial HEP  Baseline:  Goal status: Met on 12/08/23   LONG TERM GOALS: Target date: 01/17/2024  Patient to report pain no greater than 2/10  Baseline:  Goal status: In progress  2.  Patient to be independent with advanced HEP including comprehensive gym routine Baseline:  Goal status: Ongoing  3.  Patient to be able to bend, stoop and squat with pain no greater than 2/10  Baseline:  Goal status: Ongoing  4.  Patient to be able to ascend and descend steps without pain or no greater than 2/10  Baseline:  Goal status: Ongoing  5.  LEFS score to improve by 4-5 points Baseline: 35/80 = 43.75% Goal status: Met on 12/27/23  6.  Functional tests to improve by 2-3 seconds Baseline:  Goal status: Met on 01/10/24   PLAN:  PT FREQUENCY: 1-2x/week  PT DURATION: 8 weeks  PLANNED INTERVENTIONS: 97110-Therapeutic exercises, 97530- Therapeutic activity, 97112- Neuromuscular re-education, 97535- Self Care, 02859- Manual therapy,  [id number]- Gait training, [phone]- Canalith repositioning, [id number]- Aquatic Therapy, [phone]- Electrical stimulation (unattended), [phone]- Electrical stimulation (manual), [id number]- Vasopneumatic device, [id number]- Ultrasound, [id number]- Ionotophoresis 4mg /ml Dexamethasone , 79439 (1-2 muscles), 20561 (3+ muscles)- Dry Needling, Patient/Family education, Balance training, Stair training, Taping, Joint mobilization, Vestibular training, DME instructions, Cryotherapy, and Moist heat  PLAN FOR NEXT SESSION: Nustep, progress quad rehab and hip strengthening, continue 1 x per week x 8 weeks.    Delon B. Atoya Andrew, PT 01/17/24 9:59 PM [facility] [address] [place], [place] [address] Phone # [phone] Fax [phone]

## 2024-02-03 ENCOUNTER — Inpatient Hospital Stay: Attending: Hematology

## 2024-02-03 DIAGNOSIS — D519 Vitamin B12 deficiency anemia, unspecified: Secondary | ICD-10-CM | POA: Diagnosis present

## 2024-02-03 DIAGNOSIS — I700 Atherosclerosis of aorta: Secondary | ICD-10-CM

## 2024-02-03 MED ORDER — CYANOCOBALAMIN 1000 MCG/ML IJ SOLN
1000.0000 ug | Freq: Once | INTRAMUSCULAR | Status: AC
  Administered 2024-02-03: 1000 ug via INTRAMUSCULAR
  Filled 2024-02-03: qty 1

## 2024-02-28 ENCOUNTER — Ambulatory Visit: Admitting: Dietician

## 2024-03-05 ENCOUNTER — Inpatient Hospital Stay: Attending: Hematology

## 2024-03-05 ENCOUNTER — Ambulatory Visit: Payer: Self-pay

## 2024-03-05 DIAGNOSIS — D519 Vitamin B12 deficiency anemia, unspecified: Secondary | ICD-10-CM | POA: Diagnosis present

## 2024-03-05 DIAGNOSIS — I700 Atherosclerosis of aorta: Secondary | ICD-10-CM

## 2024-03-05 MED ORDER — CYANOCOBALAMIN 1000 MCG/ML IJ SOLN
1000.0000 ug | Freq: Once | INTRAMUSCULAR | Status: AC
  Administered 2024-03-05: 1000 ug via INTRAMUSCULAR
  Filled 2024-03-05: qty 1

## 2024-03-05 NOTE — Telephone Encounter (Signed)
" °  FYI Only or Action Required?: FYI only for provider: appointment scheduled on 12.23.25.  Patient was last seen in primary care on 11/16/2023 by Jordan, [name], MD.  Called Nurse Triage reporting Urinary Frequency.  Symptoms began several weeks ago.  Interventions attempted: Nothing.  Symptoms are: gradually worsening.  Triage Disposition: See Physician Within 24 Hours  Patient/caregiver understands and will follow disposition?: Yes Message from Leah C sent at 03/05/2024  1:22 PM EST  Summary: Constant Thirst   Reason for Triage:  Constantly thirsty all the time- in the evening patients tries not to drink anything because of going to the bathroom multiple times at night. When patient had blood taken, he felt the blood running down his arm and that alarmed him as well.      Reason for Disposition  Urinating more frequently than usual (i.e., frequency) OR new-onset of the feeling of an urgent need to urinate (i.e., urgency)  Answer Assessment - Initial Assessment Questions 1. REASON FOR CALL: What is your main concern right now?      Excessively thirsty. Unable to quench thirst and urinating a lot  2. ONSET: When did the excessive thirst start?     X Weeks  3. SEVERITY: How bad is the  ?      X more than 8 bottles per day  4. FUNCTIONAL IMPAIRMENT: How have things been going for you overall? Have you had more difficulty than usual doing your normal daily activities? (e.g., self-care, school, work, interactions)      X 2 times per hour pt is urinating        5. FEVER: Do you have a fever?      Denies  6. OTHER SYMPTOMS: Do you have any other new symptoms?       Metallic taste in mouth/dry mouth x 2 weeks, cough w/ thick/white saliva. Pt denies taking any new medications  Pt reports dry mouth, excessive thirst Pt advised to continue drinking fluids. Pt scheduled for a visit on  12.23.25  for further evaluation. Pt agrees with plan of care, will call back  for any worsening symptoms  Answer Assessment - Initial Assessment Questions 1. SYMPTOM: What's the main symptom you're concerned about? (e.g., frequency, incontinence)      Urinary frequency and excessive thirst  2. ONSET: When did the  urinary frequency  start?     X 2 weeks  3. PAIN: Is there any pain? If Yes, ask: How bad is it? (Scale: 1-10; mild, moderate, severe)     Denies  4. CAUSE: What do you think is causing the symptoms?     Pt unsure  Protocols used: No Guideline Available-A-AH, Urinary Symptoms-A-AH  "

## 2024-03-06 ENCOUNTER — Ambulatory Visit (INDEPENDENT_AMBULATORY_CARE_PROVIDER_SITE_OTHER): Admitting: Family Medicine

## 2024-03-06 ENCOUNTER — Encounter: Payer: Self-pay | Admitting: Family Medicine

## 2024-03-06 VITALS — BP 122/80 | HR 90 | Temp 97.9°F | Resp 16 | Ht 71.5 in | Wt 322.2 lb

## 2024-03-06 DIAGNOSIS — N1831 Chronic kidney disease, stage 3a: Secondary | ICD-10-CM

## 2024-03-06 DIAGNOSIS — Z794 Long term (current) use of insulin: Secondary | ICD-10-CM

## 2024-03-06 DIAGNOSIS — I10 Essential (primary) hypertension: Secondary | ICD-10-CM

## 2024-03-06 DIAGNOSIS — E1122 Type 2 diabetes mellitus with diabetic chronic kidney disease: Secondary | ICD-10-CM | POA: Diagnosis not present

## 2024-03-06 LAB — POCT GLYCOSYLATED HEMOGLOBIN (HGB A1C): Hemoglobin A1C: 12.8 % — AB (ref 4.0–5.6)

## 2024-03-06 MED ORDER — BASAGLAR KWIKPEN 100 UNIT/ML ~~LOC~~ SOPN: 20.0000 [IU] | PEN_INJECTOR | Freq: Every day | SUBCUTANEOUS | 3 refills | Status: DC

## 2024-03-06 NOTE — Assessment & Plan Note (Signed)
 BP adequately controlled. Continue amlodipine -olmesartan  10-40 mg daily and HCTZ 12.5 mg daily as well as low-salt diet. Eye exam is overdue. Follow-up in 5 to 6 months.

## 2024-03-06 NOTE — Assessment & Plan Note (Signed)
 Problem is not well-controlled, A1c went from 7.0 to 12.8 today. We discussed possible complications of elevated glucose. He has not been taking Farxiga  or Rybelsus , he did not report side effects. For now recommend Basaglar  20 units daily, he can increase to 25 units if BS 200 or above. A sample of a CGM given, freestyle libre 3+. Regular exercise and healthy diet with avoidance of added sugar food intake is an important part of treatment and recommended. Overdue for eye exam, continue appropriate foot care. Clearly instructed about warning signs. F/U in 4 weeks.

## 2024-03-06 NOTE — Patient Instructions (Addendum)
 A few things to remember from today's visit:  Type 2 diabetes mellitus with other specified complication, without long-term current use of insulin (HCC) - Plan: POC HgB A1c, Microalbumin / creatinine urine ratio, Insulin Glargine  (BASAGLAR  KWIKPEN) 100 UNIT/ML  Essential (primary) hypertension Your blood sugar is very elevated, so we need to start insulin. Basaglar  20 units daily around the same time.  If blood sugars still 200 and above, you can increase to 25 units. I would like to see you back in 4 weeks to reevaluate problem and decide if we can introduce oral medications.  If you need refills for medications you take chronically, please call your pharmacy. Do not use My Chart to request refills or for acute issues that need immediate attention. If you send a my chart message, it may take a few days to be addressed, specially if I am not in the office.  Please be sure medication list is accurate. If a new problem present, please set up appointment sooner than planned today.

## 2024-03-06 NOTE — Progress Notes (Signed)
 "   Chief Complaint  Patient presents with   Acute Visit    Excessive thirst    Discussed the use of AI scribe software for clinical note transcription with the patient, who gave verbal consent to proceed.  History of Present Illness Eric Macdonald is a 68 year old male with past medical history significant for hypertension, knee OA, DM2, and colon cancer here today complaining of excessive thirst for the past couple weeks. He was last seen on 11/16/2023.  He experiences excessive thirst, describing it as constant and requiring him to drink twenty ounces of water or iced tea multiple times a day. He also notes a metallic taste in his mouth and difficulty tasting food. No associated abdominal pain, nausea, vomiting, or changes in bowel habits. + Polyuria. He has not noted dysuria or gross hematuria.  He consumes sugar in coffee and iced tea and finds it challenging to reduce sugar intake.  He is not currently taking medications such as Farxiga  or Rybelsus , he does not elaborate on reason for non compliance.  He has attempted to manage his weight through diet and physical therapy, which he enjoyed but did not result knee pain improvement. He follows with ortho and has discussed the need for knee replacement.  He reports frequent urination, particularly at night.  Diabetes Mellitus II: Dx'ed in 08/2022 with HgA1C 6.9.  He is not checking BS at home. Has not had his eye exam. Negative for numbness extremities or foot ulcers/trauma  Lab Results  Component Value Date   HGBA1C 7.0 (A) 11/16/2023   Renal function has been mildly abnormal. Negative for foam in urine or gross hematuria.  Hypertension: Currently is on amlodipine -olmesartan  10-40 mg daily and HCTZ 12.5 mg daily. He is not monitoring BP regularly. Negative for unusual or severe headache, visual changes, exertional chest pain, dyspnea,  focal weakness, or edema.  Lab Results  Component Value Date   NA 137 01/03/2024   CL  100 01/03/2024   K 3.7 01/03/2024   CO2 31 01/03/2024   BUN 14 01/03/2024   CREATININE 1.48 (H) 01/03/2024   GFRNONAA 51 (L) 01/03/2024   CALCIUM  9.6 01/03/2024   PHOS 3.5 06/21/2020   ALBUMIN 4.2 01/03/2024   GLUCOSE 166 (H) 01/03/2024   Review of Systems  Constitutional:  Negative for activity change, chills and fever.  HENT:  Negative for mouth sores, sore throat and trouble swallowing.   Skin:  Negative for rash.  Neurological:  Negative for syncope and facial asymmetry.  Psychiatric/Behavioral:  Negative for confusion and hallucinations.   See other pertinent positives and negatives in HPI.  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  Tobacco Use   Smoking status: Former    Current packs/day: 0.00    Average packs/day: 0.5 packs/day for 4.0 years (2.0  ttl pk-yrs)    Types: Cigarettes    Start date: 04/27/1982    Quit date: 04/27/1986    Years since quitting: 37.8   Smokeless tobacco: Never  Vaping Use   Vaping status: Never Used  Substance and Sexual Activity   Alcohol use: Yes    Comment: drinks alcohol 2 days per week    Drug use: No   Sexual activity: Yes  Other Topics Concern   Not on file  Social History Narrative   Not on file   Social Drivers of Health   Tobacco Use: Medium Risk (03/06/2024)   Patient History    Smoking Tobacco Use: Former    Smokeless Tobacco Use: Never    Passive Exposure:  Not on file  Financial Resource Strain: Low Risk (10/05/2022)   Overall Financial Resource Strain (CARDIA)    Difficulty of Paying Living Expenses: Not hard at all  Food Insecurity: No Food Insecurity (10/05/2022)   Hunger Vital Sign    Worried About Running Out of Food in the Last Year: Never true    Ran Out of Food in the Last Year: Never true  Transportation Needs: No Transportation Needs (10/05/2022)   PRAPARE - Administrator, Civil Service (Medical): No    Lack of Transportation (Non-Medical): No  Physical Activity: Inactive (10/05/2022)   Exercise Vital Sign    Days of Exercise per Week: 0 days    Minutes of Exercise per Session: 0 min  Stress: No Stress Concern Present (10/05/2022)   [employer] of Occupational Health - Occupational Stress Questionnaire    Feeling of Stress : Not at all  Social Connections: Socially Isolated (10/05/2022)   Social Connection and Isolation Panel    Frequency of Communication with Friends and Family: More than three times a week    Frequency of Social Gatherings with Friends and Family: Twice a week    Attends Religious Services: Never    Database Administrator or Organizations: No    Attends Banker Meetings: Never    Marital Status: Separated  Depression (PHQ2-9): Low Risk (01/03/2024)   Depression (PHQ2-9)    PHQ-2 Score: 0  Alcohol Screen: Low Risk (10/05/2022)   Alcohol Screen    Last Alcohol Screening Score (AUDIT): 1  Housing: Low Risk (10/05/2022)   Housing    Last Housing Risk Score: 0  Utilities: Not At Risk (10/05/2022)   AHC Utilities    Threatened with loss of utilities: No  Health Literacy: Adequate Health Literacy (10/05/2022)   B1300 Health Literacy    Frequency of need for help with medical instructions: Never    Today's Vitals   03/06/24 1448  BP: 122/80  Pulse: 90  Resp: 16  Temp: 97.9 F (36.6 C)  SpO2: 96%  Weight: (!) 322 lb 3.2 oz (146.1 kg)  Height: 5' 11.5 (1.816 m)   Wt  Readings from Last 3 Encounters:  03/06/24 (!) 322 lb 3.2 oz (146.1 kg)  01/03/24 (!) 334 lb 14.4 oz (151.9 kg)  11/16/23 (!) 328 lb 8 oz (149 kg)   Body mass index is 44.31 kg/m.  Physical Exam Vitals and nursing note reviewed.  Constitutional:      General: He is not in acute distress.    Appearance: He is well-developed.  HENT:     Head: Normocephalic and atraumatic.     Mouth/Throat:     Mouth: Mucous membranes are moist.     Pharynx: Oropharynx is clear.  Eyes:  Conjunctiva/sclera: Conjunctivae normal.  Cardiovascular:     Rate and Rhythm: Normal rate and regular rhythm.     Heart sounds: No murmur heard. Pulmonary:     Effort: Pulmonary effort is normal. No respiratory distress.     Breath sounds: Normal breath sounds.  Abdominal:     Palpations: Abdomen is soft. There is no mass.     Tenderness: There is no abdominal tenderness.  Skin:    General: Skin is warm.     Findings: No erythema or rash.  Neurological:     Mental Status: He is alert and oriented to person, place, and time.     Cranial Nerves: No cranial nerve deficit.     Gait: Gait normal.  Psychiatric:        Mood and Affect: Mood and affect normal.    ASSESSMENT AND PLAN:  [name] was seen today for acute visit.  Diagnoses and all orders for this visit:  Orders Placed This Encounter  Procedures   Microalbumin / creatinine urine ratio   POC HgB A1c   Lab Results  Component Value Date   HGBA1C 12.8 (A) 03/06/2024   Type 2 diabetes mellitus with stage 3a chronic kidney disease, with long-term current use of insulin (HCC) Assessment & Plan: Problem is not well-controlled, A1c went from 7.0 to 12.8 today. We discussed possible complications of elevated glucose. He has not been taking Farxiga  or Rybelsus , he did not report side effects. For now recommend Basaglar  20 units daily, he can increase to 25 units if BS 200 or above. A sample of a CGM given, freestyle libre 3+. Regular  exercise and healthy diet with avoidance of added sugar food intake is an important part of treatment and recommended. Overdue for eye exam, continue appropriate foot care. Clearly instructed about warning signs. F/U in 4 weeks.  Orders: -     POCT glycosylated hemoglobin (Hb A1C) -     Microalbumin / creatinine urine ratio; Future -     Basaglar  KwikPen; Inject 20 Units into the skin at bedtime.  Dispense: 6 mL; Refill: 3  Essential (primary) hypertension Assessment & Plan: BP adequately controlled. Continue amlodipine -olmesartan  10-40 mg daily and HCTZ 12.5 mg daily as well as low-salt diet. Eye exam is overdue. Follow-up in 5 to 6 months.   Return in about 4 weeks (around 04/03/2024).  Kamani Lewter, MD [facility]. [place] office.      [1]  Current Outpatient Medications on File Prior to Visit  Medication Sig Dispense Refill   amLODipine -olmesartan  (AZOR ) 10-40 MG tablet Take 1 tablet by mouth daily. 90 tablet 2   Blood Glucose Monitoring Suppl (ACCU-CHEK AVIVA PLUS) w/Device KIT As directed. 1 kit 0   cyanocobalamin  (VITAMIN B12) 1000 MCG tablet Take 1 tablet (1,000 mcg total) by mouth daily. 90 tablet 1   Glucose Blood (BLOOD GLUCOSE TEST STRIPS 333) STRP Check blood sugar daily. 100 strip 2   hydrochlorothiazide  (HYDRODIURIL ) 12.5 MG tablet Take 1 tablet (12.5 mg total) by mouth daily. 90 tablet 1   rosuvastatin  (CRESTOR ) 10 MG tablet Take 1 tablet (10 mg total) by mouth daily. 90 tablet 2   dapagliflozin  propanediol (FARXIGA ) 10 MG TABS tablet Take 1 tablet (10 mg total) by mouth daily before breakfast. (Patient not taking: Reported on 03/06/2024) 30 tablet 3   Semaglutide  (RYBELSUS ) 3 MG TABS Take 1 tablet (3 mg total) by mouth daily. (Patient not taking: Reported on 03/06/2024) 90 tablet 1   [DISCONTINUED] cloNIDine  (CATAPRES )  0.1 MG tablet Take 0.1 mg by mouth 2 (two) times daily.      No current facility-administered medications on file prior to visit.   [2]  Allergies Allergen Reactions   Other     Blood products Refusal    "

## 2024-03-12 ENCOUNTER — Telehealth: Payer: Self-pay

## 2024-03-12 NOTE — Telephone Encounter (Signed)
 Please Advise ?  Copied from CRM #[id number]. Topic: Clinical - Prescription Issue >> Mar 09, 2024  4:22 PM Ashley R wrote: Reason for CRM: insulin pens sent to [facility] on [address] are not in stock and insurance will only cover lantus

## 2024-03-13 ENCOUNTER — Other Ambulatory Visit: Payer: Self-pay

## 2024-03-13 ENCOUNTER — Telehealth: Payer: Self-pay

## 2024-03-13 DIAGNOSIS — E1122 Type 2 diabetes mellitus with diabetic chronic kidney disease: Secondary | ICD-10-CM

## 2024-03-13 MED ORDER — INSULIN GLARGINE 100 UNIT/ML ~~LOC~~ SOLN: 20.0000 [IU] | Freq: Every day | SUBCUTANEOUS | 3 refills | Status: DC

## 2024-03-13 MED ORDER — INSULIN GLARGINE 100 UNIT/ML SOLOSTAR PEN: 20.0000 [IU] | PEN_INJECTOR | Freq: Every day | SUBCUTANEOUS | 3 refills | Status: AC

## 2024-03-13 NOTE — Telephone Encounter (Signed)
 Please advise in the absent of Dr. Jordan ?  Copied from CRM #[id number]. Topic: Clinical - Prescription Issue >> Mar 12, 2024  9:38 AM Tinnie BROCKS wrote: Reason for CRM: Pt calling to let us  know that currently prescribed insulin pen will not be covered by insurance. Per pharmacy, his insurance will cover Lantus. Please give pt a call back at [phone]  [id number] - [place], [place] - [address] AT [facility] [address] [place] [address] Phone: [phone] Fax: [phone]

## 2024-03-13 NOTE — Telephone Encounter (Signed)
Lantus has been sent to the pharmacy

## 2024-03-13 NOTE — Telephone Encounter (Signed)
 Copied from CRM [id number]. Topic: Clinical - Prescription Issue >> Mar 13, 2024  3:03 PM Franky GRADE wrote: Reason for CRM: Patient is calling because the pharmacy informed patient they received the Insulin prescription for the vials but he needs the pens, he's never used the vials.

## 2024-03-13 NOTE — Telephone Encounter (Signed)
 Patient lantus pen has been sent in to the pharmacy

## 2024-03-21 ENCOUNTER — Telehealth: Payer: Self-pay

## 2024-03-21 NOTE — Telephone Encounter (Signed)
 Urinary frequency is most likely because elevated BS. Is he checking his BS's? Which insulin  is covered by his insurance?  He did not collect urine last visit. We could add a UA with reflex Ucx. Thanks, BJ

## 2024-03-21 NOTE — Telephone Encounter (Signed)
 LVMTRC

## 2024-03-21 NOTE — Telephone Encounter (Signed)
 While you were out we changed the patients rx to

## 2024-03-21 NOTE — Telephone Encounter (Signed)
 Please Advise?   Copied from CRM #[id number]. Topic: Clinical - Medical Advice >> Mar 21, 2024  1:07 PM Victoria A wrote: Reason for CRM: Patient is using Lantus  insulin  and he said that the insulin  that was prescribed previously is not covered by his insurance. Patient wants to know if there is a different with insulins and is the insulin  causing him to urinate throughout the night -please contact at [phone]

## 2024-03-22 ENCOUNTER — Telehealth: Payer: Self-pay | Admitting: *Deleted

## 2024-03-22 NOTE — Telephone Encounter (Signed)
 Agent will let pt know Eric Macdonald is not in the office today

## 2024-03-22 NOTE — Telephone Encounter (Signed)
 Copied from CRM #[id number]. Topic: Clinical - Prescription Issue >> Mar 22, 2024  3:18 PM Ivette P wrote: Reason for CRM: Pt was prescribed insulin  glargine (LANTUS ) 100 UNIT/ML Solostar Pen and pt was not able to afford the cost of the insulin  that was prescribed. Pt wants to notify that he is not taking it.   Pt is taking the insulin  that he can afford.   Lantus  is covered by insurance but the one he is prescribed by Dr. Jordan basaglar  insulin  is not covered.

## 2024-03-23 ENCOUNTER — Ambulatory Visit

## 2024-03-23 DIAGNOSIS — E1122 Type 2 diabetes mellitus with diabetic chronic kidney disease: Secondary | ICD-10-CM

## 2024-03-23 DIAGNOSIS — Z794 Long term (current) use of insulin: Secondary | ICD-10-CM | POA: Diagnosis not present

## 2024-03-23 DIAGNOSIS — N1831 Chronic kidney disease, stage 3a: Secondary | ICD-10-CM

## 2024-03-23 MED ORDER — GLUCOSE BLOOD VI STRP: 1.0000 | ORAL_STRIP | Freq: Three times a day (TID) | 12 refills | Status: AC

## 2024-03-23 MED ORDER — ACCU-CHEK SOFTCLIX LANCET DEV KIT: 1.0000 | PACK | Freq: Three times a day (TID) | 0 refills | Status: AC

## 2024-03-23 MED ORDER — ACCU-CHEK SOFTCLIX LANCETS MISC: 1.0000 | Freq: Three times a day (TID) | 12 refills | Status: AC

## 2024-03-23 NOTE — Progress Notes (Signed)
 Patient came in today to figure out how to use his diabetic machine. Patient phone was not compatible with Libre 3. We sent in a Accu Check kit with lancets and test strips. Checked the patients sugar and it was 486. Patient explained that maybe he has not been taking his insulin  correctly so we agreed that he needs to come in on Monday so we can show him how to use his insulin  and show him how to use his Accu chek diabetic machine. Patient agreed and was sch for Monday. Patient denies any nausea and vomiting at this time.

## 2024-03-23 NOTE — Telephone Encounter (Signed)
 Patient is taking LANTUS  insulin . He is coming in at 430pm 03/24/23 so I can help him figure out how to work his BS machine.

## 2024-03-23 NOTE — Telephone Encounter (Signed)
 Patient has NOT been checking his BS levels. I asked the patient to come in at 430 pm on 03/24/23 so I can show him how to use his machine because patient stated he does not check his BS because he does not know how to work the machine.

## 2024-03-23 NOTE — Telephone Encounter (Signed)
 If Lantus  is covered he can have this sent to his pharmacy, same dosis she was taking of generic insulin  glargine. He can be referred to pharmacist, Jon Lindau, to see if he qualifies for pt assistance for medications. Thanks, BJ

## 2024-03-23 NOTE — Addendum Note (Signed)
 Addended by: VANICE LUCIENNE PARAS on: 03/23/2024 04:58 PM   Modules accepted: Orders

## 2024-03-26 ENCOUNTER — Other Ambulatory Visit: Payer: Self-pay

## 2024-03-26 ENCOUNTER — Ambulatory Visit

## 2024-03-26 DIAGNOSIS — N1831 Chronic kidney disease, stage 3a: Secondary | ICD-10-CM

## 2024-03-26 DIAGNOSIS — E1122 Type 2 diabetes mellitus with diabetic chronic kidney disease: Secondary | ICD-10-CM | POA: Diagnosis not present

## 2024-03-26 DIAGNOSIS — E1169 Type 2 diabetes mellitus with other specified complication: Secondary | ICD-10-CM

## 2024-03-26 DIAGNOSIS — Z794 Long term (current) use of insulin: Secondary | ICD-10-CM

## 2024-03-26 LAB — COMPREHENSIVE METABOLIC PANEL WITH GFR
ALT: 38 U/L (ref 3–53)
AST: 28 U/L (ref 5–37)
Albumin: 4.1 g/dL (ref 3.5–5.2)
Alkaline Phosphatase: 49 U/L (ref 39–117)
BUN: 23 mg/dL (ref 6–23)
CO2: 27 meq/L (ref 19–32)
Calcium: 9.2 mg/dL (ref 8.4–10.5)
Chloride: 93 meq/L — ABNORMAL LOW (ref 96–112)
Creatinine, Ser: 1.73 mg/dL — ABNORMAL HIGH (ref 0.40–1.50)
GFR: 39.89 mL/min — ABNORMAL LOW
Glucose, Bld: 404 mg/dL — ABNORMAL HIGH (ref 70–99)
Potassium: 3.6 meq/L (ref 3.5–5.1)
Sodium: 131 meq/L — ABNORMAL LOW (ref 135–145)
Total Bilirubin: 0.6 mg/dL (ref 0.2–1.2)
Total Protein: 8 g/dL (ref 6.0–8.3)

## 2024-03-26 LAB — LIPID PANEL
Cholesterol: 133 mg/dL (ref 28–200)
HDL: 44.1 mg/dL
LDL Cholesterol: 58 mg/dL (ref 10–99)
NonHDL: 88.88
Total CHOL/HDL Ratio: 3
Triglycerides: 152 mg/dL — ABNORMAL HIGH (ref 10.0–149.0)
VLDL: 30.4 mg/dL (ref 0.0–40.0)

## 2024-03-26 LAB — HEMOGLOBIN A1C: Hgb A1c MFr Bld: 15.8 % — ABNORMAL HIGH (ref 4.6–6.5)

## 2024-03-26 LAB — GLUCOSE, POCT (MANUAL RESULT ENTRY)
POC Glucose: 486 mg/dL — AB (ref 70–99)
POC Glucose: 386 mg/dL — AB (ref 70–99)

## 2024-03-26 LAB — POCT URINALYSIS DIPSTICK
Bilirubin, UA: NEGATIVE
Blood, UA: NEGATIVE
Glucose, UA: POSITIVE — AB
Ketones, UA: POSITIVE
Leukocytes, UA: NEGATIVE
Nitrite, UA: NEGATIVE
Protein, UA: NEGATIVE
Spec Grav, UA: 1.02
Urobilinogen, UA: 0.2 U/dL
pH, UA: 6

## 2024-03-26 LAB — MICROALBUMIN / CREATININE URINE RATIO
Creatinine,U: 126.7 mg/dL
Microalb Creat Ratio: UNDETERMINED mg/g (ref 0.0–30.0)
Microalb, Ur: <0.7 mg/dL

## 2024-03-26 LAB — VITAMIN B12: Vitamin B-12: 1043 pg/mL — ABNORMAL HIGH (ref 211–911)

## 2024-03-26 MED ORDER — ACCU-CHEK AVIVA PLUS W/DEVICE KIT: PACK | 0 refills | Status: AC

## 2024-03-26 NOTE — Progress Notes (Signed)
 Very pleasant patient presents for a nurse visit for education involving the use of his glucometer and insulin  pens. Patient is currently prescribed Lantus  25 Units at bedtime and uses the Accu-Check glucometer. Education provided to the patient regarding hand hygiene and preparation of the finger. Educated the patient on how to insert test strips into monitor and where to apply droplet of blood on test strip. Educated patient on how to use the lancet pen and proper disposal of needles. Education provided for patient to cleanse finger with alcohol wipe, dry area using clean cotton ball, use the lateral sides of fingers to do finger stick for testing, and educated patient not to use first drop of blood. Educated patient to test glucose 3 times a days before each meal and log each reading to bring to appts for PCP to evaluate. Educated patient to rotate sites of fingers. Patient voiced understanding of education for glucometer. Glucose today 386. Patient showed this RN how he has been using his Lantus  pen. After evaluation of patient method, this RN advised the patient he was using the pen correctly and answered questions. Advised patient to rotate sites of injection. Patient understands areas acceptable for injection. Patient is knowledgeable regarding refrigeration of medication. Patient voiced understanding of above and will stay to see pharmacist today.

## 2024-03-26 NOTE — Progress Notes (Signed)
" ° °  03/26/2024  Patient ID: Eric Macdonald, male   DOB: 29-Nov-1955, 69 y.o.   MRN: [id number]  Patient presented in office today for follow up on diabetes.  New to insulin  use and using finger prick device. Office RN has reviewed diabetes diet and how to use lantus  insulin .  Has BG finger prick testing supplies but no meter, unclear why he was not given meter at pharmacy. He plans to go to pharmacy after leaving office to follow up.  Plan: -Provided free voucher to Accu Chek Guide meter to get at pharmacy -Instructed patient to use insulin  and device as instructed, set up a one week follow up to check on correct use. Asked patient to reach out sooner with questions/concerns. -At next visit, plan to discuss hx of farxiga  and rybelsus  and why it was stopped, see if there was cost/access issues. -Could consider CGM device to monitor sugars if affordable and patient agreeable in future "

## 2024-03-26 NOTE — Addendum Note (Signed)
 Addended by: VANICE LUCIENNE PARAS on: 03/26/2024 10:03 AM   Modules accepted: Orders

## 2024-04-02 ENCOUNTER — Ambulatory Visit

## 2024-04-02 DIAGNOSIS — E1122 Type 2 diabetes mellitus with diabetic chronic kidney disease: Secondary | ICD-10-CM

## 2024-04-02 MED ORDER — OZEMPIC (0.25 OR 0.5 MG/DOSE) 2 MG/3ML ~~LOC~~ SOPN: 0.5000 mg | PEN_INJECTOR | SUBCUTANEOUS | 0 refills | Status: AC

## 2024-04-02 NOTE — Progress Notes (Signed)
" ° °  04/02/2024 Name: Iosefa Weintraub MRN: [id number] DOB: 03/11/1956  Chief Complaint  Patient presents with   Medication Management   Diabetes    Burdett Pinzon is a 69 y.o. year old male who was referred for medication management by their primary care provider, Jordan, [name], MD. They presented for a face to face visit today.   They were referred to the pharmacist by their PCP for assistance in managing diabetes and medication access    Subjective:  Care Team: Primary Care Provider: Jordan, [name], MD    Medication Access/Adherence  Current Pharmacy:  [facility] [phone] - [place], [place] - [address] AT [facility] OF [address] [place] [address] Phone: [phone] Fax: [phone]   Patient reports affordability concerns with their medications: No  Patient reports access/transportation concerns to their pharmacy: No  Patient reports adherence concerns with their medications:  No     Diabetes:  Current medications: Lantus  25 units daily Medications tried in the past: Rybelsus  and Farxiga  - both never started, unclear why, patient not sure  Current glucose readings: checking BID-TID, all elevated >250, lots of HIGH readings where sugars are too high to be read. 375 this am   Observed patterns:  Patient denies hypoglycemic s/sx including dizziness, shakiness, sweating. Patient reports hyperglycemic symptoms including polyuria, polydipsia,  Current meal patterns:  - working on a more low carb diet slowly    Macrovascular and Microvascular Risk Reduction:  Statin? yes (crestor ); ACEi/ARB? yes (amlodipine -olmesartan ) Last urinary albumin/creatinine ratio:  Lab Results  Component Value Date   MICRALBCREAT Unable to calculate 03/26/2024   Last eye exam:   Last foot exam: 11/16/2023 Tobacco Use:  Tobacco Use: Medium Risk (03/06/2024)   Patient History    Smoking Tobacco Use: Former     Smokeless Tobacco Use: Never    Passive Exposure: Not on file     Objective:  Lab Results  Component Value Date   HGBA1C 15.8 (H) 03/26/2024    Lab Results  Component Value Date   CREATININE 1.73 (H) 03/26/2024   BUN 23 03/26/2024   NA 131 (L) 03/26/2024   K 3.6 03/26/2024   CL 93 (L) 03/26/2024   CO2 27 03/26/2024    Lab Results  Component Value Date   CHOL 133 03/26/2024   HDL 44.10 03/26/2024   LDLCALC 58 03/26/2024   TRIG 152.0 (H) 03/26/2024   CHOLHDL 3 03/26/2024    Medications Reviewed Today   Medications were not reviewed in this encounter       Assessment/Plan:   Diabetes: - Currently uncontrolled; goal A1c <7%. Cardiorenal risk reduction is opportunities for improvement.. Blood pressure is at goal <130/80. LDL is at goal.  - Reviewed long term cardiovascular and renal outcomes of uncontrolled blood sugar. and Reviewed dietary modifications including low carb diet. - Recommend to start Ozempic  0.25mg  once weekly . Reviewed how to use and possible side effects. Provided sample box in office today. Sending in rx for 0.5mg  once weekly to start after samples finished if affordable. *If not affordable, screened and should qualify for Trulicity PAP if needed. -INCREASE lantus  to 30 units daily  Follow Up Plan: 2 weeks  Jon VEAR Lindau, PharmD Clinical Pharmacist [phone]    "

## 2024-04-05 ENCOUNTER — Inpatient Hospital Stay: Attending: Hematology

## 2024-04-05 DIAGNOSIS — I700 Atherosclerosis of aorta: Secondary | ICD-10-CM

## 2024-04-05 DIAGNOSIS — D519 Vitamin B12 deficiency anemia, unspecified: Secondary | ICD-10-CM

## 2024-04-05 MED ORDER — CYANOCOBALAMIN 1000 MCG/ML IJ SOLN
1000.0000 ug | Freq: Once | INTRAMUSCULAR | Status: AC
  Administered 2024-04-05: 1000 ug via INTRAMUSCULAR
  Filled 2024-04-05: qty 1

## 2024-04-09 ENCOUNTER — Other Ambulatory Visit: Payer: Self-pay | Admitting: Family Medicine

## 2024-04-10 ENCOUNTER — Ambulatory Visit: Payer: Self-pay | Admitting: Family Medicine

## 2024-04-10 NOTE — Telephone Encounter (Signed)
 Spoke with the patient and he has been advised to increase his lantus  to 35 u patient voiced understanding. Patient informed us  of his BS   04/09/24 297 M - BB 477 A -AL  04/10/24 262 M - BB

## 2024-04-16 ENCOUNTER — Ambulatory Visit

## 2024-04-17 ENCOUNTER — Other Ambulatory Visit: Payer: Self-pay | Admitting: Family Medicine

## 2024-04-19 ENCOUNTER — Encounter: Admitting: Dietician

## 2024-04-19 ENCOUNTER — Encounter: Payer: Self-pay | Admitting: Dietician

## 2024-04-19 VITALS — Wt 306.1 lb

## 2024-04-19 DIAGNOSIS — Z794 Long term (current) use of insulin: Secondary | ICD-10-CM

## 2024-04-19 NOTE — Progress Notes (Signed)
 " Diabetes Self-Management Education  Visit Type: Follow-up  Appt. Start Time: 1430 Appt. End Time: 1510  04/19/2024  [name], identified by name and date of birth, is a 69 y.o. male with a diagnosis of Diabetes: type 2  .   ASSESSMENT  History includes: type 2 diabetes, arthritis, cancer, HTN Labs noted: 03/26/24: A1c 15.8%, GFR 39.89 Medications include: reviewed, farxiga , insulin  lantus , semaglutide  Supplements: goes to cancer center once a month for vitamin B12 injection   Weight History 04/19/2024: 306.1 lb 11/01/2023: 326.8 lb 09/14/2023: 323.5 lb 06/15/2023: 311.8 lb 03/17/2023: 313 lb 11/18/2022: 319 lb   Pt reports he has been very sick in late December/early January, with high blood glucose. Pt reports he was waking up frequently in the night to urinate, drinking a lot of water, feeling ill. Pt reports he then had to start ozempic  and lantus  (reports he does 35 units at night). Pt reports he started checking his blood glucose more consistently and at first his meter would just say 'HI' but has since regulated some, with this morning fasting being 168mg /dL.   Pt reports since starting ozempic  his appetite has decreased. Pt reports him getting sick inspired him to make diet changes, and his daughter helped by sending groceries to his house (mostly protein/vegetable meals, and snacks such as greek yogurt). Pt reports he has been eating lots of chicken/salmon salads.    Assessment/Continuation of Previous Goals:   Goal: Do the stationary bike 3 times a week for 10-20 minutes. - did not assess goal.    Goal: Drink 2 1/2 of your cups of water daily. - goal met, continue!   Goal: Try to use 1 spoon of sugar in your coffee instead of 2. - goal met, drinking coffee with only creamer right now.   Weight (!) 306 lb 1.6 oz (138.8 kg). Body mass index is 42.1 kg/m.   Diabetes Self-Management Education - 04/19/24 1424       Visit Information   Visit Type Follow-up       Psychosocial Assessment   Patient Belief/Attitude about Diabetes Motivated to manage diabetes    What is the hardest part about your diabetes right now, causing you the most concern, or is the most worrisome to you about your diabetes?   Making healty food and beverage choices;Being active;Getting support / problem solving    Self-care barriers None    Self-management support Doctor's office    Other persons present Patient    Patient Concerns Nutrition/Meal planning;Healthy Lifestyle    Special Needs None    Preferred Learning Style No preference indicated    Learning Readiness Ready      Pre-Education Assessment   Patient understands the diabetes disease and treatment process. Needs Review    Patient understands incorporating nutritional management into lifestyle. Needs Review    Patient undertands incorporating physical activity into lifestyle. Needs Review    Patient understands using medications safely. Needs Review    Patient understands monitoring blood glucose, interpreting and using results Needs Review    Patient understands prevention, detection, and treatment of acute complications. Needs Review    Patient understands prevention, detection, and treatment of chronic complications. Needs Review    Patient understands how to develop strategies to address psychosocial issues. Needs Review    Patient understands how to develop strategies to promote health/change behavior. Needs Review      Complications   Last HgB A1C per patient/outside source 15.8 %    How often do you  check your blood sugar? 1-2 times/day    Fasting Blood glucose range (mg/dL) 869-820      Dietary Intake   Breakfast cabbage    Snack (morning) none OR yogurt    Lunch salad with feta, chicken    Snack (afternoon) none    Dinner stir fry veggies with pork chop    Snack (evening) none    Beverage(s) seltzer water, 2-3 glasses of water, coffee      Activity / Exercise   Activity / Exercise Type ADL's       Patient Education   Previous Diabetes Education Yes    Disease Pathophysiology Explored patient's options for treatment of their diabetes    Healthy Eating Role of diet in the treatment of diabetes and the relationship between the three main macronutrients and blood glucose level;Plate Method;Reviewed blood glucose goals for pre and post meals and how to evaluate the patients' food intake on their blood glucose level.;Meal options for control of blood glucose level and chronic complications.;Meal timing in regards to the patients' current diabetes medication.    Being Active Identified with patient nutritional and/or medication changes necessary with exercise.    Medications Reviewed patients medication for diabetes, action, purpose, timing of dose and side effects.    Monitoring Identified appropriate SMBG and/or A1C goals.    Chronic complications Relationship between chronic complications and blood glucose control;Identified and discussed with patient  current chronic complications    Diabetes Stress and Support Identified and addressed patients feelings and concerns about diabetes;Worked with patient to identify barriers to care and solutions    Lifestyle and Health Coping Lifestyle issues that need to be addressed for better diabetes care      Individualized Goals (developed by patient)   Nutrition General guidelines for healthy choices and portions discussed    Physical Activity Exercise 3-5 times per week;15 minutes per day    Medications take my medication as prescribed    Monitoring  Test my blood glucose as discussed    Problem Solving Eating Pattern    Reducing Risk examine blood glucose patterns;do foot checks daily;treat hypoglycemia with 15 grams of carbs if blood glucose less than 70mg /dL    Health Coping Ask for help with psychological, social, or emotional issues      Patient Self-Evaluation of Goals - Patient rates self as meeting previously set goals (% of time)   Nutrition  50 - 75 % (half of the time)    Physical Activity 50 - 75 % (half of the time)    Medications >75% (most of the time)    Monitoring >75% (most of the time)    Problem Solving and behavior change strategies  50 - 75 % (half of the time)    Reducing Risk (treating acute and chronic complications) 50 - 75 % (half of the time)    Health Coping 50 - 75 % (half of the time)      Post-Education Assessment   Patient understands the diabetes disease and treatment process. Comprehends key points    Patient understands incorporating nutritional management into lifestyle. Comprehends key points    Patient undertands incorporating physical activity into lifestyle. Comprehends key points    Patient understands using medications safely. Comphrehends key points    Patient understands monitoring blood glucose, interpreting and using results Comprehends key points    Patient understands prevention, detection, and treatment of acute complications. Comprehends key points    Patient understands prevention, detection, and treatment of chronic complications.  Comprehends key points    Patient understands how to develop strategies to address psychosocial issues. Comprehends key points    Patient understands how to develop strategies to promote health/change behavior. Comprehends key points      Outcomes   Expected Outcomes Demonstrated interest in learning. Expect positive outcomes    Future DMSE 4-6 wks    Program Status Not Completed      Subsequent Visit   Since your last visit have you continued or begun to take your medications as prescribed? Yes    Since your last visit, are you checking your blood glucose at least once a day? Yes          Individualized Plan for Diabetes Self-Management Training:   Learning Objective:  Patient will have a greater understanding of diabetes self-management. Patient education plan is to attend individual and/or group sessions per assessed needs and concerns.   Plan:    Aim to eat within 1-2 hours of waking up and every 3-5 hours following.   At meals, aim to include 1/2 plate non-starchy vegetables, 1/4 plate protein, and 1/4 plate complex carbs.   Assessment/Continuation of Previous Goals:   Goal: Do the stationary bike 3 times a week for 10-20 minutes. - did not assess goal.    Goal: Drink 2 1/2 of your cups of water daily. - goal met, continue!   Goal: Try to use 1 spoon of sugar in your coffee instead of 2. - goal met, drinking coffee with only creamer right now.   Expected Outcomes:  Demonstrated interest in learning. Expect positive outcomes  Education material provided: ADA - How to Thrive: A Guide for Your Journey with Diabetes and My Plate  If problems or questions, patient to contact team via:  Phone  Future DSME appointment: 4-6 wks "

## 2024-04-23 ENCOUNTER — Ambulatory Visit

## 2024-05-07 ENCOUNTER — Inpatient Hospital Stay: Attending: Hematology

## 2024-05-31 ENCOUNTER — Encounter: Admitting: Dietician

## 2024-06-04 ENCOUNTER — Inpatient Hospital Stay: Attending: Hematology

## 2024-07-03 ENCOUNTER — Inpatient Hospital Stay

## 2024-07-03 ENCOUNTER — Inpatient Hospital Stay: Admitting: Hematology

## 2024-07-03 ENCOUNTER — Inpatient Hospital Stay: Attending: Hematology
